# Patient Record
Sex: Male | Born: 1935 | Race: White | Hispanic: No | Marital: Married | Smoking: Former smoker
Health system: Southern US, Community
[De-identification: ages and names within clinical notes are randomized; demographics above are authoritative.]

## PROBLEM LIST (undated history)

## (undated) DIAGNOSIS — F32A Depression, unspecified: Secondary | ICD-10-CM

## (undated) DIAGNOSIS — H9190 Unspecified hearing loss, unspecified ear: Secondary | ICD-10-CM

## (undated) DIAGNOSIS — R42 Dizziness and giddiness: Secondary | ICD-10-CM

## (undated) DIAGNOSIS — I10 Essential (primary) hypertension: Secondary | ICD-10-CM

## (undated) DIAGNOSIS — F329 Major depressive disorder, single episode, unspecified: Secondary | ICD-10-CM

## (undated) DIAGNOSIS — N400 Enlarged prostate without lower urinary tract symptoms: Secondary | ICD-10-CM

## (undated) DIAGNOSIS — I4891 Unspecified atrial fibrillation: Secondary | ICD-10-CM

## (undated) DIAGNOSIS — G4730 Sleep apnea, unspecified: Secondary | ICD-10-CM

## (undated) DIAGNOSIS — R011 Cardiac murmur, unspecified: Secondary | ICD-10-CM

## (undated) DIAGNOSIS — E7800 Pure hypercholesterolemia, unspecified: Secondary | ICD-10-CM

## (undated) DIAGNOSIS — M1990 Unspecified osteoarthritis, unspecified site: Secondary | ICD-10-CM

## (undated) DIAGNOSIS — J189 Pneumonia, unspecified organism: Secondary | ICD-10-CM

## (undated) HISTORY — DX: Depression, unspecified: F32.A

## (undated) HISTORY — DX: Major depressive disorder, single episode, unspecified: F32.9

## (undated) HISTORY — DX: Unspecified atrial fibrillation: I48.91

## (undated) HISTORY — PX: COLONOSCOPY: SHX174

## (undated) HISTORY — PX: JOINT REPLACEMENT: SHX530

## (undated) HISTORY — DX: Benign prostatic hyperplasia without lower urinary tract symptoms: N40.0

## (undated) HISTORY — DX: Essential (primary) hypertension: I10

## (undated) HISTORY — DX: Pure hypercholesterolemia, unspecified: E78.00

---

## 2007-08-28 ENCOUNTER — Ambulatory Visit: Payer: Self-pay | Admitting: Internal Medicine

## 2007-11-25 ENCOUNTER — Ambulatory Visit: Payer: Self-pay | Admitting: Cardiovascular Disease

## 2008-01-21 ENCOUNTER — Emergency Department: Payer: Self-pay | Admitting: Emergency Medicine

## 2008-02-04 ENCOUNTER — Ambulatory Visit: Payer: Self-pay | Admitting: Urology

## 2008-08-10 ENCOUNTER — Ambulatory Visit: Payer: Self-pay | Admitting: Urology

## 2008-08-28 ENCOUNTER — Ambulatory Visit: Payer: Self-pay | Admitting: Urology

## 2008-09-01 ENCOUNTER — Ambulatory Visit: Payer: Self-pay | Admitting: Urology

## 2008-12-23 ENCOUNTER — Ambulatory Visit: Payer: Self-pay | Admitting: Urology

## 2010-03-31 ENCOUNTER — Ambulatory Visit: Payer: Self-pay | Admitting: Otolaryngology

## 2010-04-04 LAB — PATHOLOGY REPORT

## 2011-08-24 ENCOUNTER — Ambulatory Visit: Payer: Self-pay | Admitting: Unknown Physician Specialty

## 2011-10-05 ENCOUNTER — Ambulatory Visit: Payer: Self-pay | Admitting: Unknown Physician Specialty

## 2011-10-05 LAB — URINALYSIS, COMPLETE
Glucose,UR: NEGATIVE mg/dL (ref 0–75)
Ketone: NEGATIVE
Nitrite: NEGATIVE
Ph: 6 (ref 4.5–8.0)
Protein: NEGATIVE
RBC,UR: <1 /HPF (ref 0–5)
WBC UR: <1 /HPF (ref 0–5)

## 2011-10-05 LAB — BASIC METABOLIC PANEL
Anion Gap: 10 (ref 7–16)
BUN: 20 mg/dL — ABNORMAL HIGH (ref 7–18)
Chloride: 105 mmol/L (ref 98–107)
Co2: 26 mmol/L (ref 21–32)
Creatinine: 1.41 mg/dL — ABNORMAL HIGH (ref 0.60–1.30)
EGFR (Non-African Amer.): 52 — ABNORMAL LOW
Glucose: 90 mg/dL (ref 65–99)
Potassium: 4.4 mmol/L (ref 3.5–5.1)
Sodium: 141 mmol/L (ref 136–145)

## 2011-10-05 LAB — CBC
HCT: 42.2 % (ref 40.0–52.0)
MCHC: 33.8 g/dL (ref 32.0–36.0)
MCV: 92 fL (ref 80–100)
Platelet: 171 10*3/uL (ref 150–440)
RDW: 13.9 % (ref 11.5–14.5)
WBC: 5.8 10*3/uL (ref 3.8–10.6)

## 2011-10-12 ENCOUNTER — Ambulatory Visit: Payer: Self-pay | Admitting: Unknown Physician Specialty

## 2011-10-12 ENCOUNTER — Emergency Department: Payer: Self-pay | Admitting: Emergency Medicine

## 2011-10-12 LAB — URINALYSIS, COMPLETE
Bilirubin,UR: NEGATIVE
Glucose,UR: NEGATIVE mg/dL (ref 0–75)
Ketone: NEGATIVE
Leukocyte Esterase: NEGATIVE
Ph: 6 (ref 4.5–8.0)
Protein: NEGATIVE
RBC,UR: 22 /HPF (ref 0–5)

## 2012-01-04 ENCOUNTER — Ambulatory Visit: Payer: Self-pay | Admitting: Urology

## 2012-01-04 LAB — BASIC METABOLIC PANEL
BUN: 18 mg/dL (ref 7–18)
Calcium, Total: 9.1 mg/dL (ref 8.5–10.1)
Chloride: 106 mmol/L (ref 98–107)
Co2: 27 mmol/L (ref 21–32)
Creatinine: 1.28 mg/dL (ref 0.60–1.30)
EGFR (Non-African Amer.): 55 — ABNORMAL LOW
Glucose: 127 mg/dL — ABNORMAL HIGH (ref 65–99)
Potassium: 4.5 mmol/L (ref 3.5–5.1)

## 2012-01-16 ENCOUNTER — Ambulatory Visit: Payer: Self-pay | Admitting: Urology

## 2012-05-10 DIAGNOSIS — R5383 Other fatigue: Secondary | ICD-10-CM | POA: Insufficient documentation

## 2012-05-10 DIAGNOSIS — IMO0002 Reserved for concepts with insufficient information to code with codable children: Secondary | ICD-10-CM | POA: Insufficient documentation

## 2012-05-10 DIAGNOSIS — N401 Enlarged prostate with lower urinary tract symptoms: Secondary | ICD-10-CM | POA: Insufficient documentation

## 2012-05-10 DIAGNOSIS — N200 Calculus of kidney: Secondary | ICD-10-CM | POA: Insufficient documentation

## 2012-05-10 DIAGNOSIS — N390 Urinary tract infection, site not specified: Secondary | ICD-10-CM | POA: Insufficient documentation

## 2012-05-10 DIAGNOSIS — N529 Male erectile dysfunction, unspecified: Secondary | ICD-10-CM | POA: Insufficient documentation

## 2012-05-10 DIAGNOSIS — R5381 Other malaise: Secondary | ICD-10-CM | POA: Insufficient documentation

## 2012-05-10 DIAGNOSIS — R339 Retention of urine, unspecified: Secondary | ICD-10-CM | POA: Insufficient documentation

## 2012-05-10 DIAGNOSIS — E291 Testicular hypofunction: Secondary | ICD-10-CM | POA: Insufficient documentation

## 2012-08-20 DIAGNOSIS — R300 Dysuria: Secondary | ICD-10-CM | POA: Insufficient documentation

## 2012-09-12 ENCOUNTER — Ambulatory Visit: Payer: Self-pay | Admitting: Neurology

## 2013-06-17 ENCOUNTER — Ambulatory Visit: Payer: Self-pay | Admitting: Orthopedic Surgery

## 2014-08-28 DIAGNOSIS — M1711 Unilateral primary osteoarthritis, right knee: Secondary | ICD-10-CM | POA: Diagnosis not present

## 2014-08-28 DIAGNOSIS — M21061 Valgus deformity, not elsewhere classified, right knee: Secondary | ICD-10-CM | POA: Diagnosis not present

## 2014-09-10 DIAGNOSIS — I6990 Unspecified sequelae of unspecified cerebrovascular disease: Secondary | ICD-10-CM | POA: Diagnosis not present

## 2014-09-10 DIAGNOSIS — I059 Rheumatic mitral valve disease, unspecified: Secondary | ICD-10-CM | POA: Diagnosis not present

## 2014-09-10 DIAGNOSIS — M75100 Unspecified rotator cuff tear or rupture of unspecified shoulder, not specified as traumatic: Secondary | ICD-10-CM | POA: Diagnosis not present

## 2014-09-10 DIAGNOSIS — Z7901 Long term (current) use of anticoagulants: Secondary | ICD-10-CM | POA: Diagnosis not present

## 2014-09-10 DIAGNOSIS — I208 Other forms of angina pectoris: Secondary | ICD-10-CM | POA: Diagnosis not present

## 2014-09-24 DIAGNOSIS — G4733 Obstructive sleep apnea (adult) (pediatric): Secondary | ICD-10-CM | POA: Diagnosis not present

## 2014-10-09 DIAGNOSIS — I059 Rheumatic mitral valve disease, unspecified: Secondary | ICD-10-CM | POA: Diagnosis not present

## 2014-10-09 DIAGNOSIS — I208 Other forms of angina pectoris: Secondary | ICD-10-CM | POA: Diagnosis not present

## 2014-10-09 DIAGNOSIS — J310 Chronic rhinitis: Secondary | ICD-10-CM | POA: Diagnosis not present

## 2014-10-23 DIAGNOSIS — G4733 Obstructive sleep apnea (adult) (pediatric): Secondary | ICD-10-CM | POA: Diagnosis not present

## 2014-10-26 DIAGNOSIS — G4733 Obstructive sleep apnea (adult) (pediatric): Secondary | ICD-10-CM | POA: Diagnosis not present

## 2014-10-27 DIAGNOSIS — H2513 Age-related nuclear cataract, bilateral: Secondary | ICD-10-CM | POA: Diagnosis not present

## 2014-11-06 DIAGNOSIS — Z96619 Presence of unspecified artificial shoulder joint: Secondary | ICD-10-CM | POA: Diagnosis not present

## 2014-11-06 DIAGNOSIS — I059 Rheumatic mitral valve disease, unspecified: Secondary | ICD-10-CM | POA: Diagnosis not present

## 2014-11-06 DIAGNOSIS — I6990 Unspecified sequelae of unspecified cerebrovascular disease: Secondary | ICD-10-CM | POA: Diagnosis not present

## 2014-11-23 DIAGNOSIS — G4733 Obstructive sleep apnea (adult) (pediatric): Secondary | ICD-10-CM | POA: Diagnosis not present

## 2014-11-26 DIAGNOSIS — G4733 Obstructive sleep apnea (adult) (pediatric): Secondary | ICD-10-CM | POA: Diagnosis not present

## 2014-12-04 DIAGNOSIS — I6990 Unspecified sequelae of unspecified cerebrovascular disease: Secondary | ICD-10-CM | POA: Diagnosis not present

## 2014-12-04 DIAGNOSIS — I10 Essential (primary) hypertension: Secondary | ICD-10-CM | POA: Diagnosis not present

## 2014-12-04 DIAGNOSIS — Z96619 Presence of unspecified artificial shoulder joint: Secondary | ICD-10-CM | POA: Diagnosis not present

## 2014-12-04 DIAGNOSIS — I059 Rheumatic mitral valve disease, unspecified: Secondary | ICD-10-CM | POA: Diagnosis not present

## 2014-12-07 DIAGNOSIS — M25512 Pain in left shoulder: Secondary | ICD-10-CM | POA: Diagnosis not present

## 2014-12-08 DIAGNOSIS — H2513 Age-related nuclear cataract, bilateral: Secondary | ICD-10-CM | POA: Diagnosis not present

## 2014-12-13 NOTE — Op Note (Signed)
PATIENT NAME:  Kyle Santos, BONTEMPO MR#:  [id number] DATE OF BIRTH:  [birth year]  DATE OF PROCEDURE:  10/12/2011  PREOPERATIVE DIAGNOSIS: Right shoulder pain with rotator cuff tear, AC joint arthritis, and degenerative arthritis.   POSTOPERATIVE DIAGNOSES:  1. Rotator cuff tear, right shoulder.  2. AC joint arthritis, right shoulder.  3. Impingement syndrome, right shoulder.  4. Labral tear, degenerative, right shoulder.  5. Degenerative arthritis, right shoulder.   PROCEDURES:  1. Arthroscopic rotator cuff repair, right shoulder.  2. Arthroscopic subacromial decompression with resection of coracoacromial ligament.  3. Arthroscopic resection of the distal clavicle.  4. Arthroscopic debridement of labral tear of the glenohumeral joint and debridement of glenohumeral joint.   SURGEON: Albesa Seen, M.D.   ASSISTANT: None.   ANESTHESIA: General with scalene block.   ESTIMATED BLOOD LOSS: Negligible.   COMPLICATIONS: None.   FLUID GIVEN:  One liter of crystalloid.  IMPLANTS USED: One ArthroCare 5.5-mm speed screw.   BRIEF CLINICAL NOTE AND PATHOLOGY: The patient had persistent pain unresponsive to conservative treatment. Had documented degenerative arthritis, rotator cuff at least partial tear with impingement. Options, risks, and benefits were discussed. The patient did not want to proceed with any type of arthroplasty type procedure. The rotator cuff had a small tear in the area of maximal impingement. There was marked impingement from subacromial spurs. Also very significant degenerative arthritis with no evidence of instability.   DESCRIPTION OF PROCEDURE: Preop antibiotics, adequate general anesthesia after scalene block, beach chair position, all prominences well padded. Routine prepping and draping. Appropriate time-out was called.   The arthroscope was introduced through a routine posterior portal. Anterior portal was made with the guidance of a spinal needle. The glenohumeral joint  was inspected and the labral tear was debrided. The undersurface of the rotator cuff tear was inspected and a small tear was noted. The scope was then passed into the subacromial space. A lateral portal was created and the subacromial space was debrided. Subacromial decompression was performed with a motorized bur. This was done in the en bloc technique.   The edges of the rotator cuff tear were then debrided. One suture was placed. This was done arthroscopically. The footprint was debrided and the anchor was placed arthroscopically in routine fashion. The cuff was advanced nicely to the anchor.   The distal clavicle was then resected, 1 cm was resected. This was done from all three portals from inferior to superior. The dorsal ligaments were left intact.   The shoulder was thoroughly irrigated. The portals were closed with nylon. The area was injected with Marcaine with epinephrine.  Prior to wound closure the shoulder was taken through a range of motion. No further impingement was noted. Soft sterile dressing was applied followed by called a cold pack.  The patient was awakened and taken to the postanesthesia care unit having tolerated the procedure well.   ____________________________ Alysia Penna. Mauri Pole, MD jcc:bjt D: 10/12/2011 13:00:00 ET T: 10/12/2011 13:11:35 ET JOB#: [id number]  cc: Alysia Penna. Mauri Pole, MD, <Dictator> Alysia Penna Ahsan Esterline MD ELECTRONICALLY SIGNED 10/16/2011 13:33

## 2014-12-13 NOTE — Op Note (Signed)
PATIENT NAME:  Kyle Santos, Kyle Santos MR#:  [id number] DATE OF BIRTH:  May 23, 1936  DATE OF PROCEDURE:  01/16/2012  PREOPERATIVE DIAGNOSIS: Urethral stricture.   POSTOPERATIVE DIAGNOSIS: Urethral stricture.   PROCEDURE: Visual internal urethrotomy with steroid injection.   SURGEON: Denice Bors. Jacqlyn Larsen, MD   ANESTHESIA: Laryngeal mask airway anesthesia.   INDICATIONS: The patient is a 79 year old gentleman who recently developed urinary retention after shoulder surgery. He was treated with a Foley catheter. He developed subsequent urethral stricture throughout much of his distal urethra. He presents for visual internal urethrotomy.   PROCEDURE: After informed consent was obtained, the patient was taken to the operating room and placed in the dorsal lithotomy position under laryngeal mask airway anesthesia. The patient was then prepped and draped in the usual standard fashion. The 62 French rigid cystoscope was introduced into the urethra. Just inside the meatus stricture was encountered. The obturator for the cystoscope was utilized to dilate the distal urethral meatus. This had been previously dilated in the office and was subsequently dilated with minimal difficulty. This was advanced to approximately 1 cm into the urethra. The scope was then able to be advanced into the urethra. A gradual narrowing of the urethra was encountered. When approximate 8 to 10 French stricture, this was opened utilizing the holmium laser fiber at the 6 o'clock position. A guidewire was placed for security. Once the stricture was bypassed, multiple additional strictures were encountered occupying approximately one-half of the entire penile urethra. The stricture was incised at the 6 o'clock position throughout the entire length. As the bulbar urethra was encountered, the urethra was noted to open to a more normal size. Two large bore strictures were encountered within the bulbar urethra. The scope was then advanced into the prostatic  fossa with moderate bilobar hypertrophy noted with partial visual obstruction. Upon entering the bladder, the mucosa was inspected in its entirety with no gross mucosal lesions noted. Bilateral ureteral orifices were well visualized with no lesions noted. A moderate median lobe component was present. The scope was withdrawn back into the urethra. A needle was advanced through the scope. Multiple injections were made of Kenalog reconstituted in 5 mL of sterile saline. The total mg was 80 mg injected throughout the stricture site. Once the injection was completed, a 29 [place]ill catheter was then placed over the guidewire into the urinary bladder and was placed to gravity drainage. The patient was then returned to the supine position. He was awakened from laryngeal mask airway anesthesia. There were no problems or complications. The patient tolerated the procedure well.   ____________________________ Denice Bors. Jacqlyn Larsen, MD bsc:drc D: 01/16/2012 22:18:54 ET T: 01/17/2012 10:57:41 ET JOB#: [id number]  cc: Denice Bors. Jacqlyn Larsen, MD, <Dictator> Denice Bors Fritzie Prioleau MD ELECTRONICALLY SIGNED 01/22/2012 7:56

## 2014-12-13 NOTE — Op Note (Signed)
PATIENT NAME:  Kyle Santos, Kyle Santos MR#:  [id number] DATE OF BIRTH:  [birth year]  DATE OF PROCEDURE:  10/12/2011  PROCEDURE: Right interscalene nerve block.   INDICATION FOR THE BLOCK: To help this patient with postoperative pain about to have a right shoulder arthroscopy, subacromial decompression, and resection of distal clavicle by Dr. Albesa Seen.   ATTENDING: Marshelle Bilger P. Phineas Douglas, MD   DESCRIPTION OF PROCEDURE: The risks and benefits of the procedure and general anesthesia were discussed with the patient preoperatively in Same Day Surgery. The patient was brought to the PAC-U preoperatively. The usual monitors were applied and he was placed on nasal cannula oxygen. He was lightly sedated with a total of 3 mg of Versed intravenously. He was still awake, talkative, and in good verbal communication with [place] but much more comfortable. The usual landmarks were observed. He had a Betadine prep of his right lateral and anterior neck x3. A sterile technique was used. A Stimuplex monitor with 0.7 mA of output was used as was a 22-gauge 2-inch Stimuplex needle. The needle was passed through the right interscalene groove just under the external jugular vein. There was good right arm movement on the second pass, the first one just touching superficial bone. There was no heme, CSF, or paresthesias. He had a total of 30 mL of 0.25% bupivacaine with 1:400,000 of epinephrine injected incrementally. He tolerated the block without problem or complication. His right arm was becoming numb even prior to him going back to surgery. I am hopeful that it will help him with postoperative pain for the duration of the block.    ____________________________ Werner Lean. Phineas Douglas, MD spp:drc D: 10/12/2011 09:37:57 ET T: 10/12/2011 09:52:35 ET JOB#: [id number]  cc: Nicki Reaper P. Phineas Douglas, MD, <Dictator> Lucilla Edin MD ELECTRONICALLY SIGNED 10/12/2011 11:38

## 2014-12-17 DIAGNOSIS — M75112 Incomplete rotator cuff tear or rupture of left shoulder, not specified as traumatic: Secondary | ICD-10-CM | POA: Diagnosis not present

## 2014-12-17 DIAGNOSIS — S46812A Strain of other muscles, fascia and tendons at shoulder and upper arm level, left arm, initial encounter: Secondary | ICD-10-CM | POA: Diagnosis not present

## 2014-12-17 DIAGNOSIS — M25512 Pain in left shoulder: Secondary | ICD-10-CM | POA: Diagnosis not present

## 2014-12-17 DIAGNOSIS — M19012 Primary osteoarthritis, left shoulder: Secondary | ICD-10-CM | POA: Diagnosis not present

## 2014-12-18 DIAGNOSIS — E663 Overweight: Secondary | ICD-10-CM | POA: Diagnosis not present

## 2014-12-18 DIAGNOSIS — G2581 Restless legs syndrome: Secondary | ICD-10-CM | POA: Diagnosis not present

## 2014-12-18 DIAGNOSIS — G4733 Obstructive sleep apnea (adult) (pediatric): Secondary | ICD-10-CM | POA: Diagnosis not present

## 2015-01-05 DIAGNOSIS — G2581 Restless legs syndrome: Secondary | ICD-10-CM | POA: Diagnosis not present

## 2015-01-05 DIAGNOSIS — I059 Rheumatic mitral valve disease, unspecified: Secondary | ICD-10-CM | POA: Diagnosis not present

## 2015-01-06 DIAGNOSIS — M25512 Pain in left shoulder: Secondary | ICD-10-CM | POA: Diagnosis not present

## 2015-02-01 DIAGNOSIS — G4733 Obstructive sleep apnea (adult) (pediatric): Secondary | ICD-10-CM | POA: Diagnosis not present

## 2015-02-04 DIAGNOSIS — I059 Rheumatic mitral valve disease, unspecified: Secondary | ICD-10-CM | POA: Diagnosis not present

## 2015-02-04 DIAGNOSIS — I10 Essential (primary) hypertension: Secondary | ICD-10-CM | POA: Diagnosis not present

## 2015-02-04 DIAGNOSIS — G459 Transient cerebral ischemic attack, unspecified: Secondary | ICD-10-CM | POA: Diagnosis not present

## 2015-02-04 DIAGNOSIS — I6990 Unspecified sequelae of unspecified cerebrovascular disease: Secondary | ICD-10-CM | POA: Diagnosis not present

## 2015-03-05 DIAGNOSIS — G2581 Restless legs syndrome: Secondary | ICD-10-CM | POA: Diagnosis not present

## 2015-03-05 DIAGNOSIS — I059 Rheumatic mitral valve disease, unspecified: Secondary | ICD-10-CM | POA: Diagnosis not present

## 2015-03-05 DIAGNOSIS — G4731 Primary central sleep apnea: Secondary | ICD-10-CM | POA: Diagnosis not present

## 2015-03-05 DIAGNOSIS — E663 Overweight: Secondary | ICD-10-CM | POA: Diagnosis not present

## 2015-03-05 DIAGNOSIS — G4733 Obstructive sleep apnea (adult) (pediatric): Secondary | ICD-10-CM | POA: Diagnosis not present

## 2015-03-05 DIAGNOSIS — E784 Other hyperlipidemia: Secondary | ICD-10-CM | POA: Diagnosis not present

## 2015-03-05 DIAGNOSIS — I10 Essential (primary) hypertension: Secondary | ICD-10-CM | POA: Diagnosis not present

## 2015-03-05 DIAGNOSIS — G459 Transient cerebral ischemic attack, unspecified: Secondary | ICD-10-CM | POA: Diagnosis not present

## 2015-03-05 DIAGNOSIS — I6990 Unspecified sequelae of unspecified cerebrovascular disease: Secondary | ICD-10-CM | POA: Diagnosis not present

## 2015-03-05 DIAGNOSIS — Z125 Encounter for screening for malignant neoplasm of prostate: Secondary | ICD-10-CM | POA: Diagnosis not present

## 2015-03-05 DIAGNOSIS — R5381 Other malaise: Secondary | ICD-10-CM | POA: Diagnosis not present

## 2015-03-23 ENCOUNTER — Ambulatory Visit (INDEPENDENT_AMBULATORY_CARE_PROVIDER_SITE_OTHER): Payer: Medicare Other | Admitting: Neurology

## 2015-03-23 ENCOUNTER — Encounter: Payer: Self-pay | Admitting: Neurology

## 2015-03-23 VITALS — BP 118/78 | HR 76 | Resp 20 | Ht 67.0 in | Wt 191.0 lb

## 2015-03-23 DIAGNOSIS — G2581 Restless legs syndrome: Secondary | ICD-10-CM | POA: Diagnosis not present

## 2015-03-23 DIAGNOSIS — G250 Essential tremor: Secondary | ICD-10-CM

## 2015-03-23 MED ORDER — PROPRANOLOL HCL ER 60 MG PO CP24: 60.0000 mg | ORAL_CAPSULE | Freq: Every day | ORAL | Status: DC

## 2015-03-23 NOTE — Progress Notes (Signed)
Provider:  Larey Seat, M D  Referring Provider: Chucky May, MD Primary Care Physician:  Cletis Athens, MD  Chief Complaint  Patient presents with  . Tremors    new patient, r arm tremors, seen here 7 years ago, rm 11, with wife    HPI:  Kyle Santos is a 79 y.o. male , seen here as a referral  from Dr. Toy Care for another look at the patient's right arm and hand tremor but also evaluation of a 2 day dizziness spells of time allows. The patient had seen me and 2009 spring at the time was evaluated for unsteady gait loss of balance. His MRI which was ordered by his primary care physician then showed basically a middle cerebral artery stenosis. At the time he had fallen and that this equilibrium and ataxia had not resolved completely within 3 days he had no associated diplopia dysarthria or aphasia and he was concerned that he may have had a TIA or even stroke. He had seen Dr. Allean Found for transcranial Doppler study and a bubble study. He did have a lot of small right-to-left shunt which was considered not strong enough or fight enough to pose a higher risk of stroke. There was no need for the per patent forearm and to be closed in an interventional study.  Mrs. Gassett reports that her husband at home has occasionally to hold on to a wall or other furniture to steady his gait. He is expected to need a knee replacement in the future. He has instability with external rotation of the right leg at the knee. He has no evidence of masked face or dysphonia, no dysphagia. RLS is present. The patient also endorsed that he is a snorer but he is on a CPAP machine which has alleviated that symptom. He states that he wakes up but often feels like he could use another hour of sleep not completely restored or refreshed but much better than without CPAP. He followed in [facility] neurology and  Dr. Melrose Nakayama had placed him on topiramate and on gabapentin,  2014 , both to treat Tremor.  Pulse medications failed as they had to many cognitive side effects the patient felt drowsy and not aware or coordinated. The patient also described an interesting and affect old he remembers that his shoulder one morning fell asleep of felt as if asleep and and had no tremor. His name for a couple of hours later the tremor returned in the normal sensation of the upper extremity as well.  Followed by Dr. Garth Schlatter for depression and is using Wellbutrin, trazodone, Ativan, Prozac. Her notes also state that the patient's insurance will not pay for Lunesta which she would have preferred. Epworth sleepiness score is evident points and his fatigue severity score is 23 points,  His past medical history is positive for depression, chronic anxiety depression,  hypercholesterolemia osteoarthritis of the knee  "essential tremor" of the right arm and atrial fibrillation. He had undergone sinus surgery right shoulder replacement in 2013 and removal of a kidney stone in 2015.  PS The patient and his wife both reminded me that he had a quite significant dizzy spell about 2-3 weeks ago that changed his gait pattern for couple of days. This has now resolved. He reports not being dehydrated at the time. No slurred speech or vision changes.  It had no vertiginous character. He had equilibrium problems, held on to support. He had no pain, no fall and  no nausea , vomiting or headaches. I have no explanation for this 2 day spell in mid July.   Review of Systems: Out of a complete 14 system review, the patient complains of only the following symptoms, and all other reviewed systems are negative. RLS, knee instability, tremor, right sided.    History   Social History  . Marital Status: Married    Spouse Name: N/A  . Number of Children: N/A  . Years of Education: N/A   Occupational History  . Not on file.   Social History Main Topics  . Smoking status: Former Smoker    Quit date: 08/22/1943  . Smokeless  tobacco: Not on file  . Alcohol Use: 0.0 oz/week    0 Standard drinks or equivalent per week     Comment: occasional  . Drug Use: No  . Sexual Activity: Not on file   Other Topics Concern  . Not on file   Social History Narrative   Drinks about 2 cups of caffeine.    Family History  Problem Relation Age of Onset  . Heart disease Father     Past Medical History  Diagnosis Date  . Hypertension   . BPH (benign prostatic hyperplasia)   . Hypercholesteremia   . A-fib   . Depression     No past surgical history on file.  Current Outpatient Prescriptions  Medication Sig Dispense Refill  . buPROPion (WELLBUTRIN SR) 200 MG 12 hr tablet Take 200 mg by mouth 2 (two) times daily.    Rolena Infante [place] 450 MG CAPS Take 450 mg by mouth 2 (two) times daily.    . enalapril (VASOTEC) 5 MG tablet Take by mouth.    Marland Kitchen FLUoxetine (PROZAC) 10 MG tablet Take 10 mg by mouth daily.    Marland Kitchen LORazepam (ATIVAN) 0.5 MG tablet Take 0.5 mg by mouth every 8 (eight) hours as needed for anxiety.    . Melatonin 5 MG TABS Take by mouth.    . olmesartan (BENICAR) 20 MG tablet Take 20 mg by mouth daily.    . simvastatin (ZOCOR) 40 MG tablet 1 tablet by mouth at bedtime    . tamsulosin (FLOMAX) 0.4 MG CAPS capsule Take 0.4 mg by mouth.    . traZODone (DESYREL) 50 MG tablet Take 50 mg by mouth at bedtime as needed for sleep. Take 1/2 tablet at bedtime.    Marland Kitchen warfarin (COUMADIN) 7.5 MG tablet Take 1 tablet by mouth as directed  [Monday though Saturday]     No current facility-administered medications for this visit.    Allergies as of 03/23/2015  . (Not on File)    Vitals: BP 118/78 mmHg  Pulse 76  Resp 20  Ht 5\' 7"  (1.702 m)  Wt 191 lb (86.637 kg)  BMI 29.91 kg/m2 Last Weight:  Wt Readings from Last 1 Encounters:  03/23/15 191 lb (86.637 kg)   Last Height:   Ht Readings from Last 1 Encounters:  03/23/15 5\' 7"  (1.702 m)    Physical exam:  General: The patient is awake, alert and appears not in  acute distress. The patient is well groomed. Head: Normocephalic, atraumatic. Neck is supple. Mallampati 3 , neck circumference: 17.5 inches.  Cardiovascular:  Regular rate and rhythm  without  murmurs or carotid bruit, and without distended neck veins. Respiratory: Lungs are clear to auscultation. Skin:  Without evidence of edema, or rash Trunk: BMI is elevated and patient  has normal posture.  Neurologic exam : The patient is  awake and alert, oriented to place and time.  Memory subjective   described as intact. There is a normal attention span & concentration ability. Speech is fluent without   dysarthria, dysphonia or aphasia. Mood and affect are appropriate.  Cranial nerves: Pupils are equal and briskly reactive to light. Funduscopic exam without  evidence of pallor or edema.  Extraocular movements  in vertical and horizontal planes intact and with coarse saccadic eye movements. No  nystagmus.  Visual fields by finger perimetry are intact. Hearing to finger rub intact.  Facial sensation intact to fine touch.  Facial motor strength is symmetric and tongue and uvula move midline.  There is a jaw tremor , very mild.  No reported claudication at the jaw.  No masked facial expression.  Tongue protrusion into either cheek is normal. Shoulder shrug is normal.  There is no crepitus over either shoulder and there is no excessive tenderness over the nuchal musculature, the range of motion for the neck is intact.  Motor exam: Normal tone, muscle bulk and symmetric  strength in all extremities. No cog wheeling.   Sensory:  Fine touch, pinprick and vibration were tested in all extremities.  Fine filament touch and vibration are decreased in the bottom of either foot.   Proprioception was normal.  Coordination: Rapid alternating movements in the fingers/hands were normal.  Finger-to-nose maneuver normal without evidence of ataxia, dysmetria , but there is a right sided tremor.  Gait and station:  Patient walks without assistive device and is able unassisted to climb up to the exam table.  Strength within normal limits. Stance is stable and normal.  Tandem gait is unfragmented. Romberg testing deferred.   Deep tendon reflexes: in the  upper and lower extremities are symmetric and intact. Babinski maneuver response is  Downgoing.    Assessment:  After physical and neurologic examination, review of laboratory studies, imaging, neurophysiology testing and pre-existing records, assessment is that of :  The patient has an essential tremor in spite of the resting nature and asymmetry of the tremor. He does have no associated parkinsonian signs and given that he has suffered from this tremor for over 7 years or even longer I would expect either cogwheeling a masked face dysphonia or dysphagia to be present. I'm not sure how much of his gait is related to the bony degeneration. I also don't know if Wellbutrin especially contributes to some degree to his tremor. There is a mild tremor over the jaw and is a little bit of tremor in the left hand.  Patient reminded me that the tremor was present prior to the initiation of Wellbutrin.  Plan:  Treatment plan and additional workup :  More than 50% of today's visit time of 45 minutes was dedicated to the discussion of the symptoms and the differential diagnoses. Also to the treatment options. The patient was already diagnosed with an essential tremor, [place] his tremor is a typical due to its asymmetry. We are discussing the medication regimen that may help to control the tremor and that also medication meant to treat Parkinson's disease can be useful in the treatment of essential tremors.  The patient reports that he is on medication, enalapril, Benicar, times or oozing, warfarin, simvastatin, lorazepam, trazodone, fluoxetine, melatonin, vitamin D and B12.  Propranolol XR at night initiated.   MRI of the brain.  Iron studies to evaluate for RLS,  ferritin, TIBC and iron. CMET and CBC were obtained with his PCP, Dr. Lavera Guise.  Will ask for  release of this result.          Asencion Partridge Sharae Zappulla MD 03/23/2015

## 2015-03-23 NOTE — Patient Instructions (Addendum)
Compensation Strategies for Tremors  When eating, try the following  Eat out of bowls, divided plates, or use a plate guard (available at a medical supply store) and eat with a spoon so that you have an edge to scoop up food.  Try raising your plate so that there is less distance between the plate and mouth.Try stabilizing elbows on the tables or against your body.  Use utensil with built-up/larger grips as they are easier to hold.  When writing, try the following:  Stabilize forearm on the table.  Take your time as rushing/being stressed can increase tremors.  Try a felt-tipped pen, it does not glide as much.  Avoid gel pens ( they move to much ).  Consider using pre-printed labels with your name and address (carry them with you when you go out) or you can get stamps with your address or signature on it.  Use a small tape recorder to record messages/reminders for yourself.  Use pens with bigger grips.  When brushing your teeth, putting on make-up, or styling hair, try the following:  Use an electric toothbrush.  Use items with built-up grips.  Stabilize your elbows against your body or on the counter.  Use long-handled brushes/combs.  Use a hair dryer with a stand.  In general:  Avoid stress, fatigue or rushing as this can increase tremors.  Sit down for activities that require more control/coordination.  Perform "flicks". Restless Legs Syndrome Restless legs syndrome is a movement disorder. It may also be called a sensorimotor disorder.  CAUSES  No one knows what specifically causes restless legs syndrome, but it tends to run in families. It is also more common in people with low iron, in pregnancy, in people who need dialysis, and those with nerve damage (neuropathy).Some medications may make restless legs syndrome worse.Those medications include drugs to treat high blood pressure, some heart conditions, nausea, colds, allergies, and depression. SYMPTOMS Symptoms  include uncomfortable sensations in the legs. These leg sensations are worse during periods of inactivity or rest. They are also worse while sitting or lying down. Individuals that have the disorder describe sensations in the legs that feel like: Pulling. Drawing. Crawling. Worming. Boring. Tingling. Pins and needles. Prickling. Pain. The sensations are usually accompanied by an overwhelming urge to move the legs. Sudden muscle jerks may also occur. Movement provides temporary relief from the discomfort. In rare cases, the arms may also be affected. Symptoms may interfere with going to sleep (sleep onset insomnia). Restless legs syndrome may also be related to periodic limb movement disorder (PLMD). PLMD is another more common motor disorder. It also causes interrupted sleep. The symptoms from PLMD usually occur most often when you are awake. TREATMENT  Treatment for restless legs syndrome is symptomatic. This means that the symptoms are treated.  Massage and cold compresses may provide temporary relief. Walk, stretch, or take a cold or hot bath. Get regular exercise and a good night's sleep. Avoid caffeine, alcohol, nicotine, and medications that can make it worse. Do activities that provide mental stimulation like discussions, needlework, and video games. These may be helpful if you are not able to walk or stretch. Some medications are effective in relieving the symptoms. However, many of these medications have side effects. Ask your caregiver about medications that may help your symptoms. Correcting iron deficiency may improve symptoms for some patients. Document Released: 07/28/2002 Document Revised: 12/22/2013 Document Reviewed: 11/03/2010 [facility] Patient Information 2015 [place], [place]. This information is not intended to replace advice given to  to you by your health care provider. Make sure you discuss any questions you have with your health care provider.  

## 2015-03-29 ENCOUNTER — Ambulatory Visit
Admission: RE | Admit: 2015-03-29 | Discharge: 2015-03-29 | Disposition: A | Discharge status: Home / Self Care | Payer: Medicare Other | Source: Ambulatory Visit | Attending: Neurology | Admitting: Neurology

## 2015-03-29 ENCOUNTER — Ambulatory Visit: Payer: Medicare Other

## 2015-03-29 DIAGNOSIS — G250 Essential tremor: Secondary | ICD-10-CM

## 2015-03-29 DIAGNOSIS — G2581 Restless legs syndrome: Secondary | ICD-10-CM

## 2015-03-29 DIAGNOSIS — R269 Unspecified abnormalities of gait and mobility: Secondary | ICD-10-CM | POA: Diagnosis not present

## 2015-04-01 ENCOUNTER — Telehealth: Payer: Self-pay

## 2015-04-01 NOTE — Telephone Encounter (Signed)
-----   Message from Larey Seat, MD sent at 03/31/2015  7:02 PM EDT ----- Normal brain MRI - please call patient. No stroke, no significant  white matter disease . No reason for his tremor seen on the MRI. CD  His PCP< Dr Lavera Guise is not part of our data base , please send him a paper copy or fax the report. CD

## 2015-04-01 NOTE — Telephone Encounter (Signed)
Called to advise pt of MRI results. Pt's wife answered, pt is not up yet, but she will ask the pt to give me a call when he gets up.

## 2015-04-01 NOTE — Telephone Encounter (Signed)
Spoke to pt. Informed him that his MRI brain appears normal, and we don't see a reason for his tremor on the MRI. Pt verbalized understanding. He is seeing Dr. Lavera Guise tomorrow. Faxed a copy of MRI to Dr. Lavera Guise, [phone]

## 2015-04-02 DIAGNOSIS — I6990 Unspecified sequelae of unspecified cerebrovascular disease: Secondary | ICD-10-CM | POA: Diagnosis not present

## 2015-04-02 DIAGNOSIS — Z96619 Presence of unspecified artificial shoulder joint: Secondary | ICD-10-CM | POA: Diagnosis not present

## 2015-04-02 DIAGNOSIS — I059 Rheumatic mitral valve disease, unspecified: Secondary | ICD-10-CM | POA: Diagnosis not present

## 2015-04-02 DIAGNOSIS — M75100 Unspecified rotator cuff tear or rupture of unspecified shoulder, not specified as traumatic: Secondary | ICD-10-CM | POA: Diagnosis not present

## 2015-04-30 DIAGNOSIS — M1711 Unilateral primary osteoarthritis, right knee: Secondary | ICD-10-CM | POA: Diagnosis not present

## 2015-05-07 DIAGNOSIS — G459 Transient cerebral ischemic attack, unspecified: Secondary | ICD-10-CM | POA: Diagnosis not present

## 2015-05-07 DIAGNOSIS — I059 Rheumatic mitral valve disease, unspecified: Secondary | ICD-10-CM | POA: Diagnosis not present

## 2015-05-07 DIAGNOSIS — G4731 Primary central sleep apnea: Secondary | ICD-10-CM | POA: Diagnosis not present

## 2015-05-07 DIAGNOSIS — E784 Other hyperlipidemia: Secondary | ICD-10-CM | POA: Diagnosis not present

## 2015-05-07 DIAGNOSIS — I6990 Unspecified sequelae of unspecified cerebrovascular disease: Secondary | ICD-10-CM | POA: Diagnosis not present

## 2015-05-20 DIAGNOSIS — M1711 Unilateral primary osteoarthritis, right knee: Secondary | ICD-10-CM | POA: Diagnosis not present

## 2015-05-25 DIAGNOSIS — M1711 Unilateral primary osteoarthritis, right knee: Secondary | ICD-10-CM | POA: Diagnosis not present

## 2015-05-25 DIAGNOSIS — Z01818 Encounter for other preprocedural examination: Secondary | ICD-10-CM | POA: Diagnosis not present

## 2015-06-04 DIAGNOSIS — G459 Transient cerebral ischemic attack, unspecified: Secondary | ICD-10-CM | POA: Diagnosis not present

## 2015-06-04 DIAGNOSIS — Z23 Encounter for immunization: Secondary | ICD-10-CM | POA: Diagnosis not present

## 2015-06-04 DIAGNOSIS — I10 Essential (primary) hypertension: Secondary | ICD-10-CM | POA: Diagnosis not present

## 2015-06-04 DIAGNOSIS — R413 Other amnesia: Secondary | ICD-10-CM | POA: Diagnosis not present

## 2015-06-04 DIAGNOSIS — I059 Rheumatic mitral valve disease, unspecified: Secondary | ICD-10-CM | POA: Diagnosis not present

## 2015-06-09 DIAGNOSIS — G4733 Obstructive sleep apnea (adult) (pediatric): Secondary | ICD-10-CM | POA: Diagnosis not present

## 2015-06-15 DIAGNOSIS — Z87891 Personal history of nicotine dependence: Secondary | ICD-10-CM | POA: Diagnosis not present

## 2015-06-15 DIAGNOSIS — F3289 Other specified depressive episodes: Secondary | ICD-10-CM | POA: Diagnosis not present

## 2015-06-15 DIAGNOSIS — E785 Hyperlipidemia, unspecified: Secondary | ICD-10-CM | POA: Diagnosis not present

## 2015-06-15 DIAGNOSIS — Z7901 Long term (current) use of anticoagulants: Secondary | ICD-10-CM | POA: Diagnosis not present

## 2015-06-15 DIAGNOSIS — F32A Depression, unspecified: Secondary | ICD-10-CM | POA: Insufficient documentation

## 2015-06-15 DIAGNOSIS — N183 Chronic kidney disease, stage 3 (moderate): Secondary | ICD-10-CM | POA: Diagnosis not present

## 2015-06-15 DIAGNOSIS — M1711 Unilateral primary osteoarthritis, right knee: Secondary | ICD-10-CM | POA: Diagnosis not present

## 2015-06-15 DIAGNOSIS — G4733 Obstructive sleep apnea (adult) (pediatric): Secondary | ICD-10-CM | POA: Insufficient documentation

## 2015-06-15 DIAGNOSIS — F329 Major depressive disorder, single episode, unspecified: Secondary | ICD-10-CM | POA: Insufficient documentation

## 2015-06-15 DIAGNOSIS — Z471 Aftercare following joint replacement surgery: Secondary | ICD-10-CM | POA: Diagnosis not present

## 2015-06-15 DIAGNOSIS — Z8249 Family history of ischemic heart disease and other diseases of the circulatory system: Secondary | ICD-10-CM | POA: Diagnosis not present

## 2015-06-15 DIAGNOSIS — I10 Essential (primary) hypertension: Secondary | ICD-10-CM | POA: Diagnosis not present

## 2015-06-15 DIAGNOSIS — N401 Enlarged prostate with lower urinary tract symptoms: Secondary | ICD-10-CM | POA: Diagnosis not present

## 2015-06-15 DIAGNOSIS — F418 Other specified anxiety disorders: Secondary | ICD-10-CM | POA: Diagnosis not present

## 2015-06-15 DIAGNOSIS — I480 Paroxysmal atrial fibrillation: Secondary | ICD-10-CM | POA: Diagnosis not present

## 2015-06-15 DIAGNOSIS — Z96651 Presence of right artificial knee joint: Secondary | ICD-10-CM | POA: Insufficient documentation

## 2015-06-15 DIAGNOSIS — I129 Hypertensive chronic kidney disease with stage 1 through stage 4 chronic kidney disease, or unspecified chronic kidney disease: Secondary | ICD-10-CM | POA: Diagnosis not present

## 2015-06-15 DIAGNOSIS — N400 Enlarged prostate without lower urinary tract symptoms: Secondary | ICD-10-CM | POA: Diagnosis not present

## 2015-06-15 DIAGNOSIS — G4730 Sleep apnea, unspecified: Secondary | ICD-10-CM | POA: Diagnosis not present

## 2015-06-23 ENCOUNTER — Ambulatory Visit: Payer: Medicare Other | Admitting: Neurology

## 2015-06-23 DIAGNOSIS — Z471 Aftercare following joint replacement surgery: Secondary | ICD-10-CM | POA: Diagnosis not present

## 2015-06-23 DIAGNOSIS — M1711 Unilateral primary osteoarthritis, right knee: Secondary | ICD-10-CM | POA: Diagnosis not present

## 2015-06-23 DIAGNOSIS — Z96651 Presence of right artificial knee joint: Secondary | ICD-10-CM | POA: Diagnosis not present

## 2015-06-24 DIAGNOSIS — M1711 Unilateral primary osteoarthritis, right knee: Secondary | ICD-10-CM | POA: Diagnosis not present

## 2015-06-24 DIAGNOSIS — Z96651 Presence of right artificial knee joint: Secondary | ICD-10-CM | POA: Diagnosis not present

## 2015-06-24 DIAGNOSIS — Z471 Aftercare following joint replacement surgery: Secondary | ICD-10-CM | POA: Diagnosis not present

## 2015-06-25 DIAGNOSIS — M1711 Unilateral primary osteoarthritis, right knee: Secondary | ICD-10-CM | POA: Diagnosis not present

## 2015-06-25 DIAGNOSIS — Z471 Aftercare following joint replacement surgery: Secondary | ICD-10-CM | POA: Diagnosis not present

## 2015-06-25 DIAGNOSIS — Z96651 Presence of right artificial knee joint: Secondary | ICD-10-CM | POA: Diagnosis not present

## 2015-06-28 DIAGNOSIS — Z471 Aftercare following joint replacement surgery: Secondary | ICD-10-CM | POA: Diagnosis not present

## 2015-06-28 DIAGNOSIS — Z96651 Presence of right artificial knee joint: Secondary | ICD-10-CM | POA: Diagnosis not present

## 2015-06-28 DIAGNOSIS — M1711 Unilateral primary osteoarthritis, right knee: Secondary | ICD-10-CM | POA: Diagnosis not present

## 2015-06-30 DIAGNOSIS — Z471 Aftercare following joint replacement surgery: Secondary | ICD-10-CM | POA: Diagnosis not present

## 2015-06-30 DIAGNOSIS — M1711 Unilateral primary osteoarthritis, right knee: Secondary | ICD-10-CM | POA: Diagnosis not present

## 2015-06-30 DIAGNOSIS — Z96651 Presence of right artificial knee joint: Secondary | ICD-10-CM | POA: Diagnosis not present

## 2015-07-02 DIAGNOSIS — M1711 Unilateral primary osteoarthritis, right knee: Secondary | ICD-10-CM | POA: Diagnosis not present

## 2015-07-02 DIAGNOSIS — Z96651 Presence of right artificial knee joint: Secondary | ICD-10-CM | POA: Diagnosis not present

## 2015-07-02 DIAGNOSIS — Z0000 Encounter for general adult medical examination without abnormal findings: Secondary | ICD-10-CM | POA: Diagnosis not present

## 2015-07-02 DIAGNOSIS — E784 Other hyperlipidemia: Secondary | ICD-10-CM | POA: Diagnosis not present

## 2015-07-02 DIAGNOSIS — Z471 Aftercare following joint replacement surgery: Secondary | ICD-10-CM | POA: Diagnosis not present

## 2015-07-02 DIAGNOSIS — G459 Transient cerebral ischemic attack, unspecified: Secondary | ICD-10-CM | POA: Diagnosis not present

## 2015-07-05 DIAGNOSIS — Z96651 Presence of right artificial knee joint: Secondary | ICD-10-CM | POA: Diagnosis not present

## 2015-07-07 DIAGNOSIS — Z96651 Presence of right artificial knee joint: Secondary | ICD-10-CM | POA: Diagnosis not present

## 2015-07-10 DIAGNOSIS — G4733 Obstructive sleep apnea (adult) (pediatric): Secondary | ICD-10-CM | POA: Diagnosis not present

## 2015-07-19 DIAGNOSIS — Z96651 Presence of right artificial knee joint: Secondary | ICD-10-CM | POA: Diagnosis not present

## 2015-07-22 DIAGNOSIS — Z96651 Presence of right artificial knee joint: Secondary | ICD-10-CM | POA: Diagnosis not present

## 2015-07-27 DIAGNOSIS — Z96651 Presence of right artificial knee joint: Secondary | ICD-10-CM | POA: Diagnosis not present

## 2015-07-29 DIAGNOSIS — Z96651 Presence of right artificial knee joint: Secondary | ICD-10-CM | POA: Diagnosis not present

## 2015-07-30 DIAGNOSIS — Z96651 Presence of right artificial knee joint: Secondary | ICD-10-CM | POA: Diagnosis not present

## 2015-08-03 DIAGNOSIS — G4733 Obstructive sleep apnea (adult) (pediatric): Secondary | ICD-10-CM | POA: Diagnosis not present

## 2015-08-03 DIAGNOSIS — Z96651 Presence of right artificial knee joint: Secondary | ICD-10-CM | POA: Diagnosis not present

## 2015-08-05 DIAGNOSIS — Z96651 Presence of right artificial knee joint: Secondary | ICD-10-CM | POA: Diagnosis not present

## 2015-08-06 DIAGNOSIS — I059 Rheumatic mitral valve disease, unspecified: Secondary | ICD-10-CM | POA: Diagnosis not present

## 2015-08-06 DIAGNOSIS — G459 Transient cerebral ischemic attack, unspecified: Secondary | ICD-10-CM | POA: Diagnosis not present

## 2015-08-06 DIAGNOSIS — G4731 Primary central sleep apnea: Secondary | ICD-10-CM | POA: Diagnosis not present

## 2015-08-06 DIAGNOSIS — R634 Abnormal weight loss: Secondary | ICD-10-CM | POA: Diagnosis not present

## 2015-08-09 DIAGNOSIS — G4733 Obstructive sleep apnea (adult) (pediatric): Secondary | ICD-10-CM | POA: Diagnosis not present

## 2015-08-10 DIAGNOSIS — Z96651 Presence of right artificial knee joint: Secondary | ICD-10-CM | POA: Diagnosis not present

## 2015-08-12 DIAGNOSIS — Z96651 Presence of right artificial knee joint: Secondary | ICD-10-CM | POA: Diagnosis not present

## 2015-09-06 DIAGNOSIS — R413 Other amnesia: Secondary | ICD-10-CM | POA: Diagnosis not present

## 2015-09-06 DIAGNOSIS — I059 Rheumatic mitral valve disease, unspecified: Secondary | ICD-10-CM | POA: Diagnosis not present

## 2015-09-06 DIAGNOSIS — I6990 Unspecified sequelae of unspecified cerebrovascular disease: Secondary | ICD-10-CM | POA: Diagnosis not present

## 2015-09-06 DIAGNOSIS — G459 Transient cerebral ischemic attack, unspecified: Secondary | ICD-10-CM | POA: Diagnosis not present

## 2015-09-09 DIAGNOSIS — G4733 Obstructive sleep apnea (adult) (pediatric): Secondary | ICD-10-CM | POA: Diagnosis not present

## 2015-10-08 DIAGNOSIS — I9589 Other hypotension: Secondary | ICD-10-CM | POA: Diagnosis not present

## 2015-10-08 DIAGNOSIS — I059 Rheumatic mitral valve disease, unspecified: Secondary | ICD-10-CM | POA: Diagnosis not present

## 2015-10-08 DIAGNOSIS — J00 Acute nasopharyngitis [common cold]: Secondary | ICD-10-CM | POA: Diagnosis not present

## 2015-10-08 DIAGNOSIS — R413 Other amnesia: Secondary | ICD-10-CM | POA: Diagnosis not present

## 2015-10-10 DIAGNOSIS — G4733 Obstructive sleep apnea (adult) (pediatric): Secondary | ICD-10-CM | POA: Diagnosis not present

## 2015-10-29 DIAGNOSIS — G4731 Primary central sleep apnea: Secondary | ICD-10-CM | POA: Diagnosis not present

## 2015-10-29 DIAGNOSIS — I059 Rheumatic mitral valve disease, unspecified: Secondary | ICD-10-CM | POA: Diagnosis not present

## 2015-10-29 DIAGNOSIS — I6990 Unspecified sequelae of unspecified cerebrovascular disease: Secondary | ICD-10-CM | POA: Diagnosis not present

## 2015-10-29 DIAGNOSIS — R413 Other amnesia: Secondary | ICD-10-CM | POA: Diagnosis not present

## 2015-11-07 DIAGNOSIS — G4733 Obstructive sleep apnea (adult) (pediatric): Secondary | ICD-10-CM | POA: Diagnosis not present

## 2015-11-29 DIAGNOSIS — I6990 Unspecified sequelae of unspecified cerebrovascular disease: Secondary | ICD-10-CM | POA: Diagnosis not present

## 2015-11-29 DIAGNOSIS — G459 Transient cerebral ischemic attack, unspecified: Secondary | ICD-10-CM | POA: Diagnosis not present

## 2015-11-29 DIAGNOSIS — I059 Rheumatic mitral valve disease, unspecified: Secondary | ICD-10-CM | POA: Diagnosis not present

## 2015-11-29 DIAGNOSIS — G4731 Primary central sleep apnea: Secondary | ICD-10-CM | POA: Diagnosis not present

## 2015-12-08 DIAGNOSIS — G4733 Obstructive sleep apnea (adult) (pediatric): Secondary | ICD-10-CM | POA: Diagnosis not present

## 2015-12-30 DIAGNOSIS — R5381 Other malaise: Secondary | ICD-10-CM | POA: Diagnosis not present

## 2015-12-30 DIAGNOSIS — G459 Transient cerebral ischemic attack, unspecified: Secondary | ICD-10-CM | POA: Diagnosis not present

## 2015-12-30 DIAGNOSIS — I059 Rheumatic mitral valve disease, unspecified: Secondary | ICD-10-CM | POA: Diagnosis not present

## 2016-01-10 DIAGNOSIS — I348 Other nonrheumatic mitral valve disorders: Secondary | ICD-10-CM | POA: Diagnosis not present

## 2016-01-10 DIAGNOSIS — N319 Neuromuscular dysfunction of bladder, unspecified: Secondary | ICD-10-CM | POA: Diagnosis not present

## 2016-01-10 DIAGNOSIS — E785 Hyperlipidemia, unspecified: Secondary | ICD-10-CM | POA: Diagnosis not present

## 2016-01-10 DIAGNOSIS — I480 Paroxysmal atrial fibrillation: Secondary | ICD-10-CM | POA: Diagnosis not present

## 2016-01-10 DIAGNOSIS — I10 Essential (primary) hypertension: Secondary | ICD-10-CM | POA: Diagnosis not present

## 2016-01-10 DIAGNOSIS — Z1389 Encounter for screening for other disorder: Secondary | ICD-10-CM | POA: Diagnosis not present

## 2016-01-31 DIAGNOSIS — I6990 Unspecified sequelae of unspecified cerebrovascular disease: Secondary | ICD-10-CM | POA: Diagnosis not present

## 2016-01-31 DIAGNOSIS — I9589 Other hypotension: Secondary | ICD-10-CM | POA: Diagnosis not present

## 2016-01-31 DIAGNOSIS — I059 Rheumatic mitral valve disease, unspecified: Secondary | ICD-10-CM | POA: Diagnosis not present

## 2016-01-31 DIAGNOSIS — I10 Essential (primary) hypertension: Secondary | ICD-10-CM | POA: Diagnosis not present

## 2016-02-07 DIAGNOSIS — E784 Other hyperlipidemia: Secondary | ICD-10-CM | POA: Diagnosis not present

## 2016-02-07 DIAGNOSIS — I059 Rheumatic mitral valve disease, unspecified: Secondary | ICD-10-CM | POA: Diagnosis not present

## 2016-02-07 DIAGNOSIS — I6990 Unspecified sequelae of unspecified cerebrovascular disease: Secondary | ICD-10-CM | POA: Diagnosis not present

## 2016-02-07 DIAGNOSIS — I9589 Other hypotension: Secondary | ICD-10-CM | POA: Diagnosis not present

## 2016-03-03 DIAGNOSIS — G4731 Primary central sleep apnea: Secondary | ICD-10-CM | POA: Diagnosis not present

## 2016-03-03 DIAGNOSIS — I059 Rheumatic mitral valve disease, unspecified: Secondary | ICD-10-CM | POA: Diagnosis not present

## 2016-03-03 DIAGNOSIS — R5381 Other malaise: Secondary | ICD-10-CM | POA: Diagnosis not present

## 2016-03-03 DIAGNOSIS — I6990 Unspecified sequelae of unspecified cerebrovascular disease: Secondary | ICD-10-CM | POA: Diagnosis not present

## 2016-04-07 ENCOUNTER — Other Ambulatory Visit: Payer: Self-pay | Admitting: Cardiology

## 2016-04-07 ENCOUNTER — Ambulatory Visit
Admission: RE | Admit: 2016-04-07 | Discharge: 2016-04-07 | Disposition: A | Discharge status: Home / Self Care | Payer: Medicare Other | Source: Ambulatory Visit | Attending: Cardiology | Admitting: Cardiology

## 2016-04-07 ENCOUNTER — Ambulatory Visit
Admission: RE | Admit: 2016-04-07 | Discharge: 2016-04-07 | Disposition: A | Discharge status: Home / Self Care | Payer: Medicare Other | Source: Ambulatory Visit | Attending: Internal Medicine | Admitting: Internal Medicine

## 2016-04-07 DIAGNOSIS — W19XXXA Unspecified fall, initial encounter: Secondary | ICD-10-CM

## 2016-04-07 DIAGNOSIS — I4892 Unspecified atrial flutter: Secondary | ICD-10-CM | POA: Diagnosis not present

## 2016-04-07 DIAGNOSIS — I739 Peripheral vascular disease, unspecified: Secondary | ICD-10-CM | POA: Diagnosis not present

## 2016-04-07 DIAGNOSIS — G4731 Primary central sleep apnea: Secondary | ICD-10-CM | POA: Diagnosis not present

## 2016-04-07 DIAGNOSIS — I059 Rheumatic mitral valve disease, unspecified: Secondary | ICD-10-CM | POA: Diagnosis not present

## 2016-04-07 DIAGNOSIS — Z96619 Presence of unspecified artificial shoulder joint: Secondary | ICD-10-CM | POA: Diagnosis not present

## 2016-04-07 DIAGNOSIS — S79911A Unspecified injury of right hip, initial encounter: Secondary | ICD-10-CM | POA: Diagnosis not present

## 2016-04-07 DIAGNOSIS — M25551 Pain in right hip: Secondary | ICD-10-CM | POA: Insufficient documentation

## 2016-04-07 DIAGNOSIS — I10 Essential (primary) hypertension: Secondary | ICD-10-CM | POA: Diagnosis not present

## 2016-04-07 DIAGNOSIS — I4891 Unspecified atrial fibrillation: Secondary | ICD-10-CM | POA: Diagnosis not present

## 2016-04-14 DIAGNOSIS — R42 Dizziness and giddiness: Secondary | ICD-10-CM | POA: Diagnosis not present

## 2016-04-14 DIAGNOSIS — I059 Rheumatic mitral valve disease, unspecified: Secondary | ICD-10-CM | POA: Diagnosis not present

## 2016-04-14 DIAGNOSIS — R55 Syncope and collapse: Secondary | ICD-10-CM | POA: Diagnosis not present

## 2016-04-17 ENCOUNTER — Other Ambulatory Visit: Payer: Self-pay | Admitting: Internal Medicine

## 2016-04-17 DIAGNOSIS — R55 Syncope and collapse: Secondary | ICD-10-CM

## 2016-04-18 DIAGNOSIS — R0602 Shortness of breath: Secondary | ICD-10-CM | POA: Diagnosis not present

## 2016-04-18 DIAGNOSIS — I6990 Unspecified sequelae of unspecified cerebrovascular disease: Secondary | ICD-10-CM | POA: Diagnosis not present

## 2016-04-18 DIAGNOSIS — G4731 Primary central sleep apnea: Secondary | ICD-10-CM | POA: Diagnosis not present

## 2016-04-18 DIAGNOSIS — I059 Rheumatic mitral valve disease, unspecified: Secondary | ICD-10-CM | POA: Diagnosis not present

## 2016-04-20 DIAGNOSIS — R0602 Shortness of breath: Secondary | ICD-10-CM | POA: Diagnosis not present

## 2016-04-20 DIAGNOSIS — I059 Rheumatic mitral valve disease, unspecified: Secondary | ICD-10-CM | POA: Diagnosis not present

## 2016-04-20 DIAGNOSIS — R42 Dizziness and giddiness: Secondary | ICD-10-CM | POA: Diagnosis not present

## 2016-04-20 DIAGNOSIS — I6990 Unspecified sequelae of unspecified cerebrovascular disease: Secondary | ICD-10-CM | POA: Diagnosis not present

## 2016-04-26 ENCOUNTER — Ambulatory Visit
Admission: RE | Admit: 2016-04-26 | Discharge: 2016-04-26 | Disposition: A | Discharge status: Home / Self Care | Payer: Medicare Other | Source: Ambulatory Visit | Attending: Internal Medicine | Admitting: Internal Medicine

## 2016-04-26 DIAGNOSIS — R55 Syncope and collapse: Secondary | ICD-10-CM | POA: Insufficient documentation

## 2016-04-26 DIAGNOSIS — S0990XA Unspecified injury of head, initial encounter: Secondary | ICD-10-CM | POA: Diagnosis not present

## 2016-05-19 DIAGNOSIS — G4731 Primary central sleep apnea: Secondary | ICD-10-CM | POA: Diagnosis not present

## 2016-05-19 DIAGNOSIS — I6990 Unspecified sequelae of unspecified cerebrovascular disease: Secondary | ICD-10-CM | POA: Diagnosis not present

## 2016-05-19 DIAGNOSIS — Z23 Encounter for immunization: Secondary | ICD-10-CM | POA: Diagnosis not present

## 2016-05-19 DIAGNOSIS — I059 Rheumatic mitral valve disease, unspecified: Secondary | ICD-10-CM | POA: Diagnosis not present

## 2016-06-09 DIAGNOSIS — I4892 Unspecified atrial flutter: Secondary | ICD-10-CM | POA: Diagnosis not present

## 2016-06-09 DIAGNOSIS — G4711 Idiopathic hypersomnia with long sleep time: Secondary | ICD-10-CM | POA: Diagnosis not present

## 2016-06-09 DIAGNOSIS — I4891 Unspecified atrial fibrillation: Secondary | ICD-10-CM | POA: Diagnosis not present

## 2016-06-09 DIAGNOSIS — I208 Other forms of angina pectoris: Secondary | ICD-10-CM | POA: Diagnosis not present

## 2016-07-07 DIAGNOSIS — I498 Other specified cardiac arrhythmias: Secondary | ICD-10-CM | POA: Diagnosis not present

## 2016-07-07 DIAGNOSIS — H6090 Unspecified otitis externa, unspecified ear: Secondary | ICD-10-CM | POA: Diagnosis not present

## 2016-07-07 DIAGNOSIS — M75100 Unspecified rotator cuff tear or rupture of unspecified shoulder, not specified as traumatic: Secondary | ICD-10-CM | POA: Diagnosis not present

## 2016-08-04 DIAGNOSIS — J00 Acute nasopharyngitis [common cold]: Secondary | ICD-10-CM | POA: Diagnosis not present

## 2016-08-04 DIAGNOSIS — G4731 Primary central sleep apnea: Secondary | ICD-10-CM | POA: Diagnosis not present

## 2016-08-04 DIAGNOSIS — R000 Tachycardia, unspecified: Secondary | ICD-10-CM | POA: Diagnosis not present

## 2016-08-04 DIAGNOSIS — G459 Transient cerebral ischemic attack, unspecified: Secondary | ICD-10-CM | POA: Diagnosis not present

## 2016-08-04 DIAGNOSIS — I498 Other specified cardiac arrhythmias: Secondary | ICD-10-CM | POA: Diagnosis not present

## 2016-09-11 DIAGNOSIS — I6990 Unspecified sequelae of unspecified cerebrovascular disease: Secondary | ICD-10-CM | POA: Diagnosis not present

## 2016-09-11 DIAGNOSIS — R413 Other amnesia: Secondary | ICD-10-CM | POA: Diagnosis not present

## 2016-09-11 DIAGNOSIS — I059 Rheumatic mitral valve disease, unspecified: Secondary | ICD-10-CM | POA: Diagnosis not present

## 2016-09-11 DIAGNOSIS — G4711 Idiopathic hypersomnia with long sleep time: Secondary | ICD-10-CM | POA: Diagnosis not present

## 2016-10-06 DIAGNOSIS — I10 Essential (primary) hypertension: Secondary | ICD-10-CM | POA: Diagnosis not present

## 2016-10-06 DIAGNOSIS — I9589 Other hypotension: Secondary | ICD-10-CM | POA: Diagnosis not present

## 2016-10-06 DIAGNOSIS — R000 Tachycardia, unspecified: Secondary | ICD-10-CM | POA: Diagnosis not present

## 2016-10-06 DIAGNOSIS — I059 Rheumatic mitral valve disease, unspecified: Secondary | ICD-10-CM | POA: Diagnosis not present

## 2016-10-20 DIAGNOSIS — I10 Essential (primary) hypertension: Secondary | ICD-10-CM | POA: Diagnosis not present

## 2016-10-20 DIAGNOSIS — R079 Chest pain, unspecified: Secondary | ICD-10-CM | POA: Diagnosis not present

## 2016-10-20 DIAGNOSIS — G4730 Sleep apnea, unspecified: Secondary | ICD-10-CM | POA: Diagnosis not present

## 2016-10-20 DIAGNOSIS — R55 Syncope and collapse: Secondary | ICD-10-CM | POA: Diagnosis not present

## 2016-10-24 DIAGNOSIS — R079 Chest pain, unspecified: Secondary | ICD-10-CM | POA: Diagnosis not present

## 2016-10-24 DIAGNOSIS — R55 Syncope and collapse: Secondary | ICD-10-CM | POA: Diagnosis not present

## 2016-10-26 DIAGNOSIS — R55 Syncope and collapse: Secondary | ICD-10-CM | POA: Diagnosis not present

## 2016-10-26 DIAGNOSIS — R0602 Shortness of breath: Secondary | ICD-10-CM | POA: Diagnosis not present

## 2016-10-26 DIAGNOSIS — I10 Essential (primary) hypertension: Secondary | ICD-10-CM | POA: Diagnosis not present

## 2016-10-26 DIAGNOSIS — E782 Mixed hyperlipidemia: Secondary | ICD-10-CM | POA: Diagnosis not present

## 2016-11-01 DIAGNOSIS — R9430 Abnormal result of cardiovascular function study, unspecified: Secondary | ICD-10-CM | POA: Diagnosis not present

## 2016-11-01 DIAGNOSIS — H2513 Age-related nuclear cataract, bilateral: Secondary | ICD-10-CM | POA: Diagnosis not present

## 2016-11-01 DIAGNOSIS — R55 Syncope and collapse: Secondary | ICD-10-CM | POA: Diagnosis not present

## 2016-11-02 DIAGNOSIS — R5381 Other malaise: Secondary | ICD-10-CM | POA: Diagnosis not present

## 2016-11-02 DIAGNOSIS — I10 Essential (primary) hypertension: Secondary | ICD-10-CM | POA: Diagnosis not present

## 2016-11-02 DIAGNOSIS — Z125 Encounter for screening for malignant neoplasm of prostate: Secondary | ICD-10-CM | POA: Diagnosis not present

## 2016-11-02 DIAGNOSIS — E784 Other hyperlipidemia: Secondary | ICD-10-CM | POA: Diagnosis not present

## 2016-11-03 DIAGNOSIS — I059 Rheumatic mitral valve disease, unspecified: Secondary | ICD-10-CM | POA: Diagnosis not present

## 2016-11-03 DIAGNOSIS — R55 Syncope and collapse: Secondary | ICD-10-CM | POA: Diagnosis not present

## 2016-11-03 DIAGNOSIS — Z0000 Encounter for general adult medical examination without abnormal findings: Secondary | ICD-10-CM | POA: Diagnosis not present

## 2016-11-09 DIAGNOSIS — E782 Mixed hyperlipidemia: Secondary | ICD-10-CM | POA: Diagnosis not present

## 2016-11-09 DIAGNOSIS — I10 Essential (primary) hypertension: Secondary | ICD-10-CM | POA: Diagnosis not present

## 2016-11-09 DIAGNOSIS — R0602 Shortness of breath: Secondary | ICD-10-CM | POA: Diagnosis not present

## 2016-11-09 DIAGNOSIS — R55 Syncope and collapse: Secondary | ICD-10-CM | POA: Diagnosis not present

## 2016-12-01 DIAGNOSIS — I059 Rheumatic mitral valve disease, unspecified: Secondary | ICD-10-CM | POA: Diagnosis not present

## 2016-12-01 DIAGNOSIS — G4731 Primary central sleep apnea: Secondary | ICD-10-CM | POA: Diagnosis not present

## 2016-12-01 DIAGNOSIS — R413 Other amnesia: Secondary | ICD-10-CM | POA: Diagnosis not present

## 2016-12-01 DIAGNOSIS — G459 Transient cerebral ischemic attack, unspecified: Secondary | ICD-10-CM | POA: Diagnosis not present

## 2016-12-27 DIAGNOSIS — H2511 Age-related nuclear cataract, right eye: Secondary | ICD-10-CM | POA: Diagnosis not present

## 2016-12-29 DIAGNOSIS — R5381 Other malaise: Secondary | ICD-10-CM | POA: Diagnosis not present

## 2016-12-29 DIAGNOSIS — R000 Tachycardia, unspecified: Secondary | ICD-10-CM | POA: Diagnosis not present

## 2016-12-29 DIAGNOSIS — I059 Rheumatic mitral valve disease, unspecified: Secondary | ICD-10-CM | POA: Diagnosis not present

## 2016-12-29 DIAGNOSIS — M75100 Unspecified rotator cuff tear or rupture of unspecified shoulder, not specified as traumatic: Secondary | ICD-10-CM | POA: Diagnosis not present

## 2017-01-09 ENCOUNTER — Ambulatory Visit
Admission: RE | Admit: 2017-01-09 | Discharge: 2017-01-09 | Disposition: A | Discharge status: Home / Self Care | Payer: Medicare Other | Source: Ambulatory Visit | Attending: Ophthalmology | Admitting: Ophthalmology

## 2017-01-09 ENCOUNTER — Ambulatory Visit: Payer: Medicare Other | Admitting: Anesthesiology

## 2017-01-09 ENCOUNTER — Encounter
Admission: RE | Disposition: A | Discharge status: Home / Self Care | Payer: Self-pay | Source: Ambulatory Visit | Attending: Ophthalmology

## 2017-01-09 ENCOUNTER — Encounter: Payer: Self-pay | Admitting: *Deleted

## 2017-01-09 DIAGNOSIS — G4730 Sleep apnea, unspecified: Secondary | ICD-10-CM | POA: Diagnosis not present

## 2017-01-09 DIAGNOSIS — N400 Enlarged prostate without lower urinary tract symptoms: Secondary | ICD-10-CM | POA: Insufficient documentation

## 2017-01-09 DIAGNOSIS — H2511 Age-related nuclear cataract, right eye: Secondary | ICD-10-CM | POA: Diagnosis not present

## 2017-01-09 DIAGNOSIS — Z96619 Presence of unspecified artificial shoulder joint: Secondary | ICD-10-CM | POA: Diagnosis not present

## 2017-01-09 DIAGNOSIS — F329 Major depressive disorder, single episode, unspecified: Secondary | ICD-10-CM | POA: Insufficient documentation

## 2017-01-09 DIAGNOSIS — M1990 Unspecified osteoarthritis, unspecified site: Secondary | ICD-10-CM | POA: Insufficient documentation

## 2017-01-09 DIAGNOSIS — R2689 Other abnormalities of gait and mobility: Secondary | ICD-10-CM | POA: Diagnosis not present

## 2017-01-09 DIAGNOSIS — Z8701 Personal history of pneumonia (recurrent): Secondary | ICD-10-CM | POA: Diagnosis not present

## 2017-01-09 DIAGNOSIS — Z7901 Long term (current) use of anticoagulants: Secondary | ICD-10-CM | POA: Diagnosis not present

## 2017-01-09 DIAGNOSIS — Z96659 Presence of unspecified artificial knee joint: Secondary | ICD-10-CM | POA: Diagnosis not present

## 2017-01-09 DIAGNOSIS — E7800 Pure hypercholesterolemia, unspecified: Secondary | ICD-10-CM | POA: Diagnosis not present

## 2017-01-09 DIAGNOSIS — I10 Essential (primary) hypertension: Secondary | ICD-10-CM | POA: Insufficient documentation

## 2017-01-09 DIAGNOSIS — Z87891 Personal history of nicotine dependence: Secondary | ICD-10-CM | POA: Insufficient documentation

## 2017-01-09 DIAGNOSIS — H9190 Unspecified hearing loss, unspecified ear: Secondary | ICD-10-CM | POA: Diagnosis not present

## 2017-01-09 DIAGNOSIS — Z9889 Other specified postprocedural states: Secondary | ICD-10-CM | POA: Insufficient documentation

## 2017-01-09 DIAGNOSIS — Z79899 Other long term (current) drug therapy: Secondary | ICD-10-CM | POA: Insufficient documentation

## 2017-01-09 HISTORY — DX: Pneumonia, unspecified organism: J18.9

## 2017-01-09 HISTORY — DX: Cardiac murmur, unspecified: R01.1

## 2017-01-09 HISTORY — DX: Dizziness and giddiness: R42

## 2017-01-09 HISTORY — PX: CATARACT EXTRACTION W/PHACO: SHX586

## 2017-01-09 HISTORY — DX: Unspecified osteoarthritis, unspecified site: M19.90

## 2017-01-09 HISTORY — DX: Sleep apnea, unspecified: G47.30

## 2017-01-09 HISTORY — DX: Unspecified hearing loss, unspecified ear: H91.90

## 2017-01-09 SURGERY — PHACOEMULSIFICATION, CATARACT, WITH IOL INSERTION
Anesthesia: Monitor Anesthesia Care | Site: Eye | Laterality: Right | Wound class: Clean

## 2017-01-09 MED ORDER — BSS IO SOLN
INTRAOCULAR | Status: DC | PRN
  Administered 2017-01-09: 1 mL via OPHTHALMIC

## 2017-01-09 MED ORDER — LIDOCAINE HCL (PF) 4 % IJ SOLN
INTRAMUSCULAR | Status: DC | PRN
  Administered 2017-01-09: 2 mL via OPHTHALMIC

## 2017-01-09 MED ORDER — NA CHONDROIT SULF-NA HYALURON 40-17 MG/ML IO SOLN
INTRAOCULAR | Status: AC
  Filled 2017-01-09: qty 1

## 2017-01-09 MED ORDER — MOXIFLOXACIN HCL 0.5 % OP SOLN: 1.0000 [drp] | Freq: Once | OPHTHALMIC | Status: DC

## 2017-01-09 MED ORDER — MIDAZOLAM HCL 2 MG/2ML IJ SOLN
INTRAMUSCULAR | Status: AC
  Filled 2017-01-09: qty 2

## 2017-01-09 MED ORDER — EPINEPHRINE PF 1 MG/ML IJ SOLN
INTRAMUSCULAR | Status: AC
  Filled 2017-01-09: qty 2

## 2017-01-09 MED ORDER — MOXIFLOXACIN HCL 0.5 % OP SOLN
OPHTHALMIC | Status: AC
  Filled 2017-01-09: qty 3

## 2017-01-09 MED ORDER — MIDAZOLAM HCL 2 MG/2ML IJ SOLN
INTRAMUSCULAR | Status: DC | PRN
  Administered 2017-01-09 (×2): 1 mg via INTRAVENOUS

## 2017-01-09 MED ORDER — ARMC OPHTHALMIC DILATING DROPS
OPHTHALMIC | Status: AC
  Administered 2017-01-09: 1 via OPHTHALMIC
  Filled 2017-01-09: qty 0.4

## 2017-01-09 MED ORDER — SODIUM CHLORIDE 0.9 % IV SOLN
INTRAVENOUS | Status: DC
  Administered 2017-01-09 (×2): via INTRAVENOUS

## 2017-01-09 MED ORDER — MOXIFLOXACIN HCL 0.5 % OP SOLN
OPHTHALMIC | Status: DC | PRN
  Administered 2017-01-09: .2 mL via OPHTHALMIC

## 2017-01-09 MED ORDER — POVIDONE-IODINE 5 % OP SOLN
OPHTHALMIC | Status: DC | PRN
  Administered 2017-01-09: 1 via OPHTHALMIC

## 2017-01-09 MED ORDER — NA CHONDROIT SULF-NA HYALURON 40-17 MG/ML IO SOLN
INTRAOCULAR | Status: DC | PRN
  Administered 2017-01-09: 1 mL via INTRAOCULAR

## 2017-01-09 MED ORDER — POVIDONE-IODINE 5 % OP SOLN
OPHTHALMIC | Status: AC
  Filled 2017-01-09: qty 30

## 2017-01-09 MED ORDER — ARMC OPHTHALMIC DILATING DROPS
1.0000 "application " | OPHTHALMIC | Status: DC | PRN
  Administered 2017-01-09 (×3): 1 via OPHTHALMIC

## 2017-01-09 MED ORDER — CARBACHOL 0.01 % IO SOLN
INTRAOCULAR | Status: DC | PRN
  Administered 2017-01-09: .5 mL via INTRAOCULAR

## 2017-01-09 MED ORDER — LIDOCAINE HCL (PF) 2 % IJ SOLN
INTRAMUSCULAR | Status: AC
  Filled 2017-01-09: qty 2

## 2017-01-09 SURGICAL SUPPLY — 14 items
GLOVE BIO SURGEON STRL SZ8 (GLOVE) ×2 IMPLANT
GLOVE BIOGEL M 6.5 STRL (GLOVE) ×2 IMPLANT
GLOVE SURG LX 8.0 MICRO (GLOVE) ×1
GLOVE SURG LX STRL 8.0 MICRO (GLOVE) ×1 IMPLANT
GOWN STRL REUS W/ TWL LRG LVL3 (GOWN DISPOSABLE) ×2 IMPLANT
GOWN STRL REUS W/TWL LRG LVL3 (GOWN DISPOSABLE) ×2
LENS IOL TECNIS ITEC 11.5 (Intraocular Lens) ×2 IMPLANT
PACK CATARACT (MISCELLANEOUS) ×2 IMPLANT
PACK CATARACT BRASINGTON LX (MISCELLANEOUS) ×2 IMPLANT
SOL BSS BAG (MISCELLANEOUS) ×2
SOLUTION BSS BAG (MISCELLANEOUS) ×1 IMPLANT
SYR 5ML LL (SYRINGE) ×2 IMPLANT
WATER STERILE IRR 250ML POUR (IV SOLUTION) ×2 IMPLANT
WIPE NON LINTING 3.25X3.25 (MISCELLANEOUS) ×2 IMPLANT

## 2017-01-09 NOTE — Transfer of Care (Signed)
Immediate Anesthesia Transfer of Care Note  Patient: Kyle Santos  Procedure(s) Performed: Procedure(s) with comments: CATARACT EXTRACTION PHACO AND INTRAOCULAR LENS PLACEMENT (IOC) (Right) - [place] 01:06 AP% 19.7 CDE 13.18 Fluid pack lot # [id number]  Patient Location: PACU  Anesthesia Type:MAC  Level of Consciousness: awake and sedated  Airway & Oxygen Therapy: Patient Spontanous Breathing  Post-op Assessment: Report given to RN and Post -op Vital signs reviewed and stable  Post vital signs: Reviewed and stable  Last Vitals:  Vitals:   01/09/17 0810  BP: 124/79  Pulse: 64  Resp: 16  Temp: 36.8 C    Last Pain:  Vitals:   01/09/17 0810  TempSrc: Oral         Complications: No apparent anesthesia complications

## 2017-01-09 NOTE — Anesthesia Postprocedure Evaluation (Signed)
Anesthesia Post Note  Patient: Rhyder A Heber  Procedure(s) Performed: Procedure(s) (LRB): CATARACT EXTRACTION PHACO AND INTRAOCULAR LENS PLACEMENT (IOC) (Right)  Patient location during evaluation: PACU Anesthesia Type: MAC Level of consciousness: awake, awake and alert and oriented Pain management: pain level controlled Vital Signs Assessment: post-procedure vital signs reviewed and stable Respiratory status: spontaneous breathing, nonlabored ventilation and respiratory function stable Cardiovascular status: stable Anesthetic complications: no     Last Vitals:  Vitals:   01/09/17 0810  BP: 124/79  Pulse: 64  Resp: 16  Temp: 36.8 C    Last Pain:  Vitals:   01/09/17 0810  TempSrc: Oral                 Dominigue Gellner,  [facility]

## 2017-01-09 NOTE — Op Note (Signed)
PREOPERATIVE DIAGNOSIS:  Nuclear sclerotic cataract of the right eye.   POSTOPERATIVE DIAGNOSIS:  nuclear sclerotic cataract right eye   OPERATIVE PROCEDURE: Procedure(s): CATARACT EXTRACTION PHACO AND INTRAOCULAR LENS PLACEMENT (IOC)   SURGEON:  Birder Robson, MD.   ANESTHESIA:  Anesthesiologist: Emmie Niemann, MD CRNA: Nelda Marseille, CRNA  1.      Managed anesthesia care. 2.      0.74ml of Shugarcaine was instilled in the eye following the paracentesis.   COMPLICATIONS:  None.   TECHNIQUE:   Stop and chop   DESCRIPTION OF PROCEDURE:  The patient was examined and consented in the preoperative holding area where the aforementioned topical anesthesia was applied to the right eye and then brought back to the Operating Room where the right eye was prepped and draped in the usual sterile ophthalmic fashion and a lid speculum was placed. A paracentesis was created with the side port blade and the anterior chamber was filled with viscoelastic. A near clear corneal incision was performed with the steel keratome. A continuous curvilinear capsulorrhexis was performed with a cystotome followed by the capsulorrhexis forceps. Hydrodissection and hydrodelineation were carried out with BSS on a blunt cannula. The lens was removed in a stop and chop  technique and the remaining cortical material was removed with the irrigation-aspiration handpiece. The capsular bag was inflated with viscoelastic and the Technis ZCB00  lens was placed in the capsular bag without complication. The remaining viscoelastic was removed from the eye with the irrigation-aspiration handpiece. The wounds were hydrated. The anterior chamber was flushed with Miostat and the eye was inflated to physiologic pressure. 0.37ml of Vigamox was placed in the anterior chamber. The wounds were found to be water tight. The eye was dressed with Vigamox. The patient was given protective glasses to wear throughout the day and a shield with which to  sleep tonight. The patient was also given drops with which to begin a drop regimen today and will follow-up with me in one day.  Implant Name Type Inv. Item Serial No. Manufacturer Lot No. LRB No. Used  LENS IOL TECNIS 11.5 - [id number] Intraocular Lens LENS IOL TECNIS 11.5 [id number]   Right 1   Procedure(s) with comments: CATARACT EXTRACTION PHACO AND INTRAOCULAR LENS PLACEMENT (IOC) (Right) - [place] 01:06 AP% 19.7 CDE 13.18 Fluid pack lot # [id number]  Electronically signed: [place] 01/09/2017 9:50 AM

## 2017-01-09 NOTE — Anesthesia Preprocedure Evaluation (Addendum)
Anesthesia Evaluation  Patient identified by MRN, date of birth, ID band Patient awake    Reviewed: Allergy & Precautions, NPO status , Patient's Chart, lab work & pertinent test results  History of Anesthesia Complications Negative for: history of anesthetic complications  Airway Mallampati: I  TM Distance: >3 FB Neck ROM: Full    Dental  (+) Missing   Pulmonary sleep apnea , neg COPD, former smoker,    breath sounds clear to auscultation- rhonchi (-) wheezing      Cardiovascular hypertension, Pt. on medications (-) CAD, (-) Past MI and (-) Cardiac Stents + dysrhythmias Atrial Fibrillation  Rhythm:Regular Rate:Normal - Systolic murmurs and - Diastolic murmurs    Neuro/Psych PSYCHIATRIC DISORDERS Depression negative neurological ROS     GI/Hepatic negative GI ROS, Neg liver ROS,   Endo/Other  negative endocrine ROSneg diabetes  Renal/GU negative Renal ROS     Musculoskeletal  (+) Arthritis ,   Abdominal (+) - obese,   Peds  Hematology negative hematology ROS (+)   Anesthesia Other Findings Past Medical History: No date: A-fib (HCC) No date: Arthritis No date: BPH (benign prostatic hyperplasia) No date: Depression No date: Heart murmur No date: HOH (hard of hearing) No date: Hypercholesteremia No date: Hypertension No date: Loss of equilibrium     Comment: being evaluated at this time. No date: Pneumonia     Comment: history of No date: Sleep apnea   Reproductive/Obstetrics                             Anesthesia Physical Anesthesia Plan  ASA: III  Anesthesia Plan: MAC   Post-op Pain Management:    Induction: Intravenous  Airway Management Planned: Natural Airway  Additional Equipment:   Intra-op Plan:   Post-operative Plan:   Informed Consent: I have reviewed the patients History and Physical, chart, labs and discussed the procedure including the risks, benefits and  alternatives for the proposed anesthesia with the patient or authorized representative who has indicated his/her understanding and acceptance.     Plan Discussed with: CRNA and Anesthesiologist  Anesthesia Plan Comments:        Anesthesia Quick Evaluation

## 2017-01-09 NOTE — Anesthesia Procedure Notes (Signed)
Date/Time: 01/09/2017 9:35 AM Performed by: Nelda Marseille Pre-anesthesia Checklist: Emergency Drugs available, Patient identified, Suction available, Patient being monitored and Timeout performed Oxygen Delivery Method: Nasal cannula

## 2017-01-09 NOTE — Discharge Instructions (Signed)
Eye Surgery Discharge Instructions  Expect mild scratchy sensation or mild soreness. DO NOT RUB YOUR EYE!  The day of surgery:  Minimal physical activity, but bed rest is not required  No reading, computer work, or close hand work  No bending, lifting, or straining.  May watch TV  For 24 hours:  No driving, legal decisions, or alcoholic beverages  Safety precautions  Eat anything you prefer: It is better to start with liquids, then soup then solid foods.  _____ Eye patch should be worn until postoperative exam tomorrow.  ____ Solar shield eyeglasses should be worn for comfort in the sunlight/patch while sleeping  Resume all regular medications including aspirin or Coumadin if these were discontinued prior to surgery. You may shower, bathe, shave, or wash your hair. Tylenol may be taken for mild discomfort.  Call your doctor if you experience significant pain, nausea, or vomiting, fever > 101 or other signs of infection. [phone] or [phone] Specific instructions:  Follow-up Information    Birder Robson, MD Follow up on 01/10/2017.   Specialty:  Ophthalmology Why:  9:45 Contact information: [address] [place] [address] [phone]

## 2017-01-09 NOTE — H&P (Signed)
All labs reviewed. Abnormal studies sent to patients PCP when indicated.  Previous H&P reviewed, patient examined, there are NO CHANGES.  Kyle Constantine LOUIS5/22/20189:21 AM

## 2017-01-09 NOTE — Anesthesia Post-op Follow-up Note (Cosign Needed)
Anesthesia QCDR form completed.        

## 2017-01-16 DIAGNOSIS — E782 Mixed hyperlipidemia: Secondary | ICD-10-CM | POA: Diagnosis not present

## 2017-01-16 DIAGNOSIS — I10 Essential (primary) hypertension: Secondary | ICD-10-CM | POA: Diagnosis not present

## 2017-01-16 DIAGNOSIS — I2510 Atherosclerotic heart disease of native coronary artery without angina pectoris: Secondary | ICD-10-CM | POA: Diagnosis not present

## 2017-01-31 ENCOUNTER — Other Ambulatory Visit: Payer: Self-pay | Admitting: Unknown Physician Specialty

## 2017-01-31 DIAGNOSIS — H903 Sensorineural hearing loss, bilateral: Secondary | ICD-10-CM | POA: Diagnosis not present

## 2017-01-31 DIAGNOSIS — H9191 Unspecified hearing loss, right ear: Secondary | ICD-10-CM

## 2017-01-31 DIAGNOSIS — R42 Dizziness and giddiness: Secondary | ICD-10-CM | POA: Diagnosis not present

## 2017-02-05 DIAGNOSIS — R413 Other amnesia: Secondary | ICD-10-CM | POA: Diagnosis not present

## 2017-02-05 DIAGNOSIS — G459 Transient cerebral ischemic attack, unspecified: Secondary | ICD-10-CM | POA: Diagnosis not present

## 2017-02-05 DIAGNOSIS — I059 Rheumatic mitral valve disease, unspecified: Secondary | ICD-10-CM | POA: Diagnosis not present

## 2017-02-05 DIAGNOSIS — R0602 Shortness of breath: Secondary | ICD-10-CM | POA: Diagnosis not present

## 2017-02-12 ENCOUNTER — Ambulatory Visit
Admission: RE | Admit: 2017-02-12 | Discharge: 2017-02-12 | Disposition: A | Discharge status: Home / Self Care | Payer: Medicare Other | Source: Ambulatory Visit | Attending: Unknown Physician Specialty | Admitting: Unknown Physician Specialty

## 2017-02-12 DIAGNOSIS — H9191 Unspecified hearing loss, right ear: Secondary | ICD-10-CM | POA: Diagnosis present

## 2017-02-12 DIAGNOSIS — H61891 Other specified disorders of right external ear: Secondary | ICD-10-CM | POA: Diagnosis not present

## 2017-02-12 DIAGNOSIS — R410 Disorientation, unspecified: Secondary | ICD-10-CM | POA: Diagnosis not present

## 2017-02-12 LAB — POCT I-STAT CREATININE: Creatinine, Ser: 1.3 mg/dL — ABNORMAL HIGH (ref 0.61–1.24)

## 2017-02-12 MED ORDER — GADOBENATE DIMEGLUMINE 529 MG/ML IV SOLN
15.0000 mL | Freq: Once | INTRAVENOUS | Status: AC | PRN
  Administered 2017-02-12: 15 mL via INTRAVENOUS

## 2017-02-26 ENCOUNTER — Other Ambulatory Visit: Payer: Self-pay | Admitting: Unknown Physician Specialty

## 2017-02-26 DIAGNOSIS — IMO0001 Reserved for inherently not codable concepts without codable children: Secondary | ICD-10-CM

## 2017-02-26 DIAGNOSIS — D333 Benign neoplasm of cranial nerves: Secondary | ICD-10-CM | POA: Diagnosis not present

## 2017-02-26 DIAGNOSIS — H918X1 Other specified hearing loss, right ear: Secondary | ICD-10-CM

## 2017-02-26 DIAGNOSIS — R42 Dizziness and giddiness: Secondary | ICD-10-CM | POA: Diagnosis not present

## 2017-02-26 DIAGNOSIS — H903 Sensorineural hearing loss, bilateral: Secondary | ICD-10-CM | POA: Diagnosis not present

## 2017-03-06 DIAGNOSIS — G4711 Idiopathic hypersomnia with long sleep time: Secondary | ICD-10-CM | POA: Diagnosis not present

## 2017-03-06 DIAGNOSIS — R000 Tachycardia, unspecified: Secondary | ICD-10-CM | POA: Diagnosis not present

## 2017-03-06 DIAGNOSIS — I059 Rheumatic mitral valve disease, unspecified: Secondary | ICD-10-CM | POA: Diagnosis not present

## 2017-03-06 DIAGNOSIS — R5381 Other malaise: Secondary | ICD-10-CM | POA: Diagnosis not present

## 2017-03-13 DIAGNOSIS — R2681 Unsteadiness on feet: Secondary | ICD-10-CM | POA: Diagnosis not present

## 2017-03-13 DIAGNOSIS — M25551 Pain in right hip: Secondary | ICD-10-CM | POA: Diagnosis not present

## 2017-03-13 DIAGNOSIS — R262 Difficulty in walking, not elsewhere classified: Secondary | ICD-10-CM | POA: Diagnosis not present

## 2017-03-13 DIAGNOSIS — R293 Abnormal posture: Secondary | ICD-10-CM | POA: Diagnosis not present

## 2017-03-13 DIAGNOSIS — M6281 Muscle weakness (generalized): Secondary | ICD-10-CM | POA: Diagnosis not present

## 2017-04-13 DIAGNOSIS — R000 Tachycardia, unspecified: Secondary | ICD-10-CM | POA: Diagnosis not present

## 2017-04-13 DIAGNOSIS — R5381 Other malaise: Secondary | ICD-10-CM | POA: Diagnosis not present

## 2017-04-13 DIAGNOSIS — G4711 Idiopathic hypersomnia with long sleep time: Secondary | ICD-10-CM | POA: Diagnosis not present

## 2017-04-13 DIAGNOSIS — I059 Rheumatic mitral valve disease, unspecified: Secondary | ICD-10-CM | POA: Diagnosis not present

## 2017-05-11 DIAGNOSIS — E784 Other hyperlipidemia: Secondary | ICD-10-CM | POA: Diagnosis not present

## 2017-05-11 DIAGNOSIS — R5381 Other malaise: Secondary | ICD-10-CM | POA: Diagnosis not present

## 2017-05-11 DIAGNOSIS — Z125 Encounter for screening for malignant neoplasm of prostate: Secondary | ICD-10-CM | POA: Diagnosis not present

## 2017-05-11 DIAGNOSIS — I10 Essential (primary) hypertension: Secondary | ICD-10-CM | POA: Diagnosis not present

## 2017-05-21 DIAGNOSIS — I2510 Atherosclerotic heart disease of native coronary artery without angina pectoris: Secondary | ICD-10-CM | POA: Diagnosis not present

## 2017-05-21 DIAGNOSIS — R42 Dizziness and giddiness: Secondary | ICD-10-CM | POA: Diagnosis not present

## 2017-05-21 DIAGNOSIS — I10 Essential (primary) hypertension: Secondary | ICD-10-CM | POA: Diagnosis not present

## 2017-05-21 DIAGNOSIS — E782 Mixed hyperlipidemia: Secondary | ICD-10-CM | POA: Diagnosis not present

## 2017-05-22 DIAGNOSIS — M75100 Unspecified rotator cuff tear or rupture of unspecified shoulder, not specified as traumatic: Secondary | ICD-10-CM | POA: Diagnosis not present

## 2017-05-22 DIAGNOSIS — G319 Degenerative disease of nervous system, unspecified: Secondary | ICD-10-CM | POA: Diagnosis not present

## 2017-05-22 DIAGNOSIS — I10 Essential (primary) hypertension: Secondary | ICD-10-CM | POA: Diagnosis not present

## 2017-05-22 DIAGNOSIS — G4711 Idiopathic hypersomnia with long sleep time: Secondary | ICD-10-CM | POA: Diagnosis not present

## 2017-06-04 DIAGNOSIS — H2512 Age-related nuclear cataract, left eye: Secondary | ICD-10-CM | POA: Diagnosis not present

## 2017-06-18 ENCOUNTER — Emergency Department
Admission: EM | Admit: 2017-06-18 | Discharge: 2017-06-18 | Disposition: A | Discharge status: Home / Self Care | Payer: Medicare Other | Attending: Emergency Medicine | Admitting: Emergency Medicine

## 2017-06-18 ENCOUNTER — Encounter: Payer: Self-pay | Admitting: Emergency Medicine

## 2017-06-18 DIAGNOSIS — N390 Urinary tract infection, site not specified: Secondary | ICD-10-CM | POA: Insufficient documentation

## 2017-06-18 DIAGNOSIS — I4891 Unspecified atrial fibrillation: Secondary | ICD-10-CM | POA: Diagnosis not present

## 2017-06-18 DIAGNOSIS — Z87891 Personal history of nicotine dependence: Secondary | ICD-10-CM | POA: Insufficient documentation

## 2017-06-18 DIAGNOSIS — R319 Hematuria, unspecified: Secondary | ICD-10-CM | POA: Diagnosis not present

## 2017-06-18 DIAGNOSIS — N179 Acute kidney failure, unspecified: Secondary | ICD-10-CM | POA: Diagnosis not present

## 2017-06-18 DIAGNOSIS — R55 Syncope and collapse: Secondary | ICD-10-CM

## 2017-06-18 DIAGNOSIS — Z7901 Long term (current) use of anticoagulants: Secondary | ICD-10-CM | POA: Insufficient documentation

## 2017-06-18 DIAGNOSIS — I10 Essential (primary) hypertension: Secondary | ICD-10-CM | POA: Insufficient documentation

## 2017-06-18 DIAGNOSIS — Z79899 Other long term (current) drug therapy: Secondary | ICD-10-CM | POA: Insufficient documentation

## 2017-06-18 LAB — URINALYSIS, COMPLETE (UACMP) WITH MICROSCOPIC
Bilirubin Urine: NEGATIVE
GLUCOSE, UA: NEGATIVE mg/dL
Hgb urine dipstick: NEGATIVE
Ketones, ur: NEGATIVE mg/dL
NITRITE: POSITIVE — AB
PH: 6 (ref 5.0–8.0)
Protein, ur: 30 mg/dL — AB
SPECIFIC GRAVITY, URINE: 1.018 (ref 1.005–1.030)
Squamous Epithelial / LPF: NONE SEEN

## 2017-06-18 LAB — CBC
HCT: 39.9 % — ABNORMAL LOW (ref 40.0–52.0)
Hemoglobin: 13.6 g/dL (ref 13.0–18.0)
MCH: 31.2 pg (ref 26.0–34.0)
MCHC: 34.1 g/dL (ref 32.0–36.0)
MCV: 91.6 fL (ref 80.0–100.0)
PLATELETS: 156 10*3/uL (ref 150–440)
RBC: 4.35 MIL/uL — AB (ref 4.40–5.90)
RDW: 14.1 % (ref 11.5–14.5)
WBC: 7.1 10*3/uL (ref 3.8–10.6)

## 2017-06-18 LAB — BASIC METABOLIC PANEL
ANION GAP: 8 (ref 5–15)
BUN: 27 mg/dL — ABNORMAL HIGH (ref 6–20)
CALCIUM: 9.1 mg/dL (ref 8.9–10.3)
CO2: 24 mmol/L (ref 22–32)
CREATININE: 1.61 mg/dL — AB (ref 0.61–1.24)
Chloride: 106 mmol/L (ref 101–111)
GFR calc non Af Amer: 39 mL/min — ABNORMAL LOW (ref >60)
GFR, EST AFRICAN AMERICAN: 45 mL/min — AB (ref >60)
GLUCOSE: 140 mg/dL — AB (ref 65–99)
Potassium: 4.1 mmol/L (ref 3.5–5.1)
Sodium: 138 mmol/L (ref 135–145)

## 2017-06-18 LAB — PROTIME-INR
INR: 2.35
Prothrombin Time: 25.5 seconds — ABNORMAL HIGH (ref 11.4–15.2)

## 2017-06-18 LAB — TROPONIN I: Troponin I: <0.03 ng/mL (ref <0.03)

## 2017-06-18 MED ORDER — CEPHALEXIN 500 MG PO CAPS: 500.0000 mg | ORAL_CAPSULE | Freq: Two times a day (BID) | ORAL | 0 refills | Status: DC

## 2017-06-18 MED ORDER — SODIUM CHLORIDE 0.9 % IV BOLUS (SEPSIS)
1000.0000 mL | Freq: Once | INTRAVENOUS | Status: AC
  Administered 2017-06-18: 1000 mL via INTRAVENOUS

## 2017-06-18 MED ORDER — CEFTRIAXONE SODIUM IN DEXTROSE 20 MG/ML IV SOLN
1.0000 g | Freq: Once | INTRAVENOUS | Status: DC
  Filled 2017-06-18: qty 50

## 2017-06-18 MED ORDER — CEFTRIAXONE SODIUM 1 G IJ SOLR
INTRAMUSCULAR | Status: AC
  Filled 2017-06-18: qty 10

## 2017-06-18 NOTE — ED Triage Notes (Signed)
Patient to ED via [facility]. Per EMS, patient was standing up and had a witnessed syncopal episode with positive LOC. Per bystanders, patient dropped to knees, then the floor but did not hit head. Patient reports history of similar episodes but denies definitive diagnosis. Patient denies any pain or injuries at this time. A&O x4.

## 2017-06-18 NOTE — Discharge Instructions (Signed)
You are evaluated for passing out, and as we discussed you are found to have a urinary tract infection, mild decreased kidney filtration called mild acute renal failure which may also be related to mild dehydration.  He was given IV fluids as well as antibiotic coverage for 24 hours in the emergency department today called Rocephin.  Start your antibiotic tomorrow and complete the entire course.  As we discussed, I do not think that your passing out was related to heart condition, but please follow-up with your cardiologist this week.  Return to the emergency department immediately for any new or worsening condition including passing out, confusion, altered mental status, fever, any worsening over the next 24 hours, or any other symptoms concerning to you.

## 2017-06-18 NOTE — ED Notes (Signed)
Pt is resting in bed Family at bedside. Pt is aware we are waiting on Urine.

## 2017-06-18 NOTE — ED Provider Notes (Signed)
[facility] Emergency Department Provider Note ____________________________________________   I have reviewed the triage vital signs and the triage nursing note.  HISTORY  Chief Complaint Loss of Consciousness   Historian Patient and wife and family members  HPI Kyle Santos is a 81 y.o. male with a history of A. fib presents from running errands today and was at the car shop and started feeling lightheaded and then he grabbed onto the counter and then was lowered onto his knees.  Apparently he did lose consciousness.  There was apparently no head trauma.  No headache.  No focal weakness or numbness.  No seizure activity.  No fevers.  No abdominal pain.  No nausea or vomiting.   Past Medical History:  Diagnosis Date  . A-fib ([place])   . Arthritis   . BPH (benign prostatic hyperplasia)   . Depression   . Heart murmur   . HOH (hard of hearing)   . Hypercholesteremia   . Hypertension   . Loss of equilibrium    being evaluated at this time.  . Pneumonia    history of  . Sleep apnea     Patient Active Problem List   Diagnosis Date Noted  . Tremor, essential 03/23/2015  . Restless legs syndrome (RLS) 03/23/2015    Past Surgical History:  Procedure Laterality Date  . CATARACT EXTRACTION W/PHACO Right 01/09/2017   Procedure: CATARACT EXTRACTION PHACO AND INTRAOCULAR LENS PLACEMENT (IOC);  Surgeon: Birder Robson, MD;  Location: [facility] ORS;  Service: Ophthalmology;  Laterality: Right;  [place] 01:06 AP% 19.7 CDE 13.18 Fluid pack lot # [id number]  . COLONOSCOPY    . JOINT REPLACEMENT     knee  . JOINT REPLACEMENT     shoulder    Prior to Admission medications   Medication Sig Start Date End Date Taking? Authorizing Provider  atorvastatin (LIPITOR) 80 MG tablet Take 40 mg by mouth daily.   Yes [provider]  B Complex Vitamins (B COMPLEX PO) Take 1 tablet by mouth daily at 3 pm.   Yes [provider]  buPROPion (WELLBUTRIN SR) 200  MG 12 hr tablet Take 200 mg by mouth 2 (two) times daily.   Yes [provider]  [place] 450 MG CAPS Take 450 mg by mouth 2 (two) times daily.   Yes [provider]  Cholecalciferol (VITAMIN D3) 1000 units CAPS Take 1,000 Units by mouth daily at 3 pm.   Yes [provider]  enalapril (VASOTEC) 5 MG tablet Take 5 mg by mouth every evening.    Yes [provider]  Eszopiclone (ESZOPICLONE) 3 MG TABS Take 1 tablet by mouth at bedtime.   Yes [provider]  ferrous gluconate (IRON 27) 240 (27 FE) MG tablet Take 240 mg by mouth daily at 3 pm.   Yes [provider]  FLUoxetine (PROZAC) 10 MG tablet Take 10 mg by mouth daily.   Yes [provider]  LORazepam (ATIVAN) 0.5 MG tablet Take 0.5 mg by mouth 3 (three) times daily.    Yes [provider]  Melatonin 5 MG TABS Take 5 mg by mouth at bedtime.    Yes [provider]  simvastatin (ZOCOR) 40 MG tablet Take 40 mg by mouth daily.   Yes [provider]  tamsulosin (FLOMAX) 0.4 MG CAPS capsule Take 0.4 mg by mouth daily.    Yes [provider]  traZODone (DESYREL) 50 MG tablet Take 50 mg by mouth at bedtime.  Yes [provider]  warfarin (COUMADIN) 10 MG tablet Take 10 mg by mouth 3 (three) times a week. Tuesday,thursday,saturday   Yes [provider]  warfarin (COUMADIN) 7.5 MG tablet Take 7.5 mgs daily in the morning on monday,wednesday,friday,sunday   Yes [provider]  cephALEXin (KEFLEX) 500 MG capsule Take 1 capsule (500 mg total) by mouth 2 (two) times daily. 06/18/17   Lisa Roca, MD  naproxen sodium (ANAPROX) 220 MG tablet Take 440 mg by mouth daily as needed (pain).    [provider]  phenylephrine (NEO-SYNEPHRINE) 1 % nasal spray Place 1 drop into both nostrils daily as needed for congestion.    [provider]    No Known Allergies  Family History  Problem Relation Age of Onset  .  Heart disease Father     Social History Social History  Substance Use Topics  . Smoking status: Former Smoker    Quit date: 08/22/1943  . Smokeless tobacco: Former Systems developer  . Alcohol use 0.0 oz/week     Comment: occasional    Review of Systems  Constitutional: Negative for fever. Eyes: Negative for visual changes. ENT: Negative for sore throat. Cardiovascular: Negative for chest pain. Respiratory: Negative for shortness of breath. Gastrointestinal: Negative for abdominal pain, vomiting and diarrhea. Genitourinary: Negative for dysuria. Musculoskeletal: Negative for back pain. Skin: Negative for rash. Neurological: Negative for headache.  ____________________________________________   PHYSICAL EXAM:  VITAL SIGNS: ED Triage Vitals  Enc Vitals Group     BP 06/18/17 1019 125/80     Pulse Rate 06/18/17 1019 75     Resp 06/18/17 1019 18     Temp 06/18/17 1019 98 F (36.7 C)     Temp Source 06/18/17 1019 Oral     SpO2 06/18/17 1019 96 %     Weight 06/18/17 1020 180 lb (81.6 kg)     Height 06/18/17 1020 5\' 7"  (1.702 m)     Head Circumference --      Peak Flow --      Pain Score --      Pain Loc --      Pain Edu? --      Excl. in [place]? --      Constitutional: Alert and oriented. Well appearing and in no distress. HEENT   Head: Normocephalic and atraumatic.      Eyes: Conjunctivae are normal. Pupils equal and round.       Ears:         Nose: No congestion/rhinnorhea.   Mouth/Throat: Mucous membranes are moist.   Neck: No stridor. Cardiovascular/Chest: Normal rate, regular rhythm.  No murmurs, rubs, or gallops. Respiratory: Normal respiratory effort without tachypnea nor retractions. Breath sounds are clear and equal bilaterally. No wheezes/rales/rhonchi. Gastrointestinal: Soft. No distention, no guarding, no rebound. Nontender.    Genitourinary/rectal:Deferred Musculoskeletal: Nontender with normal range of motion in all extremities. No joint effusions.  No  lower extremity tenderness.  No edema. Neurologic:  Normal speech and language. No gross or focal neurologic deficits are appreciated. Skin:  Skin is warm, dry and intact. No rash noted. Psychiatric: Mood and affect are normal. Speech and behavior are normal. Patient exhibits appropriate insight and judgment.   ____________________________________________  LABS (pertinent positives/negatives) I, Lisa Roca, MD the attending physician have reviewed the labs noted below.  Labs Reviewed  BASIC METABOLIC PANEL - Abnormal; Notable for the following:       Result Value   Glucose, Bld 140 (*)    BUN 27 (*)  Creatinine, Ser 1.61 (*)    GFR calc non Af Amer 39 (*)    GFR calc Af Amer 45 (*)    All other components within normal limits  CBC - Abnormal; Notable for the following:    RBC 4.35 (*)    HCT 39.9 (*)    All other components within normal limits  URINALYSIS, COMPLETE (UACMP) WITH MICROSCOPIC - Abnormal; Notable for the following:    Color, Urine YELLOW (*)    APPearance CLOUDY (*)    Protein, ur 30 (*)    Nitrite POSITIVE (*)    Leukocytes, UA LARGE (*)    Bacteria, UA FEW (*)    All other components within normal limits  PROTIME-INR - Abnormal; Notable for the following:    Prothrombin Time 25.5 (*)    All other components within normal limits  URINE CULTURE  TROPONIN I    ____________________________________________    EKG I, Lisa Roca, MD, the attending physician have personally viewed and interpreted all ECGs.  73 bpm.  normal sinus rhythm.  Narrow QS with normal axis.  Nonspecific ST and T wave ____________________________________________  RADIOLOGY All Xrays were viewed by me.  Imaging interpreted by Radiologist, and I, Lisa Roca, MD the attending physician have reviewed the radiologist interpretation noted below.  None __________________________________________  PROCEDURES  Procedure(s) performed: None  Critical Care performed:  None  ____________________________________________  No current facility-administered medications on file prior to encounter.    Current Outpatient Prescriptions on File Prior to Encounter  Medication Sig Dispense Refill  . atorvastatin (LIPITOR) 80 MG tablet Take 40 mg by mouth daily.    . B Complex Vitamins (B COMPLEX PO) Take 1 tablet by mouth daily at 3 pm.    . buPROPion (WELLBUTRIN SR) 200 MG 12 hr tablet Take 200 mg by mouth 2 (two) times daily.    Rolena Infante Sagrada 450 MG CAPS Take 450 mg by mouth 2 (two) times daily.    . Cholecalciferol (VITAMIN D3) 1000 units CAPS Take 1,000 Units by mouth daily at 3 pm.    . enalapril (VASOTEC) 5 MG tablet Take 5 mg by mouth every evening.     . ferrous gluconate (IRON 27) 240 (27 FE) MG tablet Take 240 mg by mouth daily at 3 pm.    . FLUoxetine (PROZAC) 10 MG tablet Take 10 mg by mouth daily.    Marland Kitchen LORazepam (ATIVAN) 0.5 MG tablet Take 0.5 mg by mouth 3 (three) times daily.     . Melatonin 5 MG TABS Take 5 mg by mouth at bedtime.     . tamsulosin (FLOMAX) 0.4 MG CAPS capsule Take 0.4 mg by mouth daily.     . traZODone (DESYREL) 50 MG tablet Take 50 mg by mouth at bedtime.     Marland Kitchen warfarin (COUMADIN) 7.5 MG tablet Take 7.5 mgs daily in the morning on monday,wednesday,friday,sunday    . naproxen sodium (ANAPROX) 220 MG tablet Take 440 mg by mouth daily as needed (pain).    . phenylephrine (NEO-SYNEPHRINE) 1 % nasal spray Place 1 drop into both nostrils daily as needed for congestion.      ____________________________________________  ED COURSE / ASSESSMENT AND PLAN  Pertinent labs & imaging results that were available during my care of the patient were reviewed by me and considered in my medical decision making (see chart for details).    Patient did have true syncope, sounds like he had a prodrome right beforehand and his blood pressure was  100/60 when EMS got there.  Blood pressures here for a been about 150.  Patient states he feels  well now.  Patient laboratory studies show a mild decreased renal function, consider dehydration.  Family states that he does not drink fluids that well.  Urinalysis came back nitrite positive urinary tract infection.  I discussed this with the patient and the family and patient was given a dose of IV Rocephin here.  I am not suspicious of sepsis at this point time.  He has had no fever, no tachycardia, no elevated white blood cell count, is not having any symptoms.  Patient be discharged with Keflex to start tomorrow.  Urine culture was sent.  I do not think the syncope was likely cardiac in etiology.  Patient does have a cardiologist, I have asked him to go ahead and follow closely there as well.   CONSULTATIONS:   None  Patient / Family / Caregiver informed of clinical course, medical decision-making process, and agree with plan.   I discussed return precautions, follow-up instructions, and discharge instructions with patient and/or family.  Discharge Instructions : You are evaluated for passing out, and as we discussed you are found to have a urinary tract infection, mild decreased kidney filtration called mild acute renal failure which may also be related to mild dehydration.  He was given IV fluids as well as antibiotic coverage for 24 hours in the emergency department today called Rocephin.  Start your antibiotic tomorrow and complete the entire course.  As we discussed, I do not think that your passing out was related to heart condition, but please follow-up with your cardiologist this week.  Return to the emergency department immediately for any new or worsening condition including passing out, confusion, altered mental status, fever, any worsening over the next 24 hours, or any other symptoms concerning to you.  ___________________________________________   FINAL CLINICAL IMPRESSION(S) / ED DIAGNOSES   Final diagnoses:  Acute renal failure, unspecified acute renal failure  type ([place])  Urinary tract infection with hematuria, site unspecified  Syncope, unspecified syncope type              Note: This dictation was prepared with Dragon dictation. Any transcriptional errors that result from this process are unintentional    Lisa Roca, MD 06/18/17 1441

## 2017-06-20 LAB — URINE CULTURE

## 2017-06-29 DIAGNOSIS — M75100 Unspecified rotator cuff tear or rupture of unspecified shoulder, not specified as traumatic: Secondary | ICD-10-CM | POA: Diagnosis not present

## 2017-06-29 DIAGNOSIS — I059 Rheumatic mitral valve disease, unspecified: Secondary | ICD-10-CM | POA: Diagnosis not present

## 2017-06-29 DIAGNOSIS — I9589 Other hypotension: Secondary | ICD-10-CM | POA: Diagnosis not present

## 2017-06-29 DIAGNOSIS — H832X9 Labyrinthine dysfunction, unspecified ear: Secondary | ICD-10-CM | POA: Diagnosis not present

## 2017-07-18 DIAGNOSIS — H2512 Age-related nuclear cataract, left eye: Secondary | ICD-10-CM | POA: Diagnosis not present

## 2017-08-02 DIAGNOSIS — G319 Degenerative disease of nervous system, unspecified: Secondary | ICD-10-CM | POA: Diagnosis not present

## 2017-08-02 DIAGNOSIS — M75100 Unspecified rotator cuff tear or rupture of unspecified shoulder, not specified as traumatic: Secondary | ICD-10-CM | POA: Diagnosis not present

## 2017-08-02 DIAGNOSIS — I6990 Unspecified sequelae of unspecified cerebrovascular disease: Secondary | ICD-10-CM | POA: Diagnosis not present

## 2017-08-02 DIAGNOSIS — I059 Rheumatic mitral valve disease, unspecified: Secondary | ICD-10-CM | POA: Diagnosis not present

## 2017-08-07 ENCOUNTER — Ambulatory Visit: Payer: Medicare Other | Admitting: Certified Registered Nurse Anesthetist

## 2017-08-07 ENCOUNTER — Encounter
Admission: RE | Disposition: A | Discharge status: Home / Self Care | Payer: Self-pay | Source: Ambulatory Visit | Attending: Ophthalmology

## 2017-08-07 ENCOUNTER — Ambulatory Visit
Admission: RE | Admit: 2017-08-07 | Discharge: 2017-08-07 | Disposition: A | Discharge status: Home / Self Care | Payer: Medicare Other | Source: Ambulatory Visit | Attending: Ophthalmology | Admitting: Ophthalmology

## 2017-08-07 DIAGNOSIS — E7800 Pure hypercholesterolemia, unspecified: Secondary | ICD-10-CM | POA: Insufficient documentation

## 2017-08-07 DIAGNOSIS — M1990 Unspecified osteoarthritis, unspecified site: Secondary | ICD-10-CM | POA: Insufficient documentation

## 2017-08-07 DIAGNOSIS — G4730 Sleep apnea, unspecified: Secondary | ICD-10-CM | POA: Insufficient documentation

## 2017-08-07 DIAGNOSIS — Z7901 Long term (current) use of anticoagulants: Secondary | ICD-10-CM | POA: Insufficient documentation

## 2017-08-07 DIAGNOSIS — Z87891 Personal history of nicotine dependence: Secondary | ICD-10-CM | POA: Insufficient documentation

## 2017-08-07 DIAGNOSIS — I480 Paroxysmal atrial fibrillation: Secondary | ICD-10-CM | POA: Insufficient documentation

## 2017-08-07 DIAGNOSIS — N400 Enlarged prostate without lower urinary tract symptoms: Secondary | ICD-10-CM | POA: Insufficient documentation

## 2017-08-07 DIAGNOSIS — Z79899 Other long term (current) drug therapy: Secondary | ICD-10-CM | POA: Diagnosis not present

## 2017-08-07 DIAGNOSIS — Z9849 Cataract extraction status, unspecified eye: Secondary | ICD-10-CM | POA: Insufficient documentation

## 2017-08-07 DIAGNOSIS — H2512 Age-related nuclear cataract, left eye: Secondary | ICD-10-CM | POA: Insufficient documentation

## 2017-08-07 DIAGNOSIS — Z9889 Other specified postprocedural states: Secondary | ICD-10-CM | POA: Diagnosis not present

## 2017-08-07 DIAGNOSIS — F329 Major depressive disorder, single episode, unspecified: Secondary | ICD-10-CM | POA: Insufficient documentation

## 2017-08-07 DIAGNOSIS — G2581 Restless legs syndrome: Secondary | ICD-10-CM | POA: Diagnosis not present

## 2017-08-07 DIAGNOSIS — I10 Essential (primary) hypertension: Secondary | ICD-10-CM | POA: Diagnosis not present

## 2017-08-07 HISTORY — PX: CATARACT EXTRACTION W/PHACO: SHX586

## 2017-08-07 SURGERY — PHACOEMULSIFICATION, CATARACT, WITH IOL INSERTION
Anesthesia: Monitor Anesthesia Care | Site: Eye | Laterality: Left | Wound class: Clean

## 2017-08-07 MED ORDER — NA CHONDROIT SULF-NA HYALURON 40-17 MG/ML IO SOLN
INTRAOCULAR | Status: DC | PRN
  Administered 2017-08-07: 1 mL via INTRAOCULAR

## 2017-08-07 MED ORDER — LIDOCAINE HCL (PF) 4 % IJ SOLN
INTRAOCULAR | Status: DC | PRN
  Administered 2017-08-07: 2 mL via OPHTHALMIC

## 2017-08-07 MED ORDER — MOXIFLOXACIN HCL 0.5 % OP SOLN
OPHTHALMIC | Status: AC
  Filled 2017-08-07: qty 3

## 2017-08-07 MED ORDER — CARBACHOL 0.01 % IO SOLN
INTRAOCULAR | Status: DC | PRN
  Administered 2017-08-07: .5 mL via INTRAOCULAR

## 2017-08-07 MED ORDER — FENTANYL CITRATE (PF) 100 MCG/2ML IJ SOLN
INTRAMUSCULAR | Status: AC
  Filled 2017-08-07: qty 2

## 2017-08-07 MED ORDER — MOXIFLOXACIN HCL 0.5 % OP SOLN: 1.0000 [drp] | OPHTHALMIC | Status: DC | PRN

## 2017-08-07 MED ORDER — MOXIFLOXACIN HCL 0.5 % OP SOLN
OPHTHALMIC | Status: DC | PRN
  Administered 2017-08-07: .2 mL via OPHTHALMIC

## 2017-08-07 MED ORDER — POVIDONE-IODINE 5 % OP SOLN
OPHTHALMIC | Status: DC | PRN
  Administered 2017-08-07: 1 via OPHTHALMIC

## 2017-08-07 MED ORDER — FENTANYL CITRATE (PF) 100 MCG/2ML IJ SOLN
INTRAMUSCULAR | Status: DC | PRN
  Administered 2017-08-07: 50 ug via INTRAVENOUS

## 2017-08-07 MED ORDER — SODIUM CHLORIDE 0.9 % IV SOLN
INTRAVENOUS | Status: DC
  Administered 2017-08-07: 09:00:00 via INTRAVENOUS

## 2017-08-07 MED ORDER — ARMC OPHTHALMIC DILATING DROPS
1.0000 "application " | OPHTHALMIC | Status: AC
  Administered 2017-08-07 (×2): 1 via OPHTHALMIC

## 2017-08-07 MED ORDER — ARMC OPHTHALMIC DILATING DROPS
OPHTHALMIC | Status: AC
  Filled 2017-08-07: qty 0.4

## 2017-08-07 MED ORDER — EPINEPHRINE PF 1 MG/ML IJ SOLN
INTRAOCULAR | Status: DC | PRN
  Administered 2017-08-07: 1 mL via OPHTHALMIC

## 2017-08-07 SURGICAL SUPPLY — 17 items
GLOVE BIO SURGEON STRL SZ8 (GLOVE) ×2 IMPLANT
GLOVE BIOGEL M 6.5 STRL (GLOVE) ×2 IMPLANT
GLOVE SURG LX 8.0 MICRO (GLOVE) ×1
GLOVE SURG LX STRL 8.0 MICRO (GLOVE) ×1 IMPLANT
GOWN STRL REUS W/ TWL LRG LVL3 (GOWN DISPOSABLE) ×2 IMPLANT
GOWN STRL REUS W/TWL LRG LVL3 (GOWN DISPOSABLE) ×2
LABEL CATARACT MEDS ST (LABEL) ×2 IMPLANT
LENS IOL +12 DIOP CNVXPLN 12.5 (Intraocular Lens) ×1 IMPLANT
LENS IOL KELMAN MULTIFLEX 12.0 (Intraocular Lens) ×1 IMPLANT
PACK CATARACT (MISCELLANEOUS) ×2 IMPLANT
PACK CATARACT BRASINGTON LX (MISCELLANEOUS) ×2 IMPLANT
PACK EYE AFTER SURG (MISCELLANEOUS) ×2 IMPLANT
SOL BSS BAG (MISCELLANEOUS) ×2
SOLUTION BSS BAG (MISCELLANEOUS) ×1 IMPLANT
SYR 5ML LL (SYRINGE) ×2 IMPLANT
WATER STERILE IRR 250ML POUR (IV SOLUTION) ×2 IMPLANT
WIPE NON LINTING 3.25X3.25 (MISCELLANEOUS) ×2 IMPLANT

## 2017-08-07 NOTE — Op Note (Signed)
PREOPERATIVE DIAGNOSIS:  Nuclear sclerotic cataract of the left eye.   POSTOPERATIVE DIAGNOSIS:  Nuclear sclerotic cataract of the left eye.   OPERATIVE PROCEDURE: Procedure(s): CATARACT EXTRACTION PHACO AND INTRAOCULAR LENS PLACEMENT (IOC)-LEFT   SURGEON:  Birder Robson, MD.   ANESTHESIA:  No anesthesia staff entered.  1.      Managed anesthesia care. 2.     0.20ml of Shugarcaine was instilled following the paracentesis   COMPLICATIONS:  None.   TECHNIQUE:   Stop and chop   DESCRIPTION OF PROCEDURE:  The patient was examined and consented in the preoperative holding area where the aforementioned topical anesthesia was applied to the left eye and then brought back to the Operating Room where the left eye was prepped and draped in the usual sterile ophthalmic fashion and a lid speculum was placed. A paracentesis was created with the side port blade and the anterior chamber was filled with viscoelastic. A near clear corneal incision was performed with the steel keratome. A continuous curvilinear capsulorrhexis was performed with a cystotome followed by the capsulorrhexis forceps. Hydrodissection and hydrodelineation were carried out with BSS on a blunt cannula. The lens was removed in a stop and chop  technique and the remaining cortical material was removed with the irrigation-aspiration handpiece. The capsular bag was inflated with viscoelastic and the Technis ZCB00 lens was placed in the capsular bag without complication. The remaining viscoelastic was removed from the eye with the irrigation-aspiration handpiece. The wounds were hydrated. The anterior chamber was flushed with Miostat and the eye was inflated to physiologic pressure. 0.62ml Vigamox was placed in the anterior chamber. The wounds were found to be water tight. The eye was dressed with Vigamox. The patient was given protective glasses to wear throughout the day and a shield with which to sleep tonight. The patient was also given  drops with which to begin a drop regimen today and will follow-up with me in one day. * No implants in log *  Procedure(s) with comments: CATARACT EXTRACTION PHACO AND INTRAOCULAR LENS PLACEMENT (IOC)-LEFT (Left) - [place]  00:43 AP% 15.0 CDE 6.50 Fluid pack lot # [id number]  Electronically signed: [place] 08/07/2017 9:44 AM

## 2017-08-07 NOTE — Anesthesia Preprocedure Evaluation (Signed)
Anesthesia Evaluation  Patient identified by MRN, date of birth, ID band Patient awake    Reviewed: Allergy & Precautions, NPO status , Patient's Chart, lab work & pertinent test results  History of Anesthesia Complications Negative for: history of anesthetic complications  Airway Mallampati: II  TM Distance: >3 FB Neck ROM: Full    Dental  (+) Missing, Poor Dentition   Pulmonary sleep apnea , neg COPD, former smoker,    breath sounds clear to auscultation- rhonchi (-) wheezing      Cardiovascular hypertension, Pt. on medications (-) CAD, (-) Past MI and (-) Cardiac Stents + dysrhythmias (paroxysmal afib) Atrial Fibrillation  Rhythm:Regular Rate:Normal - Systolic murmurs and - Diastolic murmurs    Neuro/Psych PSYCHIATRIC DISORDERS Depression negative neurological ROS     GI/Hepatic negative GI ROS, Neg liver ROS,   Endo/Other  negative endocrine ROSneg diabetes  Renal/GU negative Renal ROS     Musculoskeletal  (+) Arthritis ,   Abdominal (+) - obese,   Peds  Hematology negative hematology ROS (+)   Anesthesia Other Findings Past Medical History: No date: A-fib (HCC) No date: Arthritis No date: BPH (benign prostatic hyperplasia) No date: Depression No date: Heart murmur No date: HOH (hard of hearing) No date: Hypercholesteremia No date: Hypertension No date: Loss of equilibrium     Comment:  being evaluated at this time. No date: Pneumonia     Comment:  history of No date: Sleep apnea   Reproductive/Obstetrics                             Anesthesia Physical Anesthesia Plan  ASA: III  Anesthesia Plan: MAC   Post-op Pain Management:    Induction: Intravenous  PONV Risk Score and Plan: 1 and Midazolam  Airway Management Planned: Natural Airway  Additional Equipment:   Intra-op Plan:   Post-operative Plan:   Informed Consent: I have reviewed the patients History and  Physical, chart, labs and discussed the procedure including the risks, benefits and alternatives for the proposed anesthesia with the patient or authorized representative who has indicated his/her understanding and acceptance.     Plan Discussed with: CRNA and Anesthesiologist  Anesthesia Plan Comments:         Anesthesia Quick Evaluation

## 2017-08-07 NOTE — Transfer of Care (Signed)
Immediate Anesthesia Transfer of Care Note  Patient: Kyle Santos  Procedure(s) Performed: CATARACT EXTRACTION PHACO AND INTRAOCULAR LENS PLACEMENT (IOC)-LEFT (Left Eye)  Patient Location: PACU  Anesthesia Type:MAC  Level of Consciousness: awake, alert  and oriented  Airway & Oxygen Therapy: Patient Spontanous Breathing  Post-op Assessment: Report given to RN and Post -op Vital signs reviewed and stable  Post vital signs: Reviewed and stable  Last Vitals:  Vitals:   08/07/17 0819  BP: (!) 138/101  Pulse: 65  Resp: 17  Temp: (!) 36.4 C  SpO2: 98%    Last Pain:  Vitals:   08/07/17 0819  TempSrc: Oral         Complications: No apparent anesthesia complications

## 2017-08-07 NOTE — Discharge Instructions (Signed)
Eye Surgery Discharge Instructions  Expect mild scratchy sensation or mild soreness. DO NOT RUB YOUR EYE!  The day of surgery:  Minimal physical activity, but bed rest is not required  No reading, computer work, or close hand work  No bending, lifting, or straining.  May watch TV  For 24 hours:  No driving, legal decisions, or alcoholic beverages  Safety precautions  Eat anything you prefer: It is better to start with liquids, then soup then solid foods.  _____ Eye patch should be worn until postoperative exam tomorrow.  ____ Solar shield eyeglasses should be worn for comfort in the sunlight/patch while sleeping  Resume all regular medications including aspirin or Coumadin if these were discontinued prior to surgery. You may shower, bathe, shave, or wash your hair. Tylenol may be taken for mild discomfort.  Call your doctor if you experience significant pain, nausea, or vomiting, fever > 101 or other signs of infection. [phone] or [phone] Specific instructions:  Follow-up Information    Birder Robson, MD Follow up.   Specialty:  Ophthalmology Why:  December 19 at 10:05am Contact information: [address] [place] [address] [phone]

## 2017-08-07 NOTE — Anesthesia Procedure Notes (Signed)
Procedure Name: MAC Performed by: Illyria Sobocinski, CRNA Pre-anesthesia Checklist: Patient identified, Emergency Drugs available, Suction available, Timeout performed and Patient being monitored Oxygen Delivery Method: Nasal cannula       

## 2017-08-07 NOTE — Anesthesia Postprocedure Evaluation (Signed)
Anesthesia Post Note  Patient: Kyle Santos  Procedure(s) Performed: CATARACT EXTRACTION PHACO AND INTRAOCULAR LENS PLACEMENT (IOC)-LEFT (Left Eye)  Patient location during evaluation: PACU Anesthesia Type: MAC Level of consciousness: awake and alert and oriented Pain management: pain level controlled Vital Signs Assessment: post-procedure vital signs reviewed and stable Respiratory status: spontaneous breathing, nonlabored ventilation and respiratory function stable Cardiovascular status: blood pressure returned to baseline and stable Postop Assessment: no signs of nausea or vomiting Anesthetic complications: no     Last Vitals:  Vitals:   08/07/17 0943 08/07/17 1002  BP: (!) 142/77 (!) 146/80  Pulse: (!) 59   Resp: 16   Temp: 36.5 C   SpO2: 99%     Last Pain:  Vitals:   08/07/17 0819  TempSrc: Oral                 Ytzel Gubler

## 2017-08-07 NOTE — Anesthesia Post-op Follow-up Note (Signed)
Anesthesia QCDR form completed.        

## 2017-08-07 NOTE — H&P (Signed)
All labs reviewed. Abnormal studies sent to patients PCP when indicated.  Previous H&P reviewed, patient examined, there are NO CHANGES.  Kyle Santos LOUIS12/18/20189:18 AM

## 2017-08-09 ENCOUNTER — Encounter: Payer: Self-pay | Admitting: Ophthalmology

## 2017-08-23 DIAGNOSIS — I2510 Atherosclerotic heart disease of native coronary artery without angina pectoris: Secondary | ICD-10-CM | POA: Diagnosis not present

## 2017-08-23 DIAGNOSIS — I10 Essential (primary) hypertension: Secondary | ICD-10-CM | POA: Diagnosis not present

## 2017-08-23 DIAGNOSIS — R0602 Shortness of breath: Secondary | ICD-10-CM | POA: Diagnosis not present

## 2017-08-23 DIAGNOSIS — E782 Mixed hyperlipidemia: Secondary | ICD-10-CM | POA: Diagnosis not present

## 2017-08-23 DIAGNOSIS — R55 Syncope and collapse: Secondary | ICD-10-CM | POA: Diagnosis not present

## 2017-09-03 DIAGNOSIS — I059 Rheumatic mitral valve disease, unspecified: Secondary | ICD-10-CM | POA: Diagnosis not present

## 2017-09-03 DIAGNOSIS — G319 Degenerative disease of nervous system, unspecified: Secondary | ICD-10-CM | POA: Diagnosis not present

## 2017-09-03 DIAGNOSIS — I6990 Unspecified sequelae of unspecified cerebrovascular disease: Secondary | ICD-10-CM | POA: Diagnosis not present

## 2017-09-03 DIAGNOSIS — I208 Other forms of angina pectoris: Secondary | ICD-10-CM | POA: Diagnosis not present

## 2017-09-25 DIAGNOSIS — R079 Chest pain, unspecified: Secondary | ICD-10-CM | POA: Diagnosis not present

## 2017-09-28 DIAGNOSIS — R55 Syncope and collapse: Secondary | ICD-10-CM | POA: Diagnosis not present

## 2017-09-28 DIAGNOSIS — R42 Dizziness and giddiness: Secondary | ICD-10-CM | POA: Diagnosis not present

## 2017-09-28 DIAGNOSIS — I10 Essential (primary) hypertension: Secondary | ICD-10-CM | POA: Diagnosis not present

## 2017-09-28 DIAGNOSIS — I2510 Atherosclerotic heart disease of native coronary artery without angina pectoris: Secondary | ICD-10-CM | POA: Diagnosis not present

## 2017-09-28 DIAGNOSIS — I340 Nonrheumatic mitral (valve) insufficiency: Secondary | ICD-10-CM | POA: Diagnosis not present

## 2017-09-28 DIAGNOSIS — E782 Mixed hyperlipidemia: Secondary | ICD-10-CM | POA: Diagnosis not present

## 2017-09-28 DIAGNOSIS — G4730 Sleep apnea, unspecified: Secondary | ICD-10-CM | POA: Diagnosis not present

## 2017-10-01 DIAGNOSIS — E782 Mixed hyperlipidemia: Secondary | ICD-10-CM | POA: Diagnosis not present

## 2017-10-01 DIAGNOSIS — I2510 Atherosclerotic heart disease of native coronary artery without angina pectoris: Secondary | ICD-10-CM | POA: Diagnosis not present

## 2017-10-04 DIAGNOSIS — I059 Rheumatic mitral valve disease, unspecified: Secondary | ICD-10-CM | POA: Diagnosis not present

## 2017-10-04 DIAGNOSIS — I6990 Unspecified sequelae of unspecified cerebrovascular disease: Secondary | ICD-10-CM | POA: Diagnosis not present

## 2017-10-04 DIAGNOSIS — G319 Degenerative disease of nervous system, unspecified: Secondary | ICD-10-CM | POA: Diagnosis not present

## 2017-10-04 DIAGNOSIS — I9589 Other hypotension: Secondary | ICD-10-CM | POA: Diagnosis not present

## 2017-11-06 DIAGNOSIS — I059 Rheumatic mitral valve disease, unspecified: Secondary | ICD-10-CM | POA: Diagnosis not present

## 2017-11-06 DIAGNOSIS — I208 Other forms of angina pectoris: Secondary | ICD-10-CM | POA: Diagnosis not present

## 2017-11-06 DIAGNOSIS — G319 Degenerative disease of nervous system, unspecified: Secondary | ICD-10-CM | POA: Diagnosis not present

## 2017-11-06 DIAGNOSIS — I6990 Unspecified sequelae of unspecified cerebrovascular disease: Secondary | ICD-10-CM | POA: Diagnosis not present

## 2017-11-06 DIAGNOSIS — I9589 Other hypotension: Secondary | ICD-10-CM | POA: Diagnosis not present

## 2017-12-11 DIAGNOSIS — J310 Chronic rhinitis: Secondary | ICD-10-CM | POA: Diagnosis not present

## 2017-12-11 DIAGNOSIS — G319 Degenerative disease of nervous system, unspecified: Secondary | ICD-10-CM | POA: Diagnosis not present

## 2017-12-11 DIAGNOSIS — I059 Rheumatic mitral valve disease, unspecified: Secondary | ICD-10-CM | POA: Diagnosis not present

## 2017-12-11 DIAGNOSIS — I208 Other forms of angina pectoris: Secondary | ICD-10-CM | POA: Diagnosis not present

## 2017-12-27 DIAGNOSIS — I2510 Atherosclerotic heart disease of native coronary artery without angina pectoris: Secondary | ICD-10-CM | POA: Diagnosis not present

## 2017-12-27 DIAGNOSIS — R0602 Shortness of breath: Secondary | ICD-10-CM | POA: Diagnosis not present

## 2017-12-27 DIAGNOSIS — I10 Essential (primary) hypertension: Secondary | ICD-10-CM | POA: Diagnosis not present

## 2017-12-27 DIAGNOSIS — R55 Syncope and collapse: Secondary | ICD-10-CM | POA: Diagnosis not present

## 2017-12-27 DIAGNOSIS — E782 Mixed hyperlipidemia: Secondary | ICD-10-CM | POA: Diagnosis not present

## 2018-01-01 DIAGNOSIS — I2510 Atherosclerotic heart disease of native coronary artery without angina pectoris: Secondary | ICD-10-CM | POA: Diagnosis not present

## 2018-01-01 DIAGNOSIS — R42 Dizziness and giddiness: Secondary | ICD-10-CM | POA: Diagnosis not present

## 2018-01-01 DIAGNOSIS — I10 Essential (primary) hypertension: Secondary | ICD-10-CM | POA: Diagnosis not present

## 2018-01-01 DIAGNOSIS — I340 Nonrheumatic mitral (valve) insufficiency: Secondary | ICD-10-CM | POA: Diagnosis not present

## 2018-01-01 DIAGNOSIS — R072 Precordial pain: Secondary | ICD-10-CM | POA: Diagnosis not present

## 2018-01-01 DIAGNOSIS — E782 Mixed hyperlipidemia: Secondary | ICD-10-CM | POA: Diagnosis not present

## 2018-01-01 DIAGNOSIS — R55 Syncope and collapse: Secondary | ICD-10-CM | POA: Diagnosis not present

## 2018-01-02 DIAGNOSIS — R55 Syncope and collapse: Secondary | ICD-10-CM | POA: Diagnosis not present

## 2018-01-02 DIAGNOSIS — R072 Precordial pain: Secondary | ICD-10-CM | POA: Diagnosis not present

## 2018-01-02 DIAGNOSIS — E782 Mixed hyperlipidemia: Secondary | ICD-10-CM | POA: Diagnosis not present

## 2018-01-02 DIAGNOSIS — I2510 Atherosclerotic heart disease of native coronary artery without angina pectoris: Secondary | ICD-10-CM | POA: Diagnosis not present

## 2018-01-02 DIAGNOSIS — I340 Nonrheumatic mitral (valve) insufficiency: Secondary | ICD-10-CM | POA: Diagnosis not present

## 2018-01-02 DIAGNOSIS — I10 Essential (primary) hypertension: Secondary | ICD-10-CM | POA: Diagnosis not present

## 2018-01-02 DIAGNOSIS — R42 Dizziness and giddiness: Secondary | ICD-10-CM | POA: Diagnosis not present

## 2018-01-04 DIAGNOSIS — R072 Precordial pain: Secondary | ICD-10-CM | POA: Diagnosis not present

## 2018-01-04 DIAGNOSIS — I10 Essential (primary) hypertension: Secondary | ICD-10-CM | POA: Diagnosis not present

## 2018-01-04 DIAGNOSIS — R55 Syncope and collapse: Secondary | ICD-10-CM | POA: Diagnosis not present

## 2018-01-04 DIAGNOSIS — R42 Dizziness and giddiness: Secondary | ICD-10-CM | POA: Diagnosis not present

## 2018-01-04 DIAGNOSIS — I340 Nonrheumatic mitral (valve) insufficiency: Secondary | ICD-10-CM | POA: Diagnosis not present

## 2018-01-04 DIAGNOSIS — I2510 Atherosclerotic heart disease of native coronary artery without angina pectoris: Secondary | ICD-10-CM | POA: Diagnosis not present

## 2018-01-04 DIAGNOSIS — E782 Mixed hyperlipidemia: Secondary | ICD-10-CM | POA: Diagnosis not present

## 2018-01-09 DIAGNOSIS — R42 Dizziness and giddiness: Secondary | ICD-10-CM | POA: Diagnosis not present

## 2018-01-10 DIAGNOSIS — M75100 Unspecified rotator cuff tear or rupture of unspecified shoulder, not specified as traumatic: Secondary | ICD-10-CM | POA: Diagnosis not present

## 2018-01-10 DIAGNOSIS — G319 Degenerative disease of nervous system, unspecified: Secondary | ICD-10-CM | POA: Diagnosis not present

## 2018-01-10 DIAGNOSIS — I059 Rheumatic mitral valve disease, unspecified: Secondary | ICD-10-CM | POA: Diagnosis not present

## 2018-01-10 DIAGNOSIS — G4731 Primary central sleep apnea: Secondary | ICD-10-CM | POA: Diagnosis not present

## 2018-01-11 DIAGNOSIS — I10 Essential (primary) hypertension: Secondary | ICD-10-CM | POA: Diagnosis not present

## 2018-01-11 DIAGNOSIS — E782 Mixed hyperlipidemia: Secondary | ICD-10-CM | POA: Diagnosis not present

## 2018-01-11 DIAGNOSIS — I2510 Atherosclerotic heart disease of native coronary artery without angina pectoris: Secondary | ICD-10-CM | POA: Diagnosis not present

## 2018-01-11 DIAGNOSIS — R0602 Shortness of breath: Secondary | ICD-10-CM | POA: Diagnosis not present

## 2018-02-07 DIAGNOSIS — R5381 Other malaise: Secondary | ICD-10-CM | POA: Diagnosis not present

## 2018-02-07 DIAGNOSIS — G4731 Primary central sleep apnea: Secondary | ICD-10-CM | POA: Diagnosis not present

## 2018-02-07 DIAGNOSIS — I059 Rheumatic mitral valve disease, unspecified: Secondary | ICD-10-CM | POA: Diagnosis not present

## 2018-02-07 DIAGNOSIS — G4711 Idiopathic hypersomnia with long sleep time: Secondary | ICD-10-CM | POA: Diagnosis not present

## 2018-02-26 ENCOUNTER — Ambulatory Visit: Payer: Medicare HMO

## 2018-03-08 DIAGNOSIS — I059 Rheumatic mitral valve disease, unspecified: Secondary | ICD-10-CM | POA: Diagnosis not present

## 2018-03-08 DIAGNOSIS — R5381 Other malaise: Secondary | ICD-10-CM | POA: Diagnosis not present

## 2018-03-08 DIAGNOSIS — G4711 Idiopathic hypersomnia with long sleep time: Secondary | ICD-10-CM | POA: Diagnosis not present

## 2018-03-08 DIAGNOSIS — G319 Degenerative disease of nervous system, unspecified: Secondary | ICD-10-CM | POA: Diagnosis not present

## 2018-03-13 DIAGNOSIS — Z961 Presence of intraocular lens: Secondary | ICD-10-CM | POA: Diagnosis not present

## 2018-03-13 DIAGNOSIS — H43813 Vitreous degeneration, bilateral: Secondary | ICD-10-CM | POA: Diagnosis not present

## 2018-03-21 IMAGING — MR MR HEAD WO/W CM
11 of 12 series · 35 of 48 positions shown · IV contrast (15 ML MULTIHANCE)
Comparison: Head CT without contrast 04/26/2016. Brain MRI
03/23/1979 [DATE], and earlier.

CLINICAL DATA: 81-year-old male with right side hearing loss.
Dizziness. Symptoms for 1 year.

EXAM:
MRI HEAD WITHOUT AND WITH CONTRAST
TECHNIQUE: Multiplanar, multiecho pulse sequences of the brain and surrounding
structures were obtained without and with intravenous contrast.
CONTRAST:  15mL MULTIHANCE GADOBENATE DIMEGLUMINE 529 MG/ML IV SOLN

[Series 2: T1 · sagittal · 5.0mm · 0.45mm/px · 2 of 29 slices shown (1 of 3)]
[im 1/29]
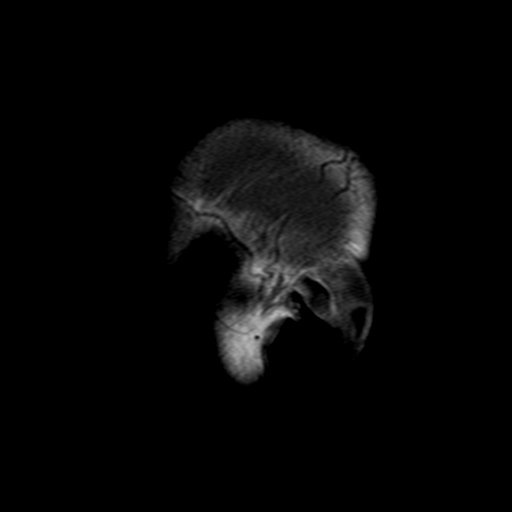
[im 29/29]
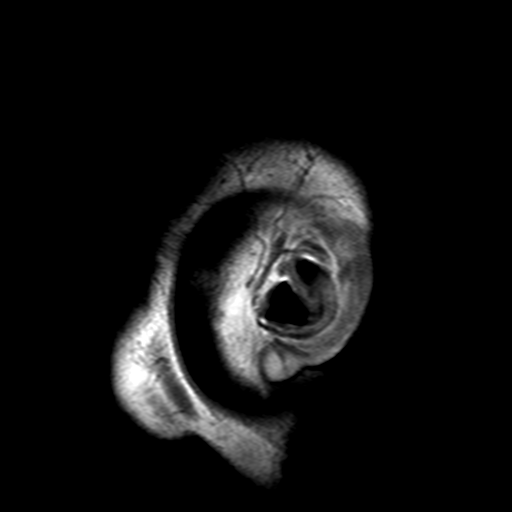

[Series 4: DWI · axial · 4.0mm · 0.94mm/px · z∈[-59,+113]mm · 4 of 45 slices shown (1 of 2)]
[im 1/45]
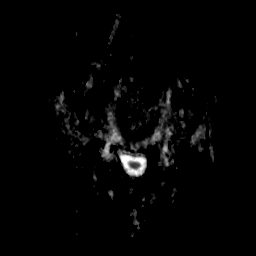
[im 15/45]
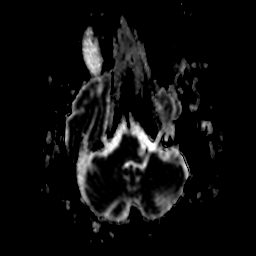
[im 30/45]
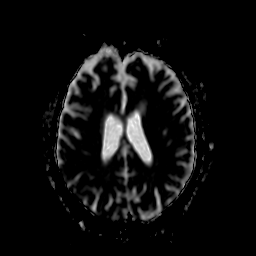
[im 45/45]
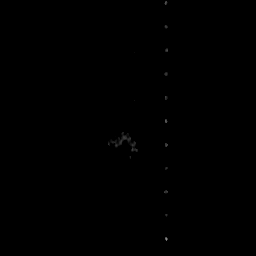

[Series 5: DWI · axial · 4.0mm · 0.94mm/px · z∈[-59,+109]mm · 4 of 44 slices shown (2 of 2)]
[im 1/44]
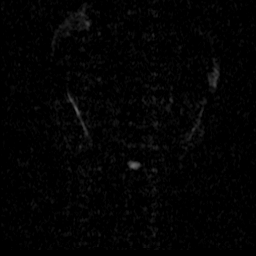
[im 15/44]
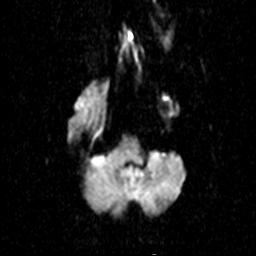
[im 29/44]
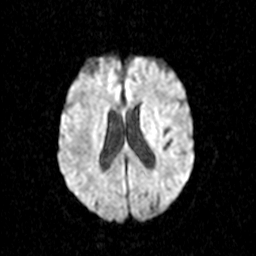
[im 44/44]
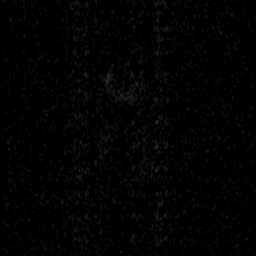

[Series 6: T2 · axial · 5.0mm · 0.45mm/px · z∈[-55,+109]mm · 2 of 25 slices shown (1 of 2)]
[im 1/25]
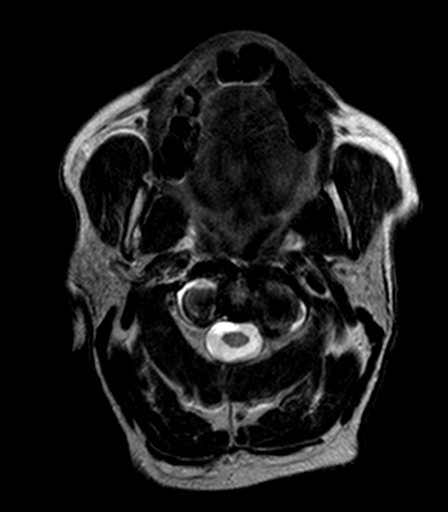
[im 25/25]
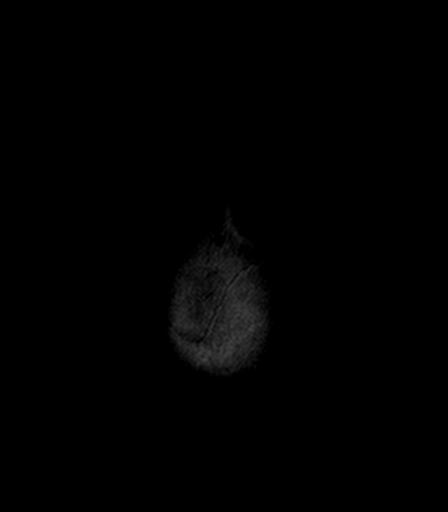

[Series 7: FLAIR · axial · 3.0mm · 0.90mm/px · z∈[-46,+106]mm · 5 of 53 slices shown]
[im 1/53]
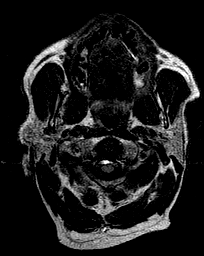
[im 14/53]
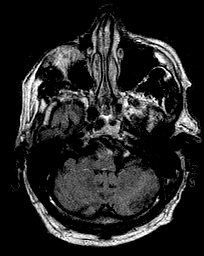
[im 27/53]
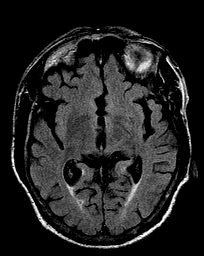
[im 40/53]
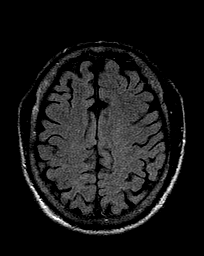
[im 53/53]
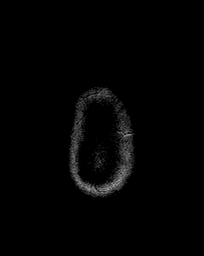

[Series 8: T1 · coronal · 3.0mm · 0.70mm/px · 2 of 19 slices shown (2 of 3)]
[im 1/19]
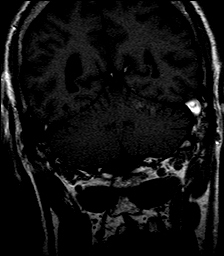
[im 19/19]
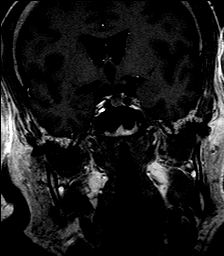

[Series 9: T2 · axial · 5.0mm · 0.45mm/px · z∈[-47,+105]mm · 3 of 27 slices shown (2 of 2)]
[im 1/27]
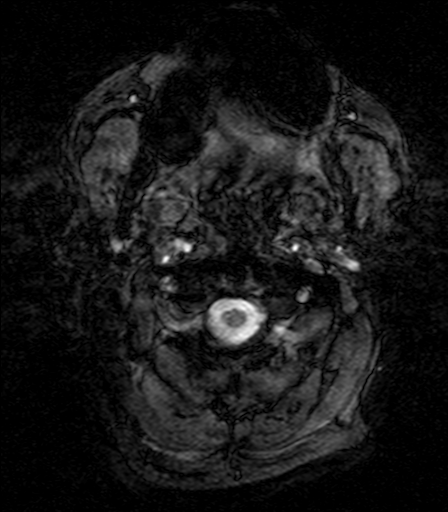
[im 14/27]
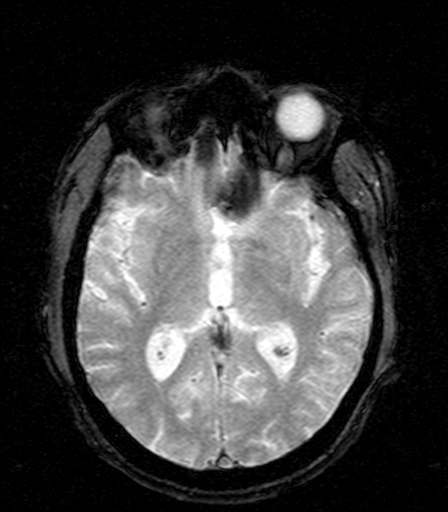
[im 27/27]
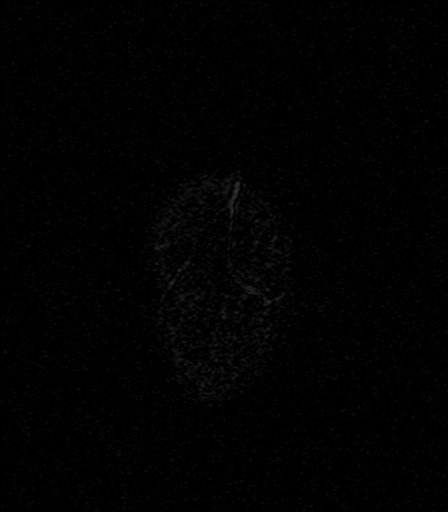

[Series 10: T1 · axial · 3.0mm · 0.70mm/px · 1 of 19 slices shown (3 of 3)]
[im 1/19]
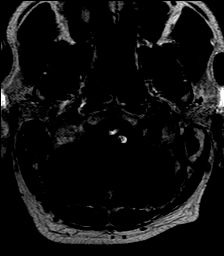

[Series 12: T1 post-contrast · coronal · 3.0mm · 0.70mm/px · 2 of 19 slices shown (1 of 3)]
[im 1/19]
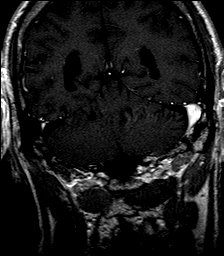
[im 19/19]
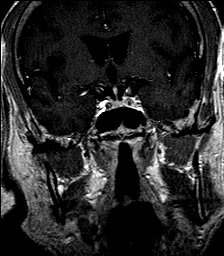

[Series 13: T1 post-contrast · axial · 3.0mm · 0.70mm/px · z∈[-45,+7]mm · 2 of 19 slices shown (2 of 3)]
[im 1/19]
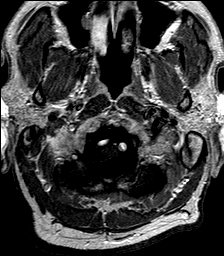
[im 19/19]
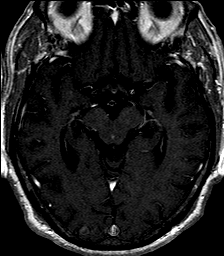

[Series 14: T1 post-contrast · axial · 1.0mm · 0.45mm/px · z∈[-48,+107]mm · 8 of 160 slices shown (3 of 3)]
[im 1/160]
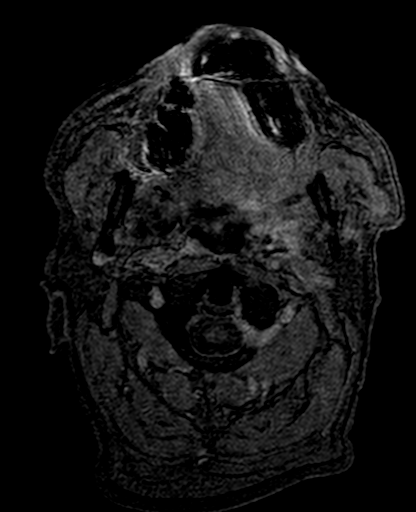
[im 23/160]
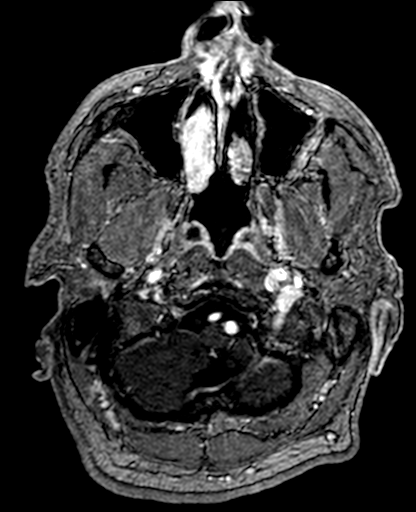
[im 46/160]
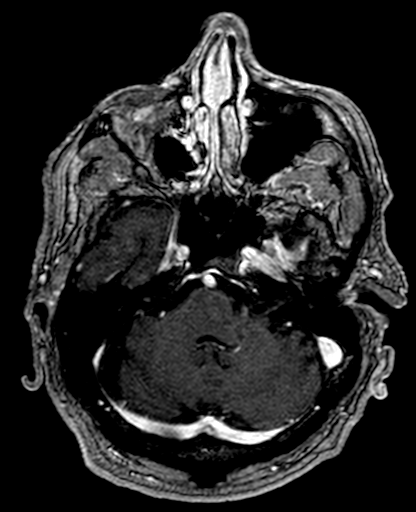
[im 69/160]
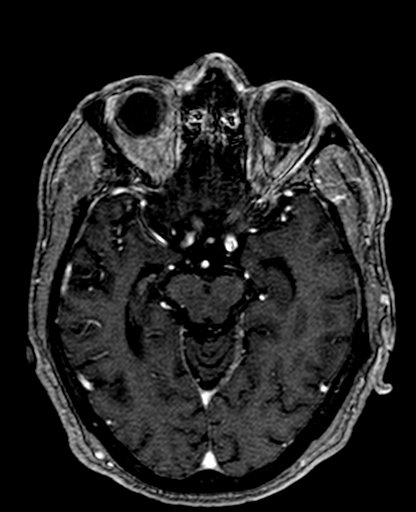
[im 91/160]
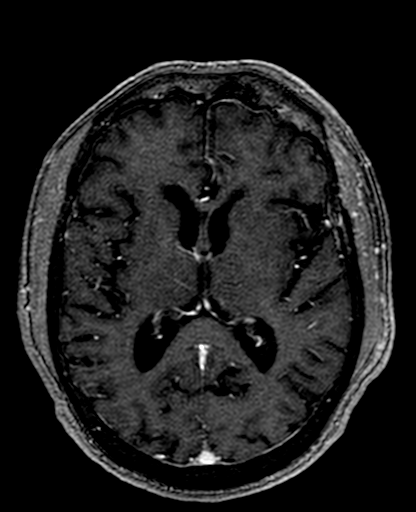
[im 114/160]
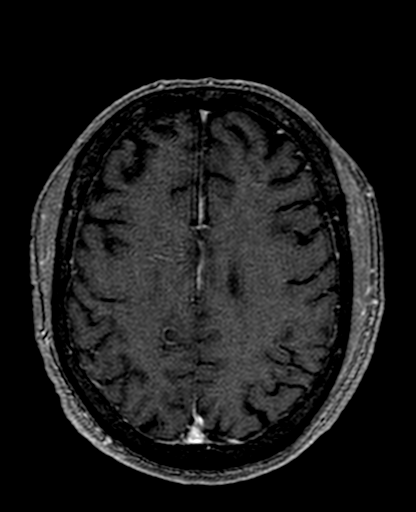
[im 137/160]
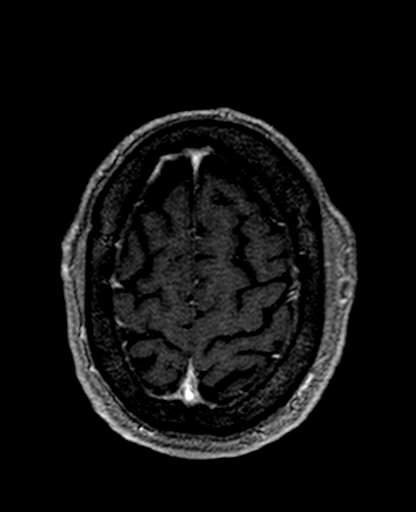
[im 160/160]
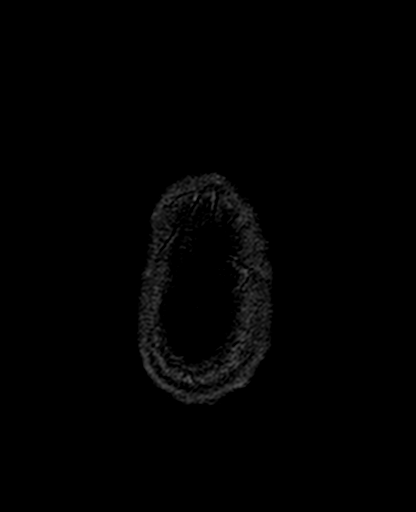

[35 of 48 positions shown; findings below may reference images not displayed]

FINDINGS: Brain: Mild generalized cerebral volume loss since 8684. Overall
volume remains normal for age. No restricted diffusion to suggest
acute infarction. No midline shift, mass effect, ventriculomegaly,
extra-axial collection or acute intracranial hemorrhage.
Cervicomedullary junction and pituitary are within normal limits.

Gray and white matter signal has not significantly changed since
8684 and remains within normal limits for age. No cortical
encephalomalacia or chronic cerebral blood products identified.
Excluding the internal auditory findings, no abnormal intracranial
enhancement

Vascular: Major intracranial vascular flow voids are stable since
8684, including mild dolichoectasia of the dominant distal left
vertebral artery.

Skull and upper cervical spine: Negative. Visualized bone marrow
signal is within normal limits.

Sinuses/Orbits: Interval postoperative changes to the right globe,
otherwise stable and negative. Negative scalp soft tissue findings.

Other: Dedicated internal auditory canal imaging. Homogeneously
enhancing, circumscribed mass within the right internal auditory
canal measures 4 x 9 x 4 mm (AP by transverse by CC). The mass
appears to extend to the right IAC fundus, although there is no
cochlea or vestibule involvement identified. The cisternal right
seventh and eighth cranial nerves segments remain normal along with
the right cerebellopontine angle.

The contralateral left cerebellopontine angle and seventh/eighth
cranial nerves are normal aside from mild mass effect near the left
seventh and eighth cranial nerve root entry zone from the
dolichoectatic distal left vertebral artery. Left cochlea and
vestibular structures appear normal. No abnormal enhancement on the
left side.

Mastoids are clear. Stylomastoid foramina appear normal. Visible
parotid glands appear normal. No other skullbase abnormality.
IMPRESSION: 1. Positive for a 4 x 9 mm enhancing right intracanalicular mass
most compatible with Right Vestibular Schwannoma. No
cochlea/vestibule or right cerebellopontine angle involvement.
2. Negative left IAC imaging except for mild mass effect on the left
CP angle from the dominant, dolichoectatic distal left vertebral
artery.
3. Stable and negative for age brain parenchyma.

## 2018-03-26 DIAGNOSIS — R0602 Shortness of breath: Secondary | ICD-10-CM | POA: Diagnosis not present

## 2018-03-26 DIAGNOSIS — I2510 Atherosclerotic heart disease of native coronary artery without angina pectoris: Secondary | ICD-10-CM | POA: Diagnosis not present

## 2018-03-26 DIAGNOSIS — E782 Mixed hyperlipidemia: Secondary | ICD-10-CM | POA: Diagnosis not present

## 2018-03-26 DIAGNOSIS — R55 Syncope and collapse: Secondary | ICD-10-CM | POA: Diagnosis not present

## 2018-03-26 DIAGNOSIS — I10 Essential (primary) hypertension: Secondary | ICD-10-CM | POA: Diagnosis not present

## 2018-04-08 DIAGNOSIS — I059 Rheumatic mitral valve disease, unspecified: Secondary | ICD-10-CM | POA: Diagnosis not present

## 2018-04-08 DIAGNOSIS — S0101XA Laceration without foreign body of scalp, initial encounter: Secondary | ICD-10-CM | POA: Diagnosis not present

## 2018-04-08 DIAGNOSIS — Z23 Encounter for immunization: Secondary | ICD-10-CM | POA: Diagnosis not present

## 2018-04-08 DIAGNOSIS — I6990 Unspecified sequelae of unspecified cerebrovascular disease: Secondary | ICD-10-CM | POA: Diagnosis not present

## 2018-04-08 DIAGNOSIS — Z96619 Presence of unspecified artificial shoulder joint: Secondary | ICD-10-CM | POA: Diagnosis not present

## 2018-05-09 DIAGNOSIS — I059 Rheumatic mitral valve disease, unspecified: Secondary | ICD-10-CM | POA: Diagnosis not present

## 2018-05-09 DIAGNOSIS — R413 Other amnesia: Secondary | ICD-10-CM | POA: Diagnosis not present

## 2018-05-09 DIAGNOSIS — Z96619 Presence of unspecified artificial shoulder joint: Secondary | ICD-10-CM | POA: Diagnosis not present

## 2018-05-09 DIAGNOSIS — I6990 Unspecified sequelae of unspecified cerebrovascular disease: Secondary | ICD-10-CM | POA: Diagnosis not present

## 2018-06-10 DIAGNOSIS — R413 Other amnesia: Secondary | ICD-10-CM | POA: Diagnosis not present

## 2018-06-10 DIAGNOSIS — M75100 Unspecified rotator cuff tear or rupture of unspecified shoulder, not specified as traumatic: Secondary | ICD-10-CM | POA: Diagnosis not present

## 2018-06-10 DIAGNOSIS — Z96619 Presence of unspecified artificial shoulder joint: Secondary | ICD-10-CM | POA: Diagnosis not present

## 2018-06-10 DIAGNOSIS — I208 Other forms of angina pectoris: Secondary | ICD-10-CM | POA: Diagnosis not present

## 2018-06-10 DIAGNOSIS — I059 Rheumatic mitral valve disease, unspecified: Secondary | ICD-10-CM | POA: Diagnosis not present

## 2018-07-15 DIAGNOSIS — G319 Degenerative disease of nervous system, unspecified: Secondary | ICD-10-CM | POA: Diagnosis not present

## 2018-07-15 DIAGNOSIS — R5381 Other malaise: Secondary | ICD-10-CM | POA: Diagnosis not present

## 2018-07-15 DIAGNOSIS — I059 Rheumatic mitral valve disease, unspecified: Secondary | ICD-10-CM | POA: Diagnosis not present

## 2018-07-15 DIAGNOSIS — R413 Other amnesia: Secondary | ICD-10-CM | POA: Diagnosis not present

## 2018-08-23 DIAGNOSIS — G319 Degenerative disease of nervous system, unspecified: Secondary | ICD-10-CM | POA: Diagnosis not present

## 2018-08-23 DIAGNOSIS — R413 Other amnesia: Secondary | ICD-10-CM | POA: Diagnosis not present

## 2018-08-23 DIAGNOSIS — I4892 Unspecified atrial flutter: Secondary | ICD-10-CM | POA: Diagnosis not present

## 2018-08-23 DIAGNOSIS — I059 Rheumatic mitral valve disease, unspecified: Secondary | ICD-10-CM | POA: Diagnosis not present

## 2018-08-23 DIAGNOSIS — I4891 Unspecified atrial fibrillation: Secondary | ICD-10-CM | POA: Diagnosis not present

## 2018-08-23 DIAGNOSIS — R5381 Other malaise: Secondary | ICD-10-CM | POA: Diagnosis not present

## 2018-10-01 DIAGNOSIS — R5381 Other malaise: Secondary | ICD-10-CM | POA: Diagnosis not present

## 2018-10-01 DIAGNOSIS — I059 Rheumatic mitral valve disease, unspecified: Secondary | ICD-10-CM | POA: Diagnosis not present

## 2018-10-01 DIAGNOSIS — R413 Other amnesia: Secondary | ICD-10-CM | POA: Diagnosis not present

## 2018-10-01 DIAGNOSIS — G319 Degenerative disease of nervous system, unspecified: Secondary | ICD-10-CM | POA: Diagnosis not present

## 2018-10-03 DIAGNOSIS — I340 Nonrheumatic mitral (valve) insufficiency: Secondary | ICD-10-CM | POA: Diagnosis not present

## 2018-10-03 DIAGNOSIS — E785 Hyperlipidemia, unspecified: Secondary | ICD-10-CM | POA: Diagnosis not present

## 2018-10-03 DIAGNOSIS — E782 Mixed hyperlipidemia: Secondary | ICD-10-CM | POA: Diagnosis not present

## 2018-10-03 DIAGNOSIS — I351 Nonrheumatic aortic (valve) insufficiency: Secondary | ICD-10-CM | POA: Diagnosis not present

## 2018-10-03 DIAGNOSIS — I10 Essential (primary) hypertension: Secondary | ICD-10-CM | POA: Diagnosis not present

## 2018-10-03 DIAGNOSIS — I2510 Atherosclerotic heart disease of native coronary artery without angina pectoris: Secondary | ICD-10-CM | POA: Diagnosis not present

## 2018-10-03 DIAGNOSIS — R0602 Shortness of breath: Secondary | ICD-10-CM | POA: Diagnosis not present

## 2018-10-10 DIAGNOSIS — I351 Nonrheumatic aortic (valve) insufficiency: Secondary | ICD-10-CM | POA: Diagnosis not present

## 2018-11-05 DIAGNOSIS — I059 Rheumatic mitral valve disease, unspecified: Secondary | ICD-10-CM | POA: Diagnosis not present

## 2018-11-05 DIAGNOSIS — R5381 Other malaise: Secondary | ICD-10-CM | POA: Diagnosis not present

## 2018-11-05 DIAGNOSIS — G319 Degenerative disease of nervous system, unspecified: Secondary | ICD-10-CM | POA: Diagnosis not present

## 2018-11-05 DIAGNOSIS — R413 Other amnesia: Secondary | ICD-10-CM | POA: Diagnosis not present

## 2018-12-06 DIAGNOSIS — M545 Low back pain: Secondary | ICD-10-CM | POA: Diagnosis not present

## 2018-12-06 DIAGNOSIS — G319 Degenerative disease of nervous system, unspecified: Secondary | ICD-10-CM | POA: Diagnosis not present

## 2018-12-06 DIAGNOSIS — I059 Rheumatic mitral valve disease, unspecified: Secondary | ICD-10-CM | POA: Diagnosis not present

## 2018-12-06 DIAGNOSIS — I10 Essential (primary) hypertension: Secondary | ICD-10-CM | POA: Diagnosis not present

## 2018-12-06 DIAGNOSIS — I6990 Unspecified sequelae of unspecified cerebrovascular disease: Secondary | ICD-10-CM | POA: Diagnosis not present

## 2019-01-31 DIAGNOSIS — E782 Mixed hyperlipidemia: Secondary | ICD-10-CM | POA: Diagnosis not present

## 2019-01-31 DIAGNOSIS — R0602 Shortness of breath: Secondary | ICD-10-CM | POA: Diagnosis not present

## 2019-01-31 DIAGNOSIS — I2510 Atherosclerotic heart disease of native coronary artery without angina pectoris: Secondary | ICD-10-CM | POA: Diagnosis not present

## 2019-01-31 DIAGNOSIS — I10 Essential (primary) hypertension: Secondary | ICD-10-CM | POA: Diagnosis not present

## 2019-01-31 DIAGNOSIS — I824Z3 Acute embolism and thrombosis of unspecified deep veins of distal lower extremity, bilateral: Secondary | ICD-10-CM | POA: Diagnosis not present

## 2019-02-04 DIAGNOSIS — I824Z3 Acute embolism and thrombosis of unspecified deep veins of distal lower extremity, bilateral: Secondary | ICD-10-CM | POA: Diagnosis not present

## 2019-02-04 DIAGNOSIS — R0602 Shortness of breath: Secondary | ICD-10-CM | POA: Diagnosis not present

## 2019-02-07 DIAGNOSIS — I2510 Atherosclerotic heart disease of native coronary artery without angina pectoris: Secondary | ICD-10-CM | POA: Diagnosis not present

## 2019-02-07 DIAGNOSIS — R0602 Shortness of breath: Secondary | ICD-10-CM | POA: Diagnosis not present

## 2019-02-07 DIAGNOSIS — E782 Mixed hyperlipidemia: Secondary | ICD-10-CM | POA: Diagnosis not present

## 2019-02-07 DIAGNOSIS — I10 Essential (primary) hypertension: Secondary | ICD-10-CM | POA: Diagnosis not present

## 2019-02-10 DIAGNOSIS — I6990 Unspecified sequelae of unspecified cerebrovascular disease: Secondary | ICD-10-CM | POA: Diagnosis not present

## 2019-02-10 DIAGNOSIS — M545 Low back pain: Secondary | ICD-10-CM | POA: Diagnosis not present

## 2019-02-10 DIAGNOSIS — I059 Rheumatic mitral valve disease, unspecified: Secondary | ICD-10-CM | POA: Diagnosis not present

## 2019-02-10 DIAGNOSIS — G319 Degenerative disease of nervous system, unspecified: Secondary | ICD-10-CM | POA: Diagnosis not present

## 2019-02-10 DIAGNOSIS — I10 Essential (primary) hypertension: Secondary | ICD-10-CM | POA: Diagnosis not present

## 2019-03-13 DIAGNOSIS — I6990 Unspecified sequelae of unspecified cerebrovascular disease: Secondary | ICD-10-CM | POA: Diagnosis not present

## 2019-03-13 DIAGNOSIS — G319 Degenerative disease of nervous system, unspecified: Secondary | ICD-10-CM | POA: Diagnosis not present

## 2019-03-13 DIAGNOSIS — I059 Rheumatic mitral valve disease, unspecified: Secondary | ICD-10-CM | POA: Diagnosis not present

## 2019-03-13 DIAGNOSIS — I10 Essential (primary) hypertension: Secondary | ICD-10-CM | POA: Diagnosis not present

## 2019-03-13 DIAGNOSIS — M545 Low back pain: Secondary | ICD-10-CM | POA: Diagnosis not present

## 2019-04-22 DIAGNOSIS — G319 Degenerative disease of nervous system, unspecified: Secondary | ICD-10-CM | POA: Diagnosis not present

## 2019-04-22 DIAGNOSIS — M545 Low back pain: Secondary | ICD-10-CM | POA: Diagnosis not present

## 2019-04-22 DIAGNOSIS — I059 Rheumatic mitral valve disease, unspecified: Secondary | ICD-10-CM | POA: Diagnosis not present

## 2019-04-22 DIAGNOSIS — I10 Essential (primary) hypertension: Secondary | ICD-10-CM | POA: Diagnosis not present

## 2019-05-28 ENCOUNTER — Other Ambulatory Visit: Payer: Self-pay

## 2019-05-28 ENCOUNTER — Other Ambulatory Visit: Payer: Self-pay | Admitting: Internal Medicine

## 2019-05-28 ENCOUNTER — Ambulatory Visit
Admission: RE | Admit: 2019-05-28 | Discharge: 2019-05-28 | Disposition: A | Discharge status: Home / Self Care | Payer: Medicare HMO | Source: Ambulatory Visit | Attending: Internal Medicine | Admitting: Internal Medicine

## 2019-05-28 DIAGNOSIS — I059 Rheumatic mitral valve disease, unspecified: Secondary | ICD-10-CM | POA: Diagnosis not present

## 2019-05-28 DIAGNOSIS — R52 Pain, unspecified: Secondary | ICD-10-CM

## 2019-05-28 DIAGNOSIS — G319 Degenerative disease of nervous system, unspecified: Secondary | ICD-10-CM | POA: Diagnosis not present

## 2019-05-28 DIAGNOSIS — M545 Low back pain: Secondary | ICD-10-CM | POA: Diagnosis not present

## 2019-06-03 DIAGNOSIS — Z8673 Personal history of transient ischemic attack (TIA), and cerebral infarction without residual deficits: Secondary | ICD-10-CM | POA: Diagnosis not present

## 2019-06-03 DIAGNOSIS — M545 Low back pain: Secondary | ICD-10-CM | POA: Diagnosis not present

## 2019-06-03 DIAGNOSIS — G319 Degenerative disease of nervous system, unspecified: Secondary | ICD-10-CM | POA: Diagnosis not present

## 2019-06-03 DIAGNOSIS — I059 Rheumatic mitral valve disease, unspecified: Secondary | ICD-10-CM | POA: Diagnosis not present

## 2019-06-27 DIAGNOSIS — E782 Mixed hyperlipidemia: Secondary | ICD-10-CM | POA: Diagnosis not present

## 2019-06-27 DIAGNOSIS — I480 Paroxysmal atrial fibrillation: Secondary | ICD-10-CM | POA: Diagnosis not present

## 2019-06-27 DIAGNOSIS — I2510 Atherosclerotic heart disease of native coronary artery without angina pectoris: Secondary | ICD-10-CM | POA: Diagnosis not present

## 2019-06-27 DIAGNOSIS — I10 Essential (primary) hypertension: Secondary | ICD-10-CM | POA: Diagnosis not present

## 2019-06-27 DIAGNOSIS — R0602 Shortness of breath: Secondary | ICD-10-CM | POA: Diagnosis not present

## 2019-06-30 DIAGNOSIS — M791 Myalgia, unspecified site: Secondary | ICD-10-CM | POA: Diagnosis not present

## 2019-06-30 DIAGNOSIS — M4727 Other spondylosis with radiculopathy, lumbosacral region: Secondary | ICD-10-CM | POA: Diagnosis not present

## 2019-07-02 ENCOUNTER — Other Ambulatory Visit: Payer: Self-pay

## 2019-07-02 ENCOUNTER — Emergency Department: Payer: Medicare HMO

## 2019-07-02 ENCOUNTER — Emergency Department
Admission: EM | Admit: 2019-07-02 | Discharge: 2019-07-02 | Disposition: A | Discharge status: Home / Self Care | Payer: Medicare HMO | Attending: Emergency Medicine | Admitting: Emergency Medicine

## 2019-07-02 ENCOUNTER — Encounter: Payer: Self-pay | Admitting: Emergency Medicine

## 2019-07-02 DIAGNOSIS — I10 Essential (primary) hypertension: Secondary | ICD-10-CM | POA: Diagnosis not present

## 2019-07-02 DIAGNOSIS — Z5321 Procedure and treatment not carried out due to patient leaving prior to being seen by health care provider: Secondary | ICD-10-CM | POA: Diagnosis not present

## 2019-07-02 DIAGNOSIS — R0602 Shortness of breath: Secondary | ICD-10-CM | POA: Diagnosis not present

## 2019-07-02 DIAGNOSIS — R270 Ataxia, unspecified: Secondary | ICD-10-CM | POA: Insufficient documentation

## 2019-07-02 DIAGNOSIS — R457 State of emotional shock and stress, unspecified: Secondary | ICD-10-CM | POA: Diagnosis not present

## 2019-07-02 DIAGNOSIS — R000 Tachycardia, unspecified: Secondary | ICD-10-CM | POA: Diagnosis not present

## 2019-07-02 DIAGNOSIS — S0990XA Unspecified injury of head, initial encounter: Secondary | ICD-10-CM | POA: Diagnosis not present

## 2019-07-02 LAB — BASIC METABOLIC PANEL
Anion gap: 9 (ref 5–15)
BUN: 20 mg/dL (ref 8–23)
CO2: 27 mmol/L (ref 22–32)
Calcium: 9.2 mg/dL (ref 8.9–10.3)
Chloride: 105 mmol/L (ref 98–111)
Creatinine, Ser: 1.25 mg/dL — ABNORMAL HIGH (ref 0.61–1.24)
GFR calc Af Amer: >60 mL/min (ref >60)
GFR calc non Af Amer: 54 mL/min — ABNORMAL LOW (ref >60)
Glucose, Bld: 94 mg/dL (ref 70–99)
Potassium: 3.9 mmol/L (ref 3.5–5.1)
Sodium: 141 mmol/L (ref 135–145)

## 2019-07-02 LAB — CBC
HCT: 42.2 % (ref 39.0–52.0)
Hemoglobin: 14.5 g/dL (ref 13.0–17.0)
MCH: 32 pg (ref 26.0–34.0)
MCHC: 34.4 g/dL (ref 30.0–36.0)
MCV: 93.2 fL (ref 80.0–100.0)
Platelets: 163 10*3/uL (ref 150–400)
RBC: 4.53 MIL/uL (ref 4.22–5.81)
RDW: 13 % (ref 11.5–15.5)
WBC: 8.5 10*3/uL (ref 4.0–10.5)
nRBC: 0 % (ref 0.0–0.2)

## 2019-07-02 LAB — TROPONIN I (HIGH SENSITIVITY): Troponin I (High Sensitivity): 5 ng/L (ref <18)

## 2019-07-02 LAB — PROTIME-INR
INR: 1.9 — ABNORMAL HIGH (ref 0.8–1.2)
Prothrombin Time: 21.1 seconds — ABNORMAL HIGH (ref 11.4–15.2)

## 2019-07-02 NOTE — ED Notes (Signed)
Pt unaware if he had head injury with fall last night, pt on coumidin. This RN spoke with MD Paduchowski , see verbal order for CT  Head

## 2019-07-02 NOTE — ED Triage Notes (Addendum)
Pt from home via EMS with c/o possible SOB. PT state anxiety and felt like his throat was "thickening". Pt denies any pain, states slight SOB at this time. PT in NAD, RR even and unlabored. Speaking in full sentences.   Pt states he feels "fait", pt states he fell in shower last night. Denies LOC or head injury. States he started feeling "faint" prior to fall. Pt takes coumadin

## 2019-07-02 NOTE — ED Notes (Signed)
Family noted bringing pt in clothes; pt ambulatory to [facility] and changed from [place] into own clothing, left ED lobby and got into vehicle leaving

## 2019-07-07 DIAGNOSIS — I059 Rheumatic mitral valve disease, unspecified: Secondary | ICD-10-CM | POA: Diagnosis not present

## 2019-07-07 DIAGNOSIS — Z8673 Personal history of transient ischemic attack (TIA), and cerebral infarction without residual deficits: Secondary | ICD-10-CM | POA: Diagnosis not present

## 2019-07-07 DIAGNOSIS — G319 Degenerative disease of nervous system, unspecified: Secondary | ICD-10-CM | POA: Diagnosis not present

## 2019-07-07 DIAGNOSIS — M545 Low back pain: Secondary | ICD-10-CM | POA: Diagnosis not present

## 2019-08-04 DIAGNOSIS — I059 Rheumatic mitral valve disease, unspecified: Secondary | ICD-10-CM | POA: Diagnosis not present

## 2019-08-04 DIAGNOSIS — Z8673 Personal history of transient ischemic attack (TIA), and cerebral infarction without residual deficits: Secondary | ICD-10-CM | POA: Diagnosis not present

## 2019-08-04 DIAGNOSIS — M545 Low back pain: Secondary | ICD-10-CM | POA: Diagnosis not present

## 2019-08-04 DIAGNOSIS — G319 Degenerative disease of nervous system, unspecified: Secondary | ICD-10-CM | POA: Diagnosis not present

## 2019-09-04 DIAGNOSIS — M545 Low back pain: Secondary | ICD-10-CM | POA: Diagnosis not present

## 2019-09-04 DIAGNOSIS — Z8673 Personal history of transient ischemic attack (TIA), and cerebral infarction without residual deficits: Secondary | ICD-10-CM | POA: Diagnosis not present

## 2019-09-04 DIAGNOSIS — I059 Rheumatic mitral valve disease, unspecified: Secondary | ICD-10-CM | POA: Diagnosis not present

## 2019-09-04 DIAGNOSIS — G319 Degenerative disease of nervous system, unspecified: Secondary | ICD-10-CM | POA: Diagnosis not present

## 2019-09-16 DIAGNOSIS — Z23 Encounter for immunization: Secondary | ICD-10-CM | POA: Diagnosis not present

## 2019-10-06 DIAGNOSIS — Z8673 Personal history of transient ischemic attack (TIA), and cerebral infarction without residual deficits: Secondary | ICD-10-CM | POA: Diagnosis not present

## 2019-10-06 DIAGNOSIS — G319 Degenerative disease of nervous system, unspecified: Secondary | ICD-10-CM | POA: Diagnosis not present

## 2019-10-06 DIAGNOSIS — M545 Low back pain: Secondary | ICD-10-CM | POA: Diagnosis not present

## 2019-10-06 DIAGNOSIS — I059 Rheumatic mitral valve disease, unspecified: Secondary | ICD-10-CM | POA: Diagnosis not present

## 2019-10-14 DIAGNOSIS — Z23 Encounter for immunization: Secondary | ICD-10-CM | POA: Diagnosis not present

## 2019-11-03 DIAGNOSIS — I341 Nonrheumatic mitral (valve) prolapse: Secondary | ICD-10-CM | POA: Diagnosis not present

## 2019-11-03 DIAGNOSIS — I6990 Unspecified sequelae of unspecified cerebrovascular disease: Secondary | ICD-10-CM | POA: Diagnosis not present

## 2019-11-03 DIAGNOSIS — I10 Essential (primary) hypertension: Secondary | ICD-10-CM | POA: Diagnosis not present

## 2019-11-03 DIAGNOSIS — R413 Other amnesia: Secondary | ICD-10-CM | POA: Diagnosis not present

## 2019-11-25 DIAGNOSIS — R079 Chest pain, unspecified: Secondary | ICD-10-CM | POA: Diagnosis not present

## 2019-11-25 DIAGNOSIS — I10 Essential (primary) hypertension: Secondary | ICD-10-CM | POA: Diagnosis not present

## 2019-11-25 DIAGNOSIS — I351 Nonrheumatic aortic (valve) insufficiency: Secondary | ICD-10-CM | POA: Diagnosis not present

## 2019-11-25 DIAGNOSIS — E782 Mixed hyperlipidemia: Secondary | ICD-10-CM | POA: Diagnosis not present

## 2019-11-25 DIAGNOSIS — I2510 Atherosclerotic heart disease of native coronary artery without angina pectoris: Secondary | ICD-10-CM | POA: Diagnosis not present

## 2019-11-25 DIAGNOSIS — R609 Edema, unspecified: Secondary | ICD-10-CM | POA: Diagnosis not present

## 2019-12-01 DIAGNOSIS — R079 Chest pain, unspecified: Secondary | ICD-10-CM | POA: Diagnosis not present

## 2019-12-04 DIAGNOSIS — I341 Nonrheumatic mitral (valve) prolapse: Secondary | ICD-10-CM | POA: Diagnosis not present

## 2019-12-04 DIAGNOSIS — G4711 Idiopathic hypersomnia with long sleep time: Secondary | ICD-10-CM | POA: Diagnosis not present

## 2019-12-04 DIAGNOSIS — R413 Other amnesia: Secondary | ICD-10-CM | POA: Diagnosis not present

## 2019-12-09 DIAGNOSIS — R0602 Shortness of breath: Secondary | ICD-10-CM | POA: Diagnosis not present

## 2019-12-09 DIAGNOSIS — I10 Essential (primary) hypertension: Secondary | ICD-10-CM | POA: Diagnosis not present

## 2019-12-09 DIAGNOSIS — I6389 Other cerebral infarction: Secondary | ICD-10-CM | POA: Diagnosis not present

## 2019-12-09 DIAGNOSIS — I2510 Atherosclerotic heart disease of native coronary artery without angina pectoris: Secondary | ICD-10-CM | POA: Diagnosis not present

## 2019-12-16 DIAGNOSIS — I2510 Atherosclerotic heart disease of native coronary artery without angina pectoris: Secondary | ICD-10-CM | POA: Diagnosis not present

## 2019-12-16 DIAGNOSIS — R609 Edema, unspecified: Secondary | ICD-10-CM | POA: Diagnosis not present

## 2019-12-19 DIAGNOSIS — I2510 Atherosclerotic heart disease of native coronary artery without angina pectoris: Secondary | ICD-10-CM | POA: Diagnosis not present

## 2019-12-19 DIAGNOSIS — R0602 Shortness of breath: Secondary | ICD-10-CM | POA: Diagnosis not present

## 2019-12-19 DIAGNOSIS — E782 Mixed hyperlipidemia: Secondary | ICD-10-CM | POA: Diagnosis not present

## 2019-12-19 DIAGNOSIS — I10 Essential (primary) hypertension: Secondary | ICD-10-CM | POA: Diagnosis not present

## 2019-12-19 DIAGNOSIS — I6389 Other cerebral infarction: Secondary | ICD-10-CM | POA: Diagnosis not present

## 2019-12-22 ENCOUNTER — Encounter: Payer: Self-pay | Admitting: Internal Medicine

## 2019-12-22 ENCOUNTER — Encounter: Payer: Self-pay | Admitting: *Deleted

## 2019-12-22 ENCOUNTER — Other Ambulatory Visit: Payer: Self-pay

## 2019-12-22 ENCOUNTER — Ambulatory Visit (INDEPENDENT_AMBULATORY_CARE_PROVIDER_SITE_OTHER): Payer: Medicare Other | Admitting: Internal Medicine

## 2019-12-22 VITALS — BP 97/64 | HR 77 | Wt 166.0 lb

## 2019-12-22 DIAGNOSIS — M792 Neuralgia and neuritis, unspecified: Secondary | ICD-10-CM | POA: Diagnosis not present

## 2019-12-22 DIAGNOSIS — G20 Parkinson's disease: Secondary | ICD-10-CM | POA: Insufficient documentation

## 2019-12-22 DIAGNOSIS — I952 Hypotension due to drugs: Secondary | ICD-10-CM | POA: Insufficient documentation

## 2019-12-22 NOTE — Assessment & Plan Note (Signed)
Patient  Was advised  To get a carpet in bath room and hand rail in bathroom , use walker to walk

## 2019-12-22 NOTE — Assessment & Plan Note (Signed)
rtn 2 ks  For follow up . His pain is getting better

## 2019-12-22 NOTE — Progress Notes (Signed)
Established Patient Office Visit  Subjective:  Patient ID: Kyle Santos, male    DOB: 04/24/1936  Age: 84 y.o. MRN: [id number]  CC:  Chief Complaint  Patient presents with  . Herpes Zoster    patient complains of rash on left side at waist line x 2 weeks     HPI Kyle Santos presents with rash and pain for 2 weeks  lt back,he is known to have  Atrial fib and parkinsonian  Syndrome , denies fever chills , or sob/ his blood pressure  Is low  . Has ataxia,no h/o fall  Or  Urinary problem ,  Past Medical History:  Diagnosis Date  . A-fib ([place])   . Arthritis   . BPH (benign prostatic hyperplasia)   . Depression   . Heart murmur   . HOH (hard of hearing)   . Hypercholesteremia   . Hypertension   . Loss of equilibrium    being evaluated at this time.  . Pneumonia    history of  . Sleep apnea     Past Surgical History:  Procedure Laterality Date  . CATARACT EXTRACTION W/PHACO Right 01/09/2017   Procedure: CATARACT EXTRACTION PHACO AND INTRAOCULAR LENS PLACEMENT (IOC);  Surgeon: Birder Robson, MD;  Location: [facility] ORS;  Service: Ophthalmology;  Laterality: Right;  [place] 01:06 AP% 19.7 CDE 13.18 Fluid pack lot # [id number]  . CATARACT EXTRACTION W/PHACO Left 08/07/2017   Procedure: CATARACT EXTRACTION PHACO AND INTRAOCULAR LENS PLACEMENT (IOC)-LEFT;  Surgeon: Birder Robson, MD;  Location: [facility] ORS;  Service: Ophthalmology;  Laterality: Left;  [place]  00:43 AP% 15.0 CDE 6.50 Fluid pack lot # [id number]  . COLONOSCOPY    . JOINT REPLACEMENT     knee  . JOINT REPLACEMENT     shoulder    Family History  Problem Relation Age of Onset  . Heart disease Father     Social History   Socioeconomic History  . Marital status: Married    Spouse name: Not on file  . Number of children: Not on file  . Years of education: Not on file  . Highest education level: Not on file  Occupational History  . Not on file  Tobacco Use  . Smoking status: Former Smoker    Quit date:  08/22/1943    Years since quitting: 76.3  . Smokeless tobacco: Former Network engineer and Sexual Activity  . Alcohol use: Yes    Alcohol/week: 0.0 standard drinks    Comment: occasional  . Drug use: No  . Sexual activity: Not on file  Other Topics Concern  . Not on file  Social History Narrative   Drinks about 2 cups of caffeine.   Social Determinants of Health   Financial Resource Strain:   . Difficulty of Paying Living Expenses:   Food Insecurity:   . Worried About Charity fundraiser in the Last Year:   . Arboriculturist in the Last Year:   Transportation Needs:   . Film/video editor (Medical):   Marland Kitchen Lack of Transportation (Non-Medical):   Physical Activity:   . Days of Exercise per Week:   . Minutes of Exercise per Session:   Stress:   . Feeling of Stress :   Social Connections:   . Frequency of Communication with Friends and Family:   . Frequency of Social Gatherings with Friends and Family:   . Attends Religious Services:   . Active Member of Clubs or Organizations:   . Attends  Club or Organization Meetings:   Marland Kitchen Marital Status:   Intimate Partner Violence:   . Fear of Current or Ex-Partner:   . Emotionally Abused:   Marland Kitchen Physically Abused:   . Sexually Abused:     Outpatient Medications Prior to Visit  Medication Sig Dispense Refill  . aspirin EC 81 MG tablet Take 81 mg by mouth daily.    Marland Kitchen atorvastatin (LIPITOR) 80 MG tablet Take 80 mg by mouth daily.     . B Complex Vitamins (B COMPLEX-B12 PO) Take 1 tablet by mouth daily.    Marland Kitchen buPROPion (WELLBUTRIN SR) 200 MG 12 hr tablet Take 200 mg by mouth 2 (two) times daily.    Rolena Infante [place] 450 MG CAPS Take 450 mg by mouth 2 (two) times daily.    . Cholecalciferol (VITAMIN D3) 1000 units CAPS Take 1,000 Units by mouth daily at 3 pm.    . enalapril (VASOTEC) 5 MG tablet Take 5 mg by mouth at bedtime.     Marland Kitchen ibuprofen (ADVIL,MOTRIN) 200 MG tablet Take 400 mg by mouth every 6 (six) hours as needed for headache or  moderate pain.    . Melatonin 5 MG TABS Take 5 mg by mouth at bedtime.     . tamsulosin (FLOMAX) 0.4 MG CAPS capsule Take 0.4 mg by mouth daily.     . traZODone (DESYREL) 50 MG tablet Take 50 mg by mouth at bedtime.     Marland Kitchen warfarin (COUMADIN) 7.5 MG tablet Take 7.5 mg by mouth daily in the morning    . lisinopril-hydrochlorothiazide (ZESTORETIC) 20-12.5 MG tablet Take 1 tablet by mouth daily.     No facility-administered medications prior to visit.    No Known Allergies  ROS Review of Systems  Constitutional: Negative for chills and fever.  HENT: Negative for nosebleeds and sore throat.   Eyes: Negative for pain.  Respiratory: Negative for shortness of breath and wheezing.   Cardiovascular: Negative for chest pain.  Gastrointestinal: Negative for abdominal distention and abdominal pain.  Genitourinary: Negative for dysuria.  Musculoskeletal: Positive for back pain (lt lumber pain).  Skin: Negative for rash.  Neurological: Positive for weakness and light-headedness. Negative for dizziness, syncope and speech difficulty.  Psychiatric/Behavioral:       Not depressed      Objective:    Physical Exam  Constitutional: He is oriented to person, place, and time. He appears well-developed and well-nourished. No distress.  HENT:  Head: Normocephalic and atraumatic.  Neck: No JVD present. No tracheal deviation present. Thyromegaly present.  Cardiovascular: Normal rate.  No murmur heard. Irregular rythm  Pulmonary/Chest: No respiratory distress. He has no wheezes. He has no rales. He exhibits no tenderness.  Abdominal: He exhibits no distension and no mass. There is no abdominal tenderness. There is no rebound and no guarding.  Musculoskeletal:        General: No tenderness or edema.     Cervical back: Neck supple.  Lymphadenopathy:    He has no cervical adenopathy.  Neurological: He is alert and oriented to person, place, and time. Coordination abnormal.  Skin: Skin is warm. No  rash noted. No erythema.  Psychiatric: He has a normal mood and affect. His behavior is normal. Judgment normal.    BP 97/64   Pulse 77   Wt 166 lb (75.3 kg)   BMI 3241.97 kg/m  Wt Readings from Last 3 Encounters:  12/22/19 166 lb (75.3 kg)  11/03/19 171 lb (77.6 kg)  07/02/19 160 lb (  72.6 kg)     Health Maintenance Due  Topic Date Due  . COVID-19 Vaccine (1) Never done  . TETANUS/TDAP  Never done  . PNA vac Low Risk Adult (1 of 2 - PCV13) Never done    There are no preventive care reminders to display for this patient.  No results found for: TSH Lab Results  Component Value Date   WBC 8.5 07/02/2019   HGB 14.5 07/02/2019   HCT 42.2 07/02/2019   MCV 93.2 07/02/2019   PLT 163 07/02/2019   Lab Results  Component Value Date   NA 141 07/02/2019   K 3.9 07/02/2019   CO2 27 07/02/2019   GLUCOSE 94 07/02/2019   BUN 20 07/02/2019   CREATININE 1.25 (H) 07/02/2019   CALCIUM 9.2 07/02/2019   ANIONGAP 9 07/02/2019   No results found for: CHOL No results found for: HDL No results found for: LDLCALC No results found for: TRIG No results found for: CHOLHDL No results found for: HGBA1C    Assessment & Plan:   Problem List Items Addressed This Visit      Cardiovascular and Mediastinum   Hypotension due to drugs    Lisinopril stopped and trazodone reduced  To 25 mg po daily        Nervous and Auditory   Primary Parkinsonism ([place])    Patient  Was advised  To get a carpet in bath room and hand rail in bathroom , use walker to walk        Other   Neuralgia - Primary    rtn 2 ks  For follow up . His pain is getting better         No orders of the defined types were placed in this encounter.   Follow-up: Return in about 2 weeks (around 01/05/2020) for follow  up.    Cletis Athens, MD

## 2019-12-22 NOTE — Assessment & Plan Note (Signed)
Lisinopril stopped and trazodone reduced  To 25 mg po daily

## 2019-12-25 DIAGNOSIS — I2510 Atherosclerotic heart disease of native coronary artery without angina pectoris: Secondary | ICD-10-CM | POA: Diagnosis not present

## 2019-12-25 DIAGNOSIS — E782 Mixed hyperlipidemia: Secondary | ICD-10-CM | POA: Diagnosis not present

## 2019-12-25 DIAGNOSIS — I10 Essential (primary) hypertension: Secondary | ICD-10-CM | POA: Diagnosis not present

## 2019-12-25 DIAGNOSIS — I351 Nonrheumatic aortic (valve) insufficiency: Secondary | ICD-10-CM | POA: Diagnosis not present

## 2019-12-25 DIAGNOSIS — I340 Nonrheumatic mitral (valve) insufficiency: Secondary | ICD-10-CM | POA: Diagnosis not present

## 2019-12-30 DIAGNOSIS — E782 Mixed hyperlipidemia: Secondary | ICD-10-CM | POA: Diagnosis not present

## 2019-12-30 DIAGNOSIS — I10 Essential (primary) hypertension: Secondary | ICD-10-CM | POA: Diagnosis not present

## 2019-12-30 DIAGNOSIS — I340 Nonrheumatic mitral (valve) insufficiency: Secondary | ICD-10-CM | POA: Diagnosis not present

## 2019-12-30 DIAGNOSIS — I351 Nonrheumatic aortic (valve) insufficiency: Secondary | ICD-10-CM | POA: Diagnosis not present

## 2019-12-30 DIAGNOSIS — I2510 Atherosclerotic heart disease of native coronary artery without angina pectoris: Secondary | ICD-10-CM | POA: Diagnosis not present

## 2020-01-05 ENCOUNTER — Ambulatory Visit (INDEPENDENT_AMBULATORY_CARE_PROVIDER_SITE_OTHER): Payer: Medicare Other | Admitting: Internal Medicine

## 2020-01-05 ENCOUNTER — Other Ambulatory Visit: Payer: Self-pay

## 2020-01-05 ENCOUNTER — Encounter: Payer: Self-pay | Admitting: Internal Medicine

## 2020-01-05 VITALS — BP 122/74 | HR 82 | Wt 167.6 lb

## 2020-01-05 DIAGNOSIS — G20 Parkinson's disease: Secondary | ICD-10-CM

## 2020-01-05 DIAGNOSIS — I4891 Unspecified atrial fibrillation: Secondary | ICD-10-CM | POA: Diagnosis not present

## 2020-01-05 DIAGNOSIS — B029 Zoster without complications: Secondary | ICD-10-CM

## 2020-01-05 LAB — POCT INR: INR: 1.6 — AB (ref 2.0–3.0)

## 2020-01-05 NOTE — Assessment & Plan Note (Signed)
stable °

## 2020-01-05 NOTE — Progress Notes (Addendum)
Established Patient Office Visit  Subjective:  Patient ID: Kyle Santos, male    DOB: [birth year]  Age: 84 y.o. MRN: [id number]  CC:  Chief Complaint  Patient presents with  . Atrial Fibrillation    patient here for 1 month PT/INR   . Herpes Zoster    10 day fu     HPI  Kyle Santos for evaluation of his atrial fibrillation as well as herpes zoster 10 days follow-up.  Past Medical History:  Diagnosis Date  . A-fib ([place])   . Arthritis   . BPH (benign prostatic hyperplasia)   . Depression   . Heart murmur   . HOH (hard of hearing)   . Hypercholesteremia   . Hypertension   . Loss of equilibrium    being evaluated at this time.  . Pneumonia    history of  . Sleep apnea     Past Surgical History:  Procedure Laterality Date  . CATARACT EXTRACTION W/PHACO Right 01/09/2017   Procedure: CATARACT EXTRACTION PHACO AND INTRAOCULAR LENS PLACEMENT (IOC);  Surgeon: Birder Robson, MD;  Location: [facility] ORS;  Service: Ophthalmology;  Laterality: Right;  [place] 01:06 AP% 19.7 CDE 13.18 Fluid pack lot # [id number]  . CATARACT EXTRACTION W/PHACO Left 08/07/2017   Procedure: CATARACT EXTRACTION PHACO AND INTRAOCULAR LENS PLACEMENT (IOC)-LEFT;  Surgeon: Birder Robson, MD;  Location: [facility] OR[facility];  Service: Ophthalmology;  Laterality: Left;  [place]  00:43 AP% 15.0 CDE 6.50 Fluid pack lot # [id number]  . COLONOSCOPY    . JOINT REPLACEMENT     knee  . JOINT REPLACEMENT     shoulder    Family History  Problem Relation Age of Onset  . Heart disease Father     Social History   Socioeconomic History  . Marital status: Married    Spouse name: Not on file  . Number of children: Not on file  . Years of education: Not on file  . Highest education level: Not on file  Occupational History  . Not on file  Tobacco Use  . Smoking status: Former Smoker    Quit date: 08/22/1943    Years since quitting: 76.4  . Smokeless tobacco: Former Network engineer and Sexual Activity  . Alcohol use:  Yes    Alcohol/week: 0.0 standard drinks    Comment: occasional  . Drug use: No  . Sexual activity: Not on file  Other Topics Concern  . Not on file  Social History Narrative   Drinks about 2 cups of caffeine.   Social Determinants of Health   Financial Resource Strain:   . Difficulty of Paying Living Expenses:   Food Insecurity:   . Worried About Charity fundraiser in the Last Year:   . Arboriculturist in the Last Year:   Transportation Needs:   . Film/video editor (Medical):   Marland Kitchen Lack of Transportation (Non-Medical):   Physical Activity:   . Days of Exercise per Week:   . Minutes of Exercise per Session:   Stress:   . Feeling of Stress :   Social Connections:   . Frequency of Communication with Friends and Family:   . Frequency of Social Gatherings with Friends and Family:   . Attends Religious Services:   . Active Member of Clubs or Organizations:   . Attends Archivist Meetings:   Marland Kitchen Marital Status:   Intimate Partner Violence:   . Fear of Current or Ex-Partner:   . Emotionally Abused:   .  Physically Abused:   . Sexually Abused:      Current Outpatient Medications:  .  atorvastatin (LIPITOR) 80 MG tablet, Take 80 mg by mouth daily. , Disp: , Rfl:  .  B Complex Vitamins (B COMPLEX-B12 PO), Take 1 tablet by mouth daily., Disp: , Rfl:  .  Cascara [place] 450 MG CAPS, Take 450 mg by mouth 2 (two) times daily., Disp: , Rfl:  .  Cholecalciferol (VITAMIN D3) 1000 units CAPS, Take 1,000 Units by mouth daily at 3 pm., Disp: , Rfl:  .  enalapril (VASOTEC) 5 MG tablet, Take 5 mg by mouth at bedtime. , Disp: , Rfl:  .  ibuprofen (ADVIL,MOTRIN) 200 MG tablet, Take 400 mg by mouth every 6 (six) hours as needed for headache or moderate pain., Disp: , Rfl:  .  Melatonin 5 MG TABS, Take 5 mg by mouth at bedtime. , Disp: , Rfl:  .  tamsulosin (FLOMAX) 0.4 MG CAPS capsule, Take 0.4 mg by mouth daily. , Disp: , Rfl:  .  traZODone (DESYREL) 50 MG tablet, Take 25 mg by  mouth at bedtime., Disp: , Rfl:  .  warfarin (COUMADIN) 7.5 MG tablet, Take 7.5 mg by mouth daily in the morning, Disp: , Rfl:    No Known Allergies  ROS Review of Systems  Constitutional: Negative for chills.  HENT: Negative for ear pain and postnasal drip.   Eyes: Negative for pain.  Respiratory: Negative for chest tightness.   Cardiovascular: Negative for chest pain.  Gastrointestinal: Negative for constipation.  Endocrine: Negative for polydipsia.  Genitourinary: Negative for frequency.  Musculoskeletal: Positive for back pain.      Objective:   HEENT examination is normal neck is supple jugular venous pressure is not elevated chest is clear heart is regular abdomen is soft nontender patient complained of some back pain when getting up there is no pedal edema no calf tenderness he has some parkinsonian tremors      BP 122/74   Pulse 82   Wt 167 lb 9.6 oz (76 kg)   BMI 3273.21 kg/m  Wt Readings from Last 3 Encounters:  01/05/20 167 lb 9.6 oz (76 kg)  12/22/19 166 lb (75.3 kg)  11/03/19 171 lb (77.6 kg)     Health Maintenance Due  Topic Date Due  . TETANUS/TDAP  Never done  . PNA vac Low Risk Adult (1 of 2 - PCV13) Never done    There are no preventive care reminders to display for this patient.  No results found for: TSH Lab Results  Component Value Date   WBC 8.5 07/02/2019   HGB 14.5 07/02/2019   HCT 42.2 07/02/2019   MCV 93.2 07/02/2019   PLT 163 07/02/2019   Lab Results  Component Value Date   NA 141 07/02/2019   K 3.9 07/02/2019   CO2 27 07/02/2019   GLUCOSE 94 07/02/2019   BUN 20 07/02/2019   CREATININE 1.25 (H) 07/02/2019   CALCIUM 9.2 07/02/2019   ANIONGAP 9 07/02/2019   No results found for: CHOL No results found for: HDL No results found for: LDLCALC No results found for: TRIG No results found for: CHOLHDL No results found for: HGBA1C    Assessment & Plan:   Problem List Items Addressed This Visit      Cardiovascular and  Mediastinum   Atrial fibrillation ([place]) - Primary    stable      Relevant Orders   POCT INR (Completed)     Nervous and Auditory  Primary Parkinsonism ([place])    Patient pro time is borderline subtherapeutic because he stopped the Coumadin before he has back shots  he was advised to continue taking the same dose of Coumadin  his back is somewhat better  he will return back in 4 weeks for follow-up Herpes zoster without complication Resolved   No orders of the defined types were placed in this encounter.         Follow-up: No follow-ups on file.    Cletis Athens, MD

## 2020-02-05 ENCOUNTER — Ambulatory Visit: Payer: Medicare Other | Admitting: Internal Medicine

## 2020-02-12 ENCOUNTER — Encounter: Payer: Self-pay | Admitting: Internal Medicine

## 2020-02-12 ENCOUNTER — Ambulatory Visit (INDEPENDENT_AMBULATORY_CARE_PROVIDER_SITE_OTHER): Payer: Medicare Other | Admitting: Internal Medicine

## 2020-02-12 ENCOUNTER — Other Ambulatory Visit: Payer: Self-pay

## 2020-02-12 VITALS — BP 137/84 | HR 65 | Ht 67.0 in | Wt 169.0 lb

## 2020-02-12 DIAGNOSIS — I10 Essential (primary) hypertension: Secondary | ICD-10-CM | POA: Diagnosis not present

## 2020-02-12 DIAGNOSIS — I4891 Unspecified atrial fibrillation: Secondary | ICD-10-CM | POA: Diagnosis not present

## 2020-02-12 DIAGNOSIS — M792 Neuralgia and neuritis, unspecified: Secondary | ICD-10-CM | POA: Diagnosis not present

## 2020-02-12 DIAGNOSIS — K029 Dental caries, unspecified: Secondary | ICD-10-CM | POA: Diagnosis not present

## 2020-02-12 DIAGNOSIS — G2581 Restless legs syndrome: Secondary | ICD-10-CM | POA: Diagnosis not present

## 2020-02-12 LAB — POCT INR: INR: 2.5 (ref 2.0–3.0)

## 2020-02-12 MED ORDER — ENALAPRIL MALEATE 5 MG PO TABS: 5.0000 mg | ORAL_TABLET | Freq: Every day | ORAL | 2 refills | Status: DC

## 2020-02-12 MED ORDER — AMPICILLIN 500 MG PO CAPS: 500.0000 mg | ORAL_CAPSULE | Freq: Four times a day (QID) | ORAL | 0 refills | Status: DC

## 2020-02-12 NOTE — Progress Notes (Signed)
Established Patient Office Visit  SUBJECTIVE:  Subjective  Patient ID: Kyle Santos, male    DOB: 1936-07-09  Age: 84 y.o. MRN: [id number]  CC:  Chief Complaint  Patient presents with  . Coumadin Management    Pt presents today for a 1 month follow up for his INR check    HPI Kyle Santos is a 84 y.o. male presenting today for INR follow-up.  Today he reports feeling well. He reports trouble with sleeping due to arthritis in his feet. He also reports that his BP upon awakening this morning was 188/93, though he has been out of enalapril for over a week.   He is being followed by a neurologist, Dr. Berneta Levins, for his Parkinsonian-like tremor. He still has some residual restlessness in his legs, though it is decreasing. The trazodone and melatonin help him sleep despite the restlessness.  He will have dental extraction bilaterally on 02/24/2020 and is requesting antibiotics because of his prosthetic shoulder; he has taken amoxicillin before and tolerated it well. He is a former smoker, having smoked for about 4 years. He used to experience sleep apnea, but does not use CPAP any more and denies having any sleep apnea.  Past Medical History:  Diagnosis Date  . A-fib ([place])   . Arthritis   . BPH (benign prostatic hyperplasia)   . Depression   . Heart murmur   . HOH (hard of hearing)   . Hypercholesteremia   . Hypertension   . Loss of equilibrium    being evaluated at this time.  . Pneumonia    history of  . Sleep apnea     Past Surgical History:  Procedure Laterality Date  . CATARACT EXTRACTION W/PHACO Right 01/09/2017   Procedure: CATARACT EXTRACTION PHACO AND INTRAOCULAR LENS PLACEMENT (IOC);  Surgeon: Birder Robson, MD;  Location: [facility];  Service: Ophthalmology;  Laterality: Right;  [place] 01:06 AP% 19.7 CDE 13.18 Fluid pack lot # [id number]  . CATARACT EXTRACTION W/PHACO Left 08/07/2017   Procedure: CATARACT EXTRACTION PHACO AND INTRAOCULAR LENS PLACEMENT  (IOC)-LEFT;  Surgeon: Birder Robson, MD;  Location: [facility];  Service: Ophthalmology;  Laterality: Left;  [place]  00:43 AP% 15.0 CDE 6.50 Fluid pack lot # [id number]  . COLONOSCOPY    . JOINT REPLACEMENT     knee  . JOINT REPLACEMENT     shoulder    Family History  Problem Relation Age of Onset  . Heart disease Father     Social History   Socioeconomic History  . Marital status: Married    Spouse name: Not on file  . Number of children: Not on file  . Years of education: Not on file  . Highest education level: Not on file  Occupational History  . Not on file  Tobacco Use  . Smoking status: Former Smoker    Quit date: 08/22/1943    Years since quitting: 76.5  . Smokeless tobacco: Former Network engineer and Sexual Activity  . Alcohol use: Yes    Alcohol/week: 0.0 standard drinks    Comment: occasional  . Drug use: No  . Sexual activity: Not on file  Other Topics Concern  . Not on file  Social History Narrative   Drinks about 2 cups of caffeine.   Social Determinants of Health   Financial Resource Strain:   . Difficulty of Paying Living Expenses:   Food Insecurity:   . Worried About Charity fundraiser in the Last Year:   . Ran  Out of Food in the Last Year:   Transportation Needs:   . Lack of Transportation (Medical):   Marland Kitchen Lack of Transportation (Non-Medical):   Physical Activity:   . Days of Exercise per Week:   . Minutes of Exercise per Session:   Stress:   . Feeling of Stress :   Social Connections:   . Frequency of Communication with Friends and Family:   . Frequency of Social Gatherings with Friends and Family:   . Attends Religious Services:   . Active Member of Clubs or Organizations:   . Attends Archivist Meetings:   Marland Kitchen Marital Status:   Intimate Partner Violence:   . Fear of Current or Ex-Partner:   . Emotionally Abused:   Marland Kitchen Physically Abused:   . Sexually Abused:      Current Outpatient Medications:  .  ampicillin (PRINCIPEN) 500  MG capsule, Take 1 capsule (500 mg total) by mouth 4 (four) times daily., Disp: 8 capsule, Rfl: 0 .  atorvastatin (LIPITOR) 80 MG tablet, Take 80 mg by mouth daily. , Disp: , Rfl:  .  B Complex Vitamins (B COMPLEX-B12 PO), Take 1 tablet by mouth daily., Disp: , Rfl:  .  Cascara [place] 450 MG CAPS, Take 450 mg by mouth 2 (two) times daily., Disp: , Rfl:  .  Cholecalciferol (VITAMIN D3) 1000 units CAPS, Take 1,000 Units by mouth daily at 3 pm., Disp: , Rfl:  .  enalapril (VASOTEC) 5 MG tablet, Take 1 tablet (5 mg total) by mouth at bedtime., Disp: 9030 tablet, Rfl: 2 .  ibuprofen (ADVIL,MOTRIN) 200 MG tablet, Take 400 mg by mouth every 6 (six) hours as needed for headache or moderate pain., Disp: , Rfl:  .  Melatonin 5 MG TABS, Take 5 mg by mouth at bedtime. , Disp: , Rfl:  .  tamsulosin (FLOMAX) 0.4 MG CAPS capsule, Take 0.4 mg by mouth daily. , Disp: , Rfl:  .  traZODone (DESYREL) 50 MG tablet, Take 25 mg by mouth at bedtime., Disp: , Rfl:  .  warfarin (COUMADIN) 7.5 MG tablet, Take 7.5 mg by mouth daily in the morning, Disp: , Rfl:    No Known Allergies  ROS Review of Systems  Constitutional: Negative.   HENT: Negative.   Eyes: Negative.   Respiratory: Negative.  Negative for apnea.   Cardiovascular: Negative.  Negative for leg swelling.  Gastrointestinal: Negative.   Endocrine: Negative.   Genitourinary: Negative.   Musculoskeletal: Negative.   Skin: Negative.   Allergic/Immunologic: Negative.   Neurological: Positive for tremors (Parkinsonian-like tremor).  Hematological: Negative.   Psychiatric/Behavioral: Negative.   All other systems reviewed and are negative.    OBJECTIVE:    Physical Exam Vitals reviewed.  Constitutional:      Appearance: Normal appearance.  Neck:     Vascular: No carotid bruit.  Cardiovascular:     Rate and Rhythm: Normal rate. Rhythm irregular.     Pulses:          Dorsalis pedis pulses are 1+ on the right side.       Posterior tibial pulses  are 1+ on the right side.     Heart sounds: Normal heart sounds.  Pulmonary:     Effort: Pulmonary effort is normal.     Breath sounds: Normal breath sounds.  Abdominal:     General: Bowel sounds are normal.     Palpations: Abdomen is soft. There is no hepatomegaly or splenomegaly.     Tenderness: There is  no abdominal tenderness.     Hernia: No hernia is present.  Musculoskeletal:     Right lower leg: No edema.     Left lower leg: No edema.  Skin:    Findings: No rash.  Neurological:     General: No focal deficit present.     Mental Status: He is alert and oriented to person, place, and time.  Psychiatric:        Mood and Affect: Mood normal.        Behavior: Behavior normal.     BP 137/84   Pulse 65   Ht 5\' 7"  (1.702 m)   Wt 169 lb (76.7 kg)   BMI 26.47 kg/m  Wt Readings from Last 3 Encounters:  02/12/20 169 lb (76.7 kg)  01/05/20 167 lb 9.6 oz (76 kg)  12/22/19 166 lb (75.3 kg)    Health Maintenance Due  Topic Date Due  . TETANUS/TDAP  Never done  . PNA vac Low Risk Adult (1 of 2 - PCV13) Never done    There are no preventive care reminders to display for this patient.  CBC Latest Ref Rng & Units 07/02/2019 06/18/2017 10/05/2011  WBC 4.0 - 10.5 K/uL 8.5 7.1 5.8  Hemoglobin 13.0 - 17.0 g/dL 14.5 13.6 14.3  Hematocrit 39 - 52 % 42.2 39.9(L) 42.2  Platelets 150 - 400 K/uL 163 156 171   CMP Latest Ref Rng & Units 07/02/2019 06/18/2017 02/12/2017  Glucose 70 - 99 mg/dL 94 140(H) -  BUN 8 - 23 mg/dL 20 27(H) -  Creatinine 0.61 - 1.24 mg/dL 1.25(H) 1.61(H) 1.30(H)  Sodium 135 - 145 mmol/L 141 138 -  Potassium 3.5 - 5.1 mmol/L 3.9 4.1 -  Chloride 98 - 111 mmol/L 105 106 -  CO2 22 - 32 mmol/L 27 24 -  Calcium 8.9 - 10.3 mg/dL 9.2 9.1 -    No results found for: TSH Lab Results  Component Value Date   ANIONGAP 9 07/02/2019   Recent Results (from the past 2160 hour(s))  POCT INR     Status: Abnormal   Collection Time: 01/05/20 11:08 AM  Result Value Ref Range    INR 1.6 (A) 2.0 - 3.0    Comment: PT 19.0  POCT INR     Status: None   Collection Time: 02/12/20  2:12 PM  Result Value Ref Range   INR 2.5 2.0 - 3.0    Comment: PT 28.6   No results found for: CHOL, HDL, LDLCALC, CHOLHDL No results found for: TRIG No results found for: HGBA1C    ASSESSMENT & PLAN:   Problem List Items Addressed This Visit      Cardiovascular and Mediastinum   Atrial fibrillation ([place])    At fib is stable  , protime  Is therapeutic      Relevant Medications   enalapril (VASOTEC) 5 MG tablet   Other Relevant Orders   POCT INR (Completed)   Essential hypertension - Primary    Bp  Is stable , vasotec  Renewed  - Today, the patient's blood pressure is well managed on vasotec. - The patient will continue the current treatment regimen.  - I encouraged the patient to eat a low-sodium diet to help control blood pressure. - I encouraged the patient to live an active lifestyle and complete activities that increases heart rate to 85% target heart rate at least 5 times per week for one hour.          Relevant Medications  enalapril (VASOTEC) 5 MG tablet     Digestive   Tooth decay    Ref to dentist      Relevant Medications   ampicillin (PRINCIPEN) 500 MG capsule     Other   Restless legs syndrome (RLS)    Stable  Now , med don't work      Neuralgia    Patient  Hurt mostly  At night         Meds ordered this encounter  Medications  . ampicillin (PRINCIPEN) 500 MG capsule    Sig: Take 1 capsule (500 mg total) by mouth 4 (four) times daily.    Dispense:  8 capsule    Refill:  0  . enalapril (VASOTEC) 5 MG tablet    Sig: Take 1 tablet (5 mg total) by mouth at bedtime.    Dispense:  9030 tablet    Refill:  2   Essential hypertension - Plan: enalapril (VASOTEC) 5 MG tablet  Atrial fibrillation, unspecified type ([place]) - Plan: POCT INR  Tooth decay - Plan: ampicillin (PRINCIPEN) 500 MG capsule  Restless legs syndrome  (RLS)  Neuralgia    Follow-up: Return in about 4 weeks (around 03/11/2020).    Dr. Jane Canary [facility] [address], [place], [place] [address]   By signing my name below, I, Milinda Antis, attest that this documentation has been prepared under the direction and in the presence of Cletis Athens, MD. Electronically Signed: Cletis Athens, MD 02/12/20, 2:49 PM   I personally performed the services described in this documentation, which was SCRIBED in my presence. The recorded information has been reviewed and considered accurate. It has been edited as necessary during review. Cletis Athens, MD

## 2020-02-12 NOTE — Assessment & Plan Note (Signed)
Bp  Is stable , vasotec  Renewed  - Today, the patient's blood pressure is well managed on vasotec. - The patient will continue the current treatment regimen.  - I encouraged the patient to eat a low-sodium diet to help control blood pressure. - I encouraged the patient to live an active lifestyle and complete activities that increases heart rate to 85% target heart rate at least 5 times per week for one hour.

## 2020-02-12 NOTE — Assessment & Plan Note (Signed)
Ref to dentist

## 2020-02-12 NOTE — Assessment & Plan Note (Signed)
Stable  Now , med don't work

## 2020-02-12 NOTE — Assessment & Plan Note (Signed)
Patient  Hurt mostly  At night

## 2020-02-12 NOTE — Assessment & Plan Note (Signed)
At fib is stable  , protime  Is therapeutic

## 2020-03-04 DIAGNOSIS — I4891 Unspecified atrial fibrillation: Secondary | ICD-10-CM | POA: Diagnosis not present

## 2020-03-04 DIAGNOSIS — I10 Essential (primary) hypertension: Secondary | ICD-10-CM | POA: Diagnosis not present

## 2020-03-04 DIAGNOSIS — I2510 Atherosclerotic heart disease of native coronary artery without angina pectoris: Secondary | ICD-10-CM | POA: Diagnosis not present

## 2020-03-04 DIAGNOSIS — E782 Mixed hyperlipidemia: Secondary | ICD-10-CM | POA: Diagnosis not present

## 2020-03-15 ENCOUNTER — Ambulatory Visit: Payer: Medicare Other | Admitting: Internal Medicine

## 2020-03-19 ENCOUNTER — Encounter: Payer: Self-pay | Admitting: Internal Medicine

## 2020-03-19 ENCOUNTER — Other Ambulatory Visit: Payer: Self-pay

## 2020-03-19 ENCOUNTER — Ambulatory Visit (INDEPENDENT_AMBULATORY_CARE_PROVIDER_SITE_OTHER): Payer: Medicare Other | Admitting: Internal Medicine

## 2020-03-19 VITALS — BP 126/78 | HR 68 | Ht 67.0 in | Wt 167.0 lb

## 2020-03-19 DIAGNOSIS — G20 Parkinson's disease: Secondary | ICD-10-CM | POA: Diagnosis not present

## 2020-03-19 DIAGNOSIS — I4891 Unspecified atrial fibrillation: Secondary | ICD-10-CM

## 2020-03-19 DIAGNOSIS — G2581 Restless legs syndrome: Secondary | ICD-10-CM | POA: Diagnosis not present

## 2020-03-19 DIAGNOSIS — R252 Cramp and spasm: Secondary | ICD-10-CM | POA: Diagnosis not present

## 2020-03-19 LAB — POCT INR: INR: 2.4 (ref 2.0–3.0)

## 2020-03-19 NOTE — Assessment & Plan Note (Signed)
Pt complainied of pain in the right leg. He was found to be tender in the hamstring medial head. No evidence of DVT. I told him to reduce lipitor to 40mg  daily, use massage to right lower thigh and use mustard as needed or vinegar with water to get rid of cramps. Return as needed

## 2020-03-19 NOTE — Progress Notes (Signed)
Established Patient Office Visit  SUBJECTIVE:  Subjective  Patient ID: Kyle Santos, male    DOB: [birth year]  Age: 84 y.o. MRN: [id number]  CC:  Chief Complaint  Patient presents with  . Atrial Fibrillation    1 month INR check    HPI Kyle Santos is a 84 y.o. male presenting today for a check-up. He has been getting leg cramps in the calves at night. Pt stretches it out until it goes away. There is tightness in the hamstrings. Pt has had his COVID19 vaccines.   Past Medical History:  Diagnosis Date  . A-fib ([place])   . Arthritis   . BPH (benign prostatic hyperplasia)   . Depression   . Heart murmur   . HOH (hard of hearing)   . Hypercholesteremia   . Hypertension   . Loss of equilibrium    being evaluated at this time.  . Pneumonia    history of  . Sleep apnea     Past Surgical History:  Procedure Laterality Date  . CATARACT EXTRACTION W/PHACO Right 01/09/2017   Procedure: CATARACT EXTRACTION PHACO AND INTRAOCULAR LENS PLACEMENT (IOC);  Surgeon: Birder Robson, MD;  Location: [facility] ORS;  Service: Ophthalmology;  Laterality: Right;  [place] 01:06 AP% 19.7 CDE 13.18 Fluid pack lot # [id number]  . CATARACT EXTRACTION W/PHACO Left 08/07/2017   Procedure: CATARACT EXTRACTION PHACO AND INTRAOCULAR LENS PLACEMENT (IOC)-LEFT;  Surgeon: Birder Robson, MD;  Location: [facility] ORS;  Service: Ophthalmology;  Laterality: Left;  [place]  00:43 AP% 15.0 CDE 6.50 Fluid pack lot # [id number]  . COLONOSCOPY    . JOINT REPLACEMENT     knee  . JOINT REPLACEMENT     shoulder    Family History  Problem Relation Age of Onset  . Heart disease Father     Social History   Socioeconomic History  . Marital status: Married    Spouse name: Not on file  . Number of children: Not on file  . Years of education: Not on file  . Highest education level: Not on file  Occupational History  . Not on file  Tobacco Use  . Smoking status: Former Smoker    Quit date: 08/22/1943    Years since  quitting: 76.6  . Smokeless tobacco: Former Network engineer and Sexual Activity  . Alcohol use: Yes    Alcohol/week: 0.0 standard drinks    Comment: occasional  . Drug use: No  . Sexual activity: Not on file  Other Topics Concern  . Not on file  Social History Narrative   Drinks about 2 cups of caffeine.   Social Determinants of Health   Financial Resource Strain:   . Difficulty of Paying Living Expenses:   Food Insecurity:   . Worried About Charity fundraiser in the Last Year:   . Arboriculturist in the Last Year:   Transportation Needs:   . Film/video editor (Medical):   Marland Kitchen Lack of Transportation (Non-Medical):   Physical Activity:   . Days of Exercise per Week:   . Minutes of Exercise per Session:   Stress:   . Feeling of Stress :   Social Connections:   . Frequency of Communication with Friends and Family:   . Frequency of Social Gatherings with Friends and Family:   . Attends Religious Services:   . Active Member of Clubs or Organizations:   . Attends Archivist Meetings:   Marland Kitchen Marital Status:   Intimate Production manager  Violence:   . Fear of Current or Ex-Partner:   . Emotionally Abused:   Marland Kitchen Physically Abused:   . Sexually Abused:      Current Outpatient Medications:  .  ampicillin (PRINCIPEN) 500 MG capsule, Take 1 capsule (500 mg total) by mouth 4 (four) times daily., Disp: 8 capsule, Rfl: 0 .  atorvastatin (LIPITOR) 80 MG tablet, Take 80 mg by mouth daily. , Disp: , Rfl:  .  B Complex Vitamins (B COMPLEX-B12 PO), Take 1 tablet by mouth daily., Disp: , Rfl:  .  Cascara [place]rada 450 MG CAPS, Take 450 mg by mouth 2 (two) times daily., Disp: , Rfl:  .  Cholecalciferol (VITAMIN D3) 1000 units CAPS, Take 1,000 Units by mouth daily at 3 pm., Disp: , Rfl:  .  enalapril (VASOTEC) 5 MG tablet, Take 1 tablet (5 mg total) by mouth at bedtime., Disp: 9030 tablet, Rfl: 2 .  ibuprofen (ADVIL,MOTRIN) 200 MG tablet, Take 400 mg by mouth every 6 (six) hours as needed for  headache or moderate pain., Disp: , Rfl:  .  Melatonin 5 MG TABS, Take 5 mg by mouth at bedtime. , Disp: , Rfl:  .  tamsulosin (FLOMAX) 0.4 MG CAPS capsule, Take 0.4 mg by mouth daily. , Disp: , Rfl:  .  traZODone (DESYREL) 50 MG tablet, Take 25 mg by mouth at bedtime., Disp: , Rfl:  .  warfarin (COUMADIN) 7.5 MG tablet, Take 7.5 mg by mouth daily in the morning, Disp: , Rfl:    No Known Allergies  ROS Review of Systems  Constitutional: Negative.   HENT: Negative.   Eyes: Negative.   Respiratory: Negative.   Cardiovascular: Negative.   Gastrointestinal: Negative.   Endocrine: Negative.   Genitourinary: Negative.   Musculoskeletal: Negative.        Lower leg cramping   Skin: Negative.   Allergic/Immunologic: Negative.   Neurological: Negative.   Hematological: Negative.   Psychiatric/Behavioral: Negative.   All other systems reviewed and are negative.    OBJECTIVE:    Physical Exam Vitals reviewed.  Constitutional:      Appearance: Normal appearance.  HENT:     Mouth/Throat:     Mouth: Mucous membranes are moist.  Eyes:     Pupils: Pupils are equal, round, and reactive to light.  Neck:     Vascular: No carotid bruit.  Cardiovascular:     Rate and Rhythm: Normal rate and regular rhythm.     Pulses: Normal pulses.     Heart sounds: Normal heart sounds.  Pulmonary:     Effort: Pulmonary effort is normal.     Breath sounds: Normal breath sounds.  Abdominal:     General: Bowel sounds are normal.     Palpations: Abdomen is soft. There is no hepatomegaly, splenomegaly or mass.     Tenderness: There is no abdominal tenderness.     Hernia: No hernia is present.  Musculoskeletal:     Cervical back: Neck supple.     Right lower leg: No edema.     Left lower leg: No edema.     Comments: Tightness and spasm in the hamstring  Skin:    Findings: No rash.  Neurological:     Mental Status: He is alert and oriented to person, place, and time.     Motor: No weakness.    Psychiatric:        Mood and Affect: Mood normal.        Behavior: Behavior normal.  BP 126/78   Pulse 68   Ht 5\' 7"  (1.702 m)   Wt 167 lb (75.8 kg)   BMI 26.16 kg/m  Wt Readings from Last 3 Encounters:  03/19/20 167 lb (75.8 kg)  02/12/20 169 lb (76.7 kg)  01/05/20 167 lb 9.6 oz (76 kg)    Health Maintenance Due  Topic Date Due  . TETANUS/TDAP  Never done  . PNA vac Low Risk Adult (1 of 2 - PCV13) Never done    There are no preventive care reminders to display for this patient.  CBC Latest Ref Rng & Units 07/02/2019 06/18/2017 10/05/2011  WBC 4.0 - 10.5 K/uL 8.5 7.1 5.8  Hemoglobin 13.0 - 17.0 g/dL 14.5 13.6 14.3  Hematocrit 39 - 52 % 42.2 39.9(L) 42.2  Platelets 150 - 400 K/uL 163 156 171   CMP Latest Ref Rng & Units 07/02/2019 06/18/2017 02/12/2017  Glucose 70 - 99 mg/dL 94 140(H) -  BUN 8 - 23 mg/dL 20 27(H) -  Creatinine 0.61 - 1.24 mg/dL 1.25(H) 1.61(H) 1.30(H)  Sodium 135 - 145 mmol/L 141 138 -  Potassium 3.5 - 5.1 mmol/L 3.9 4.1 -  Chloride 98 - 111 mmol/L 105 106 -  CO2 22 - 32 mmol/L 27 24 -  Calcium 8.9 - 10.3 mg/dL 9.2 9.1 -    No results found for: TSH Lab Results  Component Value Date   ANIONGAP 9 07/02/2019   No results found for: CHOL, HDL, LDLCALC, CHOLHDL No results found for: TRIG No results found for: HGBA1C    ASSESSMENT & PLAN:   Problem List Items Addressed This Visit      Cardiovascular and Mediastinum   Atrial fibrillation ([place]) - Primary    Atrial fibrulation is under control. Protime at 27.1, INR at 2.4, Vital signs stable      Relevant Orders   POCT INR (Completed)     Nervous and Auditory   Primary Parkinsonism ([place])    Pt has tremors due to primary parkinsonian syndrome. His gait is atexic         Other   Restless legs syndrome ([facility])    Stable on trazodone      Cramps of right lower extremity    Pt complainied of pain in the right leg. He was found to be tender in the hamstring medial head. No evidence of  DVT. I told him to reduce lipitor to 40mg  daily, use massage to right lower thigh and use mustard as needed or vinegar with water to get rid of cramps. Return as needed         No orders of the defined types were placed in this encounter.    Follow-up: Return in about 4 weeks (around 04/16/2020).  Recommends reducing Lipitor to 40mg   Dr. Jane Canary [facility] [address] [place], [place] [address]   By signing my name below, I, Dawayne Cirri, attest that this documentation has been prepared under the direction and in the presence of Cletis Athens, MD. Electronically Signed: Cletis Athens, MD 03/19/20, 2:34 PM   I personally performed the services described in this documentation, which was SCRIBED in my presence. The recorded information has been reviewed and considered accurate. It has been edited as necessary during review. Cletis Athens, MD

## 2020-03-19 NOTE — Assessment & Plan Note (Signed)
Pt has tremors due to primary parkinsonian syndrome. His gait is atexic

## 2020-03-19 NOTE — Assessment & Plan Note (Signed)
Atrial fibrulation is under control. Protime at 27.1, INR at 2.4, Vital signs stable

## 2020-03-19 NOTE — Assessment & Plan Note (Signed)
Stable on trazodone

## 2020-03-31 ENCOUNTER — Encounter: Payer: Self-pay | Admitting: Internal Medicine

## 2020-03-31 ENCOUNTER — Other Ambulatory Visit: Payer: Self-pay

## 2020-03-31 ENCOUNTER — Ambulatory Visit (INDEPENDENT_AMBULATORY_CARE_PROVIDER_SITE_OTHER): Payer: Medicare Other | Admitting: Internal Medicine

## 2020-03-31 DIAGNOSIS — I4811 Longstanding persistent atrial fibrillation: Secondary | ICD-10-CM

## 2020-03-31 DIAGNOSIS — R1032 Left lower quadrant pain: Secondary | ICD-10-CM

## 2020-03-31 DIAGNOSIS — I10 Essential (primary) hypertension: Secondary | ICD-10-CM

## 2020-03-31 DIAGNOSIS — G20 Parkinson's disease: Secondary | ICD-10-CM

## 2020-03-31 DIAGNOSIS — N23 Unspecified renal colic: Secondary | ICD-10-CM | POA: Diagnosis not present

## 2020-03-31 LAB — POCT URINALYSIS DIPSTICK
Bilirubin, UA: NEGATIVE
Glucose, UA: NEGATIVE
Ketones, UA: NEGATIVE
Leukocytes, UA: NEGATIVE
Nitrite, UA: NEGATIVE
Protein, UA: POSITIVE — AB
Spec Grav, UA: >=1.03 — AB (ref 1.010–1.025)
Urobilinogen, UA: 0.2 E.U./dL
pH, UA: 5.5 (ref 5.0–8.0)

## 2020-03-31 MED ORDER — KETOROLAC TROMETHAMINE 30 MG/ML IJ SOLN
30.0000 mg | Freq: Once | INTRAMUSCULAR | Status: AC
  Administered 2020-03-31: 30 mg via INTRAMUSCULAR

## 2020-03-31 NOTE — Assessment & Plan Note (Signed)
Patient is taking Vasotec 5 mg p.o. daily

## 2020-03-31 NOTE — Assessment & Plan Note (Signed)
Atrial fibrillation is under control at the present time.  He denies any chest pain.  denies  any syncope

## 2020-03-31 NOTE — Progress Notes (Signed)
Established Patient Office Visit  Subjective:  Patient ID: Kyle Santos, male    DOB: [birth year]  Age: 84 y.o. MRN: [id number]  CC:  Chief Complaint  Patient presents with  . Abdominal Pain    Patient presents with lower left sided abdominal pain, pt states he has a hx of kidney stones    HPI  Kyle Santos presents for lt renal colic  Past Medical History:  Diagnosis Date  . A-fib ([place])   . Arthritis   . BPH (benign prostatic hyperplasia)   . Depression   . Heart murmur   . HOH (hard of hearing)   . Hypercholesteremia   . Hypertension   . Loss of equilibrium    being evaluated at this time.  . Pneumonia    history of  . Sleep apnea     Past Surgical History:  Procedure Laterality Date  . CATARACT EXTRACTION W/PHACO Right 01/09/2017   Procedure: CATARACT EXTRACTION PHACO AND INTRAOCULAR LENS PLACEMENT (IOC);  Surgeon: Birder Robson, MD;  Location: [facility]S;  Service: Ophthalmology;  Laterality: Right;  [place] 01:06 AP% 19.7 CDE 13.18 Fluid pack lot # [id number]  . CATARACT EXTRACTION W/PHACO Left 08/07/2017   Procedure: CATARACT EXTRACTION PHACO AND INTRAOCULAR LENS PLACEMENT (IOC)-LEFT;  Surgeon: Birder Robson, MD;  Location: [facility] ORS;  Service: Ophthalmology;  Laterality: Left;  [place]  00:43 AP% 15.0 CDE 6.50 Fluid pack lot # [id number]  . COLONOSCOPY    . JOINT REPLACEMENT     knee  . JOINT REPLACEMENT     shoulder    Family History  Problem Relation Age of Onset  . Heart disease Father     Social History   Socioeconomic History  . Marital status: Married    Spouse name: Not on file  . Number of children: Not on file  . Years of education: Not on file  . Highest education level: Not on file  Occupational History  . Not on file  Tobacco Use  . Smoking status: Former Smoker    Quit date: 08/22/1943    Years since quitting: 76.6  . Smokeless tobacco: Former Network engineer and Sexual Activity  . Alcohol use: Yes    Alcohol/week: 0.0 standard  drinks    Comment: occasional  . Drug use: No  . Sexual activity: Not on file  Other Topics Concern  . Not on file  Social History Narrative   Drinks about 2 cups of caffeine.   Social Determinants of Health   Financial Resource Strain:   . Difficulty of Paying Living Expenses:   Food Insecurity:   . Worried About Charity fundraiser in the Last Year:   . Arboriculturist in the Last Year:   Transportation Needs:   . Film/video editor (Medical):   Marland Kitchen Lack of Transportation (Non-Medical):   Physical Activity:   . Days of Exercise per Week:   . Minutes of Exercise per Session:   Stress:   . Feeling of Stress :   Social Connections:   . Frequency of Communication with Friends and Family:   . Frequency of Social Gatherings with Friends and Family:   . Attends Religious Services:   . Active Member of Clubs or Organizations:   . Attends Archivist Meetings:   Marland Kitchen Marital Status:   Intimate Partner Violence:   . Fear of Current or Ex-Partner:   . Emotionally Abused:   Marland Kitchen Physically Abused:   . Sexually Abused:  Current Outpatient Medications:  .  ampicillin (PRINCIPEN) 500 MG capsule, Take 1 capsule (500 mg total) by mouth 4 (four) times daily., Disp: 8 capsule, Rfl: 0 .  atorvastatin (LIPITOR) 80 MG tablet, Take 80 mg by mouth daily. , Disp: , Rfl:  .  B Complex Vitamins (B COMPLEX-B12 PO), Take 1 tablet by mouth daily., Disp: , Rfl:  .  Cascara Sagrada 450 MG CAPS, Take 450 mg by mouth 2 (two) times daily., Disp: , Rfl:  .  Cholecalciferol (VITAMIN D3) 1000 units CAPS, Take 1,000 Units by mouth daily at 3 pm., Disp: , Rfl:  .  enalapril (VASOTEC) 5 MG tablet, Take 1 tablet (5 mg total) by mouth at bedtime., Disp: 9030 tablet, Rfl: 2 .  ibuprofen (ADVIL,MOTRIN) 200 MG tablet, Take 400 mg by mouth every 6 (six) hours as needed for headache or moderate pain., Disp: , Rfl:  .  Melatonin 5 MG TABS, Take 5 mg by mouth at bedtime. , Disp: , Rfl:  .  tamsulosin  (FLOMAX) 0.4 MG CAPS capsule, Take 0.4 mg by mouth daily. , Disp: , Rfl:  .  traZODone (DESYREL) 50 MG tablet, Take 25 mg by mouth at bedtime., Disp: , Rfl:  .  warfarin (COUMADIN) 7.5 MG tablet, Take 7.5 mg by mouth daily in the morning, Disp: , Rfl:    No Known Allergies  ROS Review of Systems  Constitutional: Negative for appetite change.  HENT: Negative for congestion.   Eyes: Negative for pain.  Respiratory: Negative for chest tightness.   Cardiovascular: Positive for chest pain.  Gastrointestinal: Positive for abdominal pain and constipation. Negative for abdominal distention, blood in stool, diarrhea and nausea.  Genitourinary: Positive for hematuria. Negative for dysuria.  Musculoskeletal: Negative for back pain.  Psychiatric/Behavioral: Negative for behavioral problems.      Objective:    Physical Exam Vitals reviewed.  Constitutional:      Appearance: Normal appearance.  HENT:     Mouth/Throat:     Mouth: Mucous membranes are moist.  Eyes:     Pupils: Pupils are equal, round, and reactive to light.  Neck:     Vascular: No carotid bruit.  Cardiovascular:     Rate and Rhythm: Rhythm irregular.     Pulses: Normal pulses.     Heart sounds: Normal heart sounds.  Pulmonary:     Effort: Pulmonary effort is normal.     Breath sounds: Normal breath sounds.  Abdominal:     General: Bowel sounds are normal.     Palpations: Abdomen is soft. There is no hepatomegaly, splenomegaly or mass.     Tenderness: There is no abdominal tenderness.     Hernia: No hernia is present.  Musculoskeletal:     Cervical back: Neck supple.     Right lower leg: No edema.     Left lower leg: No edema.  Skin:    Findings: No rash.  Neurological:     Mental Status: He is alert and oriented to person, place, and time.     Motor: No weakness.  Psychiatric:        Mood and Affect: Mood normal.        Behavior: Behavior normal.     There were no vitals taken for this visit. Wt Readings  from Last 3 Encounters:  03/19/20 167 lb (75.8 kg)  02/12/20 169 lb (76.7 kg)  01/05/20 167 lb 9.6 oz (76 kg)     Health Maintenance Due  Topic Date Due  .  TETANUS/TDAP  Never done  . PNA vac Low Risk Adult (1 of 2 - PCV13) Never done  . INFLUENZA VACCINE  03/21/2020    There are no preventive care reminders to display for this patient.  No results found for: TSH Lab Results  Component Value Date   WBC 8.5 07/02/2019   HGB 14.5 07/02/2019   HCT 42.2 07/02/2019   MCV 93.2 07/02/2019   PLT 163 07/02/2019   Lab Results  Component Value Date   NA 141 07/02/2019   K 3.9 07/02/2019   CO2 27 07/02/2019   GLUCOSE 94 07/02/2019   BUN 20 07/02/2019   CREATININE 1.25 (H) 07/02/2019   CALCIUM 9.2 07/02/2019   ANIONGAP 9 07/02/2019   No results found for: CHOL No results found for: HDL No results found for: LDLCALC No results found for: TRIG No results found for: CHOLHDL No results found for: HGBA1C    Assessment & Plan:   Problem List Items Addressed This Visit      Cardiovascular and Mediastinum   Atrial fibrillation ([place])    Atrial fibrillation is under control at the present time.  He denies any chest pain.  denies  any syncope      Essential hypertension    Patient is taking Vasotec 5 mg p.o. daily        Nervous and Auditory   Primary Parkinsonism ([facility])    Stable at the present time.  She has ataxic gait        Other   Renal colic on left side - Primary    Patient presented to the office in the pain on the left lower quadrant.  He says he is constipated over the last few days.  Denies any history of fever or chills.  He was given Toradol 30 mg IM.  His abdomen was soft there was no tenderness on palpation bowel sounds normal audible.  He was found to have blood in the urine.  Patient is known to have a right renal colic before.  I told him that if the pain recurred or he continues to have persistent symptoms then he should go to the hospital       Other  Visit Diagnoses    Left lower quadrant abdominal pain       Relevant Medications   ketorolac (TORADOL) 30 MG/ML injection 30 mg (Completed)   Other Relevant Orders   POCT urinalysis dipstick (Completed)      Meds ordered this encounter  Medications  . ketorolac (TORADOL) 30 MG/ML injection 30 mg    Follow-up: No follow-ups on file.    Cletis Athens, MD

## 2020-03-31 NOTE — Assessment & Plan Note (Signed)
Patient presented to the office in the pain on the left lower quadrant.  He says he is constipated over the last few days.  Denies any history of fever or chills.  He was given Toradol 30 mg IM.  His abdomen was soft there was no tenderness on palpation bowel sounds normal audible.  He was found to have blood in the urine.  Patient is known to have a right renal colic before.  I told him that if the pain recurred or he continues to have persistent symptoms then he should go to the hospital

## 2020-03-31 NOTE — Assessment & Plan Note (Signed)
Stable at the present time.  She has ataxic gait

## 2020-04-01 ENCOUNTER — Other Ambulatory Visit: Payer: Self-pay

## 2020-04-01 DIAGNOSIS — R1032 Left lower quadrant pain: Secondary | ICD-10-CM

## 2020-04-01 DIAGNOSIS — R319 Hematuria, unspecified: Secondary | ICD-10-CM

## 2020-04-05 ENCOUNTER — Encounter: Payer: Self-pay | Admitting: Urology

## 2020-04-05 ENCOUNTER — Ambulatory Visit (INDEPENDENT_AMBULATORY_CARE_PROVIDER_SITE_OTHER): Payer: Medicare Other | Admitting: Urology

## 2020-04-05 ENCOUNTER — Ambulatory Visit
Admission: RE | Admit: 2020-04-05 | Discharge: 2020-04-05 | Disposition: A | Discharge status: Home / Self Care | Payer: Medicare Other | Source: Ambulatory Visit | Attending: Urology | Admitting: Urology

## 2020-04-05 ENCOUNTER — Telehealth: Payer: Self-pay

## 2020-04-05 ENCOUNTER — Other Ambulatory Visit: Payer: Self-pay

## 2020-04-05 VITALS — BP 102/63 | HR 73 | Ht 67.0 in | Wt 165.0 lb

## 2020-04-05 DIAGNOSIS — R1032 Left lower quadrant pain: Secondary | ICD-10-CM | POA: Diagnosis not present

## 2020-04-05 DIAGNOSIS — N3000 Acute cystitis without hematuria: Secondary | ICD-10-CM

## 2020-04-05 DIAGNOSIS — R319 Hematuria, unspecified: Secondary | ICD-10-CM | POA: Diagnosis not present

## 2020-04-05 DIAGNOSIS — I700 Atherosclerosis of aorta: Secondary | ICD-10-CM | POA: Diagnosis not present

## 2020-04-05 DIAGNOSIS — I708 Atherosclerosis of other arteries: Secondary | ICD-10-CM | POA: Diagnosis not present

## 2020-04-05 DIAGNOSIS — N281 Cyst of kidney, acquired: Secondary | ICD-10-CM | POA: Diagnosis not present

## 2020-04-05 LAB — URINALYSIS, COMPLETE
Bilirubin, UA: NEGATIVE
Glucose, UA: NEGATIVE
Ketones, UA: NEGATIVE
Nitrite, UA: NEGATIVE
Specific Gravity, UA: >1.03 — ABNORMAL HIGH (ref 1.005–1.030)
Urobilinogen, Ur: 0.2 mg/dL (ref 0.2–1.0)
pH, UA: 5.5 (ref 5.0–7.5)

## 2020-04-05 LAB — BLADDER SCAN AMB NON-IMAGING

## 2020-04-05 LAB — MICROSCOPIC EXAMINATION
RBC, Urine: NONE SEEN /hpf (ref 0–2)
WBC, UA: >30 /hpf — ABNORMAL HIGH (ref 0–5)

## 2020-04-05 MED ORDER — SULFAMETHOXAZOLE-TRIMETHOPRIM 800-160 MG PO TABS: 1.0000 | ORAL_TABLET | Freq: Two times a day (BID) | ORAL | 0 refills | Status: DC

## 2020-04-05 NOTE — Telephone Encounter (Signed)
-----   Message from Billey Co, MD sent at 04/05/2020 12:50 PM EDT ----- Regarding: CT results Good news, no kidney stones or kidney blockage seen on CT. Lets try Bactrim DS BID x 10 days for possible UTI, will call with culture results. RTC 6-8 weeks symptom check, sooner if not improving after a few days of abx.  Nickolas Madrid, MD 04/05/2020  ----- Message ----- From: Interface, Rad Results In Sent: 04/05/2020  12:43 PM EDT To: Billey Co, MD

## 2020-04-05 NOTE — Telephone Encounter (Signed)
Called pt's wife per DPR. Informed her of the information below. Pt gave verbal understanding. RX sent in. Appt scheduled.

## 2020-04-05 NOTE — Progress Notes (Signed)
04/05/20 12:45 PM   Kyle Santos June 24, 1936 [id number]  CC: Left groin pain, possible kidney stone, hematuria, history of urethral stricture  HPI: I saw Kyle Santos and his wife in urology clinic today for evaluation of left groin pain.  He reports 1 week of left groin pain that is similar to prior stone episodes.  His pain has improved over the last day or 2.  He was told by his PCP that he he had blood in the urine, but denies any gross hematuria.  On my review of the notes, there is just dipstick positive urine but no microscopic analysis was performed.  He has been taking naproxen for pain.  He denies any fevers or chills.  Interestingly, he reports a history of a urethral stricture that was previously followed by Dr. Jacqlyn Larsen.  He reports he is intermittently catheterized over the last few years, and most recently catheterized about a week ago when he felt like he may not be emptying completely.  He has not had a cystoscopy recently.  He has a distant history of ureteroscopy and stent with Dr. Jacqlyn Larsen.  He denies any recurrent UTIs or gross hematuria.  Urinalysis today equivocal with greater than 30 WBCs, 0 RBCs, few bacteria, nitrite negative, 1+ leukocytes.  PMH: Past Medical History:  Diagnosis Date  . A-fib ([place])   . Arthritis   . BPH (benign prostatic hyperplasia)   . Depression   . Heart murmur   . HOH (hard of hearing)   . Hypercholesteremia   . Hypertension   . Loss of equilibrium    being evaluated at this time.  . Pneumonia    history of  . Sleep apnea     Surgical History: Past Surgical History:  Procedure Laterality Date  . CATARACT EXTRACTION W/PHACO Right 01/09/2017   Procedure: CATARACT EXTRACTION PHACO AND INTRAOCULAR LENS PLACEMENT (IOC);  Surgeon: Birder Robson, MD;  Location: [facility] ORS;  Service: Ophthalmology;  Laterality: Right;  [place] 01:06 AP% 19.7 CDE 13.18 Fluid pack lot # [id number]  . CATARACT EXTRACTION W/PHACO Left 08/07/2017   Procedure: CATARACT  EXTRACTION PHACO AND INTRAOCULAR LENS PLACEMENT (IOC)-LEFT;  Surgeon: Birder Robson, MD;  Location: [facility] ORS;  Service: Ophthalmology;  Laterality: Left;  [place]  00:43 AP% 15.0 CDE 6.50 Fluid pack lot # [id number]  . COLONOSCOPY    . JOINT REPLACEMENT     knee  . JOINT REPLACEMENT     shoulder    Social History:  reports that he quit smoking about 76 years ago. He has quit using smokeless tobacco. He reports current alcohol use. He reports that he does not use drugs.  Physical Exam: BP 102/63 (BP Location: Left Arm, Patient Position: Sitting, Cuff Size: Normal)   Pulse 73   Ht 5\' 7"  (1.702 m)   Wt 165 lb (74.8 kg)   BMI 25.84 kg/m    Constitutional:  Alert and oriented, No acute distress. Cardiovascular: No clubbing, cyanosis, or edema. Respiratory: Normal respiratory effort, no increased work of breathing. GI: Abdomen is soft, nontender, nondistended, no abdominal masses  Laboratory Data: Reviewed, see HPI  Pertinent Imaging: I ordered a stat CT abdomen pelvis stone protocol today for evaluation of kidney stones.  I personally reviewed the imaging which shows no evidence of hydronephrosis or nephrolithiasis, and an empty bladder.  Assessment & Plan:   In summary, he is an 84 year old male with left-sided groin pain likely related to either a spontaneously passed kidney stone, or possible UTI.  We discussed  that it is difficult to differentiate UTI from inflammation/bacteria from recent self-catheterization.  CT today shows no evidence of kidney stones or obstruction.  Urinalysis equivocal, and will send for culture and treat as a suspected UTI.  Return precautions discussed at length.  Bactrim DS twice daily x10 days for suspected UTI, follow-up culture results RTC 6 to 8 weeks for symptom check  Nickolas Madrid, MD 04/05/2020  [place] [address] [place], [place] [address] [phone]

## 2020-04-10 LAB — CULTURE, URINE COMPREHENSIVE

## 2020-04-12 ENCOUNTER — Telehealth: Payer: Self-pay

## 2020-04-12 DIAGNOSIS — B952 Enterococcus as the cause of diseases classified elsewhere: Secondary | ICD-10-CM

## 2020-04-12 MED ORDER — CIPROFLOXACIN HCL 500 MG PO TABS: 500.0000 mg | ORAL_TABLET | Freq: Two times a day (BID) | ORAL | 0 refills | Status: AC

## 2020-04-12 NOTE — Telephone Encounter (Signed)
-----   Message from Billey Co, MD sent at 04/12/2020 10:52 AM EDT ----- Please change him to cipro 500mg  bid X 10 DAYS, based on final culture results. OK to stop bactrim  Thanks Nickolas Madrid, MD 04/12/2020

## 2020-04-12 NOTE — Telephone Encounter (Signed)
Called pt's home number, no answer. No voicemail.

## 2020-04-13 NOTE — Telephone Encounter (Signed)
Called pt's home number no answer. Called pt's cell, Left detailed message for pt informing him of the information below.

## 2020-04-19 ENCOUNTER — Encounter: Payer: Self-pay | Admitting: Internal Medicine

## 2020-04-19 ENCOUNTER — Other Ambulatory Visit: Payer: Self-pay

## 2020-04-19 ENCOUNTER — Ambulatory Visit (INDEPENDENT_AMBULATORY_CARE_PROVIDER_SITE_OTHER): Payer: Medicare Other | Admitting: Internal Medicine

## 2020-04-19 DIAGNOSIS — Z9989 Dependence on other enabling machines and devices: Secondary | ICD-10-CM

## 2020-04-19 DIAGNOSIS — Z0000 Encounter for general adult medical examination without abnormal findings: Secondary | ICD-10-CM | POA: Diagnosis not present

## 2020-04-19 DIAGNOSIS — R0782 Intercostal pain: Secondary | ICD-10-CM

## 2020-04-19 DIAGNOSIS — G4733 Obstructive sleep apnea (adult) (pediatric): Secondary | ICD-10-CM

## 2020-04-19 DIAGNOSIS — I4811 Longstanding persistent atrial fibrillation: Secondary | ICD-10-CM | POA: Diagnosis not present

## 2020-04-19 DIAGNOSIS — N200 Calculus of kidney: Secondary | ICD-10-CM

## 2020-04-19 DIAGNOSIS — I10 Essential (primary) hypertension: Secondary | ICD-10-CM

## 2020-04-19 DIAGNOSIS — G2581 Restless legs syndrome: Secondary | ICD-10-CM

## 2020-04-19 LAB — POCT INR: INR: 2.2 (ref 2.0–3.0)

## 2020-04-19 NOTE — Assessment & Plan Note (Signed)
Pt has a recent CT scan which showed multiple cyst in both kidneys and no obstructive stone was seen on either side.

## 2020-04-19 NOTE — Assessment & Plan Note (Signed)
-   Today, the patient's blood pressure is low today and he also complained of cough, so I asked him to stop his lisinopril.  - I encouraged the patient to eat a low-sodium diet to help control blood pressure. - I encouraged the patient to live an active lifestyle and complete activities that increases heart rate to 85% target heart rate at least 5 times per week for one hour.

## 2020-04-19 NOTE — Progress Notes (Signed)
Established Patient Office Visit  SUBJECTIVE:  Subjective  Patient ID: Kyle Santos, male    DOB: 19-Apr-1936  Age: 84 y.o. MRN: [id number]  CC:  Chief Complaint  Patient presents with  . Annual Exam  . Atrial Fibrillation    HPI Kyle Santos is a 84 y.o. male presenting today for an annual exam and routine evaluation of his atrial fibrillation.  He saw Dr. Diamantina Providence on 04/05/2020 for left groin pain for one week. He was prescribed Bactrim DS BID x10 days. Urine Culture revealed enterococcus faecalis, so he was switched to Cipro 500mg  BID x10 days.  He notes that he had a little instability this morning where he stood up, but he felt like he was still moving.   He is going to have some dental work done and will need to stop his coumadin temporarily. He will also need a PT/INR reading for the dentist.   Past Medical History:  Diagnosis Date  . A-fib ([place])   . Arthritis   . BPH (benign prostatic hyperplasia)   . Depression   . Heart murmur   . HOH (hard of hearing)   . Hypercholesteremia   . Hypertension   . Loss of equilibrium    being evaluated at this time.  . Pneumonia    history of  . Sleep apnea     Past Surgical History:  Procedure Laterality Date  . CATARACT EXTRACTION W/PHACO Right 01/09/2017   Procedure: CATARACT EXTRACTION PHACO AND INTRAOCULAR LENS PLACEMENT (IOC);  Surgeon: Birder Robson, MD;  Location: [facility] ORS;  Service: Ophthalmology;  Laterality: Right;  [place] 01:06 AP% 19.7 CDE 13.18 Fluid pack lot # [id number]  . CATARACT EXTRACTION W/PHACO Left 08/07/2017   Procedure: CATARACT EXTRACTION PHACO AND INTRAOCULAR LENS PLACEMENT (IOC)-LEFT;  Surgeon: Birder Robson, MD;  Location: [facility] ORS;  Service: Ophthalmology;  Laterality: Left;  [place]  00:43 AP% 15.0 CDE 6.50 Fluid pack lot # [id number]  . COLONOSCOPY    . JOINT REPLACEMENT     knee  . JOINT REPLACEMENT     shoulder    Family History  Problem Relation Age of Onset  . Heart disease Father       Social History   Socioeconomic History  . Marital status: Married    Spouse name: Not on file  . Number of children: Not on file  . Years of education: Not on file  . Highest education level: Not on file  Occupational History  . Not on file  Tobacco Use  . Smoking status: Former Smoker    Quit date: 08/22/1943    Years since quitting: 76.7  . Smokeless tobacco: Former Network engineer and Sexual Activity  . Alcohol use: Yes    Alcohol/week: 0.0 standard drinks    Comment: occasional  . Drug use: No  . Sexual activity: Not Currently    Birth control/protection: None  Other Topics Concern  . Not on file  Social History Narrative   Drinks about 2 cups of caffeine.   Social Determinants of Health   Financial Resource Strain:   . Difficulty of Paying Living Expenses: Not on file  Food Insecurity:   . Worried About Charity fundraiser in the Last Year: Not on file  . Ran Out of Food in the Last Year: Not on file  Transportation Needs:   . Lack of Transportation (Medical): Not on file  . Lack of Transportation (Non-Medical): Not on file  Physical Activity:   . Days  of Exercise per Week: Not on file  . Minutes of Exercise per Session: Not on file  Stress:   . Feeling of Stress : Not on file  Social Connections:   . Frequency of Communication with Friends and Family: Not on file  . Frequency of Social Gatherings with Friends and Family: Not on file  . Attends Religious Services: Not on file  . Active Member of Clubs or Organizations: Not on file  . Attends Archivist Meetings: Not on file  . Marital Status: Not on file  Intimate Partner Violence:   . Fear of Current or Ex-Partner: Not on file  . Emotionally Abused: Not on file  . Physically Abused: Not on file  . Sexually Abused: Not on file     Current Outpatient Medications:  .  acyclovir (ZOVIRAX) 800 MG tablet, Take 800 mg by mouth 4 (four) times daily., Disp: , Rfl:  .  ampicillin (PRINCIPEN) 500 MG  capsule, Take 1 capsule (500 mg total) by mouth 4 (four) times daily., Disp: 8 capsule, Rfl: 0 .  atorvastatin (LIPITOR) 80 MG tablet, Take 80 mg by mouth daily. , Disp: , Rfl:  .  B Complex Vitamins (B COMPLEX-B12 PO), Take 1 tablet by mouth daily., Disp: , Rfl:  .  Cascara [place] 450 MG CAPS, Take 450 mg by mouth 2 (two) times daily., Disp: , Rfl:  .  Cholecalciferol (VITAMIN D3) 1000 units CAPS, Take 1,000 Units by mouth daily at 3 pm., Disp: , Rfl:  .  ciprofloxacin (CIPRO) 500 MG tablet, Take 1 tablet (500 mg total) by mouth 2 (two) times daily for 10 days., Disp: 20 tablet, Rfl: 0 .  ibuprofen (ADVIL,MOTRIN) 200 MG tablet, Take 400 mg by mouth every 6 (six) hours as needed for headache or moderate pain., Disp: , Rfl:  .  Melatonin 5 MG TABS, Take 5 mg by mouth at bedtime. , Disp: , Rfl:  .  tamsulosin (FLOMAX) 0.4 MG CAPS capsule, Take 0.4 mg by mouth daily. , Disp: , Rfl:  .  traZODone (DESYREL) 50 MG tablet, Take 25 mg by mouth at bedtime., Disp: , Rfl:  .  warfarin (COUMADIN) 7.5 MG tablet, Take 7.5 mg by mouth daily in the morning, Disp: , Rfl:    No Known Allergies  ROS Review of Systems  Constitutional: Negative.   HENT: Negative.   Eyes: Negative.   Respiratory: Positive for cough (dry). Negative for shortness of breath.   Cardiovascular: Positive for chest pain (staying in one place, he feels like it's muscular; 4/10). Negative for leg swelling.  Gastrointestinal: Negative.   Endocrine: Negative.   Genitourinary: Positive for scrotal swelling.  Musculoskeletal: Positive for arthralgias (knees, ankles).  Skin: Negative.   Allergic/Immunologic: Negative.   Neurological: Positive for dizziness (after standing).  Hematological: Negative.   Psychiatric/Behavioral: Negative.   All other systems reviewed and are negative.    OBJECTIVE:    Physical Exam Vitals reviewed.  Constitutional:      Appearance: Normal appearance.  HENT:     Mouth/Throat:     Mouth: Mucous  membranes are moist.  Eyes:     Pupils: Pupils are equal, round, and reactive to light.  Neck:     Vascular: No carotid bruit.  Cardiovascular:     Rate and Rhythm: Normal rate and regular rhythm.     Pulses: Normal pulses.     Heart sounds: Normal heart sounds.  Pulmonary:     Effort: Pulmonary effort is normal.  Breath sounds: Normal breath sounds.  Abdominal:     General: Bowel sounds are normal.     Palpations: Abdomen is soft. There is no hepatomegaly, splenomegaly or mass.     Tenderness: There is no abdominal tenderness.     Hernia: No hernia is present.  Musculoskeletal:     Cervical back: Neck supple.     Right lower leg: No edema.     Left lower leg: No edema.  Skin:    Findings: No rash.  Neurological:     Mental Status: He is alert and oriented to person, place, and time.     Motor: No weakness.  Psychiatric:        Mood and Affect: Mood normal.        Behavior: Behavior normal.     There were no vitals taken for this visit. Wt Readings from Last 3 Encounters:  04/05/20 165 lb (74.8 kg)  03/19/20 167 lb (75.8 kg)  02/12/20 169 lb (76.7 kg)    Health Maintenance Due  Topic Date Due  . TETANUS/TDAP  Never done  . PNA vac Low Risk Adult (1 of 2 - PCV13) Never done  . INFLUENZA VACCINE  03/21/2020    There are no preventive care reminders to display for this patient.  CBC Latest Ref Rng & Units 07/02/2019 06/18/2017 10/05/2011  WBC 4.0 - 10.5 K/uL 8.5 7.1 5.8  Hemoglobin 13.0 - 17.0 g/dL 14.5 13.6 14.3  Hematocrit 39 - 52 % 42.2 39.9(L) 42.2  Platelets 150 - 400 K/uL 163 156 171   CMP Latest Ref Rng & Units 07/02/2019 06/18/2017 02/12/2017  Glucose 70 - 99 mg/dL 94 140(H) -  BUN 8 - 23 mg/dL 20 27(H) -  Creatinine 0.61 - 1.24 mg/dL 1.25(H) 1.61(H) 1.30(H)  Sodium 135 - 145 mmol/L 141 138 -  Potassium 3.5 - 5.1 mmol/L 3.9 4.1 -  Chloride 98 - 111 mmol/L 105 106 -  CO2 22 - 32 mmol/L 27 24 -  Calcium 8.9 - 10.3 mg/dL 9.2 9.1 -    No results  found for: TSH Lab Results  Component Value Date   ANIONGAP 9 07/02/2019   No results found for: CHOL, HDL, LDLCALC, CHOLHDL No results found for: TRIG No results found for: HGBA1C    ASSESSMENT & PLAN:   Problem List Items Addressed This Visit      Cardiovascular and Mediastinum   Atrial fibrillation ([place]) - Primary    Pt has paroxysmal atrial fibrillation. He is in sinus rhythm today. He was advised to continue taking his coumadin . He is to stop taking coumadin 5 days prior to going to the dentist for teeth extraction, and is to resume it 4 days after.       Relevant Orders   POCT INR (Completed)   Essential hypertension    - Today, the patient's blood pressure is low today and he also complained of cough, so I asked him to stop his lisinopril.  - I encouraged the patient to eat a low-sodium diet to help control blood pressure. - I encouraged the patient to live an active lifestyle and complete activities that increases heart rate to 85% target heart rate at least 5 times per week for one hour.            Respiratory   OSA on CPAP    Stable at the current time.         Genitourinary   Kidney stone    Pt has  a recent CT scan which showed multiple cyst in both kidneys and no obstructive stone was seen on either side.         Other   Restless legs syndrome (RLS)    Stable at the present time.       Intercostal pain    Pt complained of left sided chest pain grade 4/10. There is no evidence of pericorditis on clinical exam. Pain does not radiate to shoulder or arm. His EKG does not show any acute changes.          No orders of the defined types were placed in this encounter.   EKG: normal sinus rhythm; Poor R wave progression in V1, V2, V3; Nonspecific ST changes notes; 1st Degree AV block.  Follow-up: No follow-ups on file.    Dr. Jane Canary [facility] [address], [place], [place] [address]   By signing my name below, I, [employer], attest that this documentation has been prepared under the direction and in the presence of Cletis Athens, MD. Electronically Signed: Cletis Athens, MD 04/19/20, 11:40 AM   I personally performed the services described in this documentation, which was SCRIBED in my presence. The recorded information has been reviewed and considered accurate. It has been edited as necessary during review. Cletis Athens, MD

## 2020-04-19 NOTE — Assessment & Plan Note (Signed)
Pt has paroxysmal atrial fibrillation. He is in sinus rhythm today. He was advised to continue taking his coumadin . He is to stop taking coumadin 5 days prior to going to the dentist for teeth extraction, and is to resume it 4 days after.

## 2020-04-19 NOTE — Assessment & Plan Note (Signed)
Pt complained of left sided chest pain grade 4/10. There is no evidence of pericorditis on clinical exam. Pain does not radiate to shoulder or arm. His EKG does not show any acute changes.

## 2020-04-19 NOTE — Assessment & Plan Note (Signed)
Stable at the current time.

## 2020-04-19 NOTE — Assessment & Plan Note (Signed)
Stable at the present time. 

## 2020-05-11 ENCOUNTER — Encounter: Payer: Self-pay | Admitting: Internal Medicine

## 2020-05-11 ENCOUNTER — Other Ambulatory Visit: Payer: Self-pay

## 2020-05-11 ENCOUNTER — Ambulatory Visit (INDEPENDENT_AMBULATORY_CARE_PROVIDER_SITE_OTHER): Payer: Medicare Other | Admitting: Internal Medicine

## 2020-05-11 VITALS — BP 120/74 | HR 65 | Ht 67.0 in | Wt 162.4 lb

## 2020-05-11 DIAGNOSIS — G20 Parkinson's disease: Secondary | ICD-10-CM | POA: Diagnosis not present

## 2020-05-11 DIAGNOSIS — I4891 Unspecified atrial fibrillation: Secondary | ICD-10-CM | POA: Diagnosis not present

## 2020-05-11 DIAGNOSIS — G2581 Restless legs syndrome: Secondary | ICD-10-CM | POA: Diagnosis not present

## 2020-05-11 DIAGNOSIS — I10 Essential (primary) hypertension: Secondary | ICD-10-CM

## 2020-05-11 LAB — POCT INR: INR: 2.4 (ref 2.0–3.0)

## 2020-05-11 NOTE — Progress Notes (Signed)
Established Patient Office Visit  Subjective:  Patient ID: Kyle Santos, male    DOB: 09-06-1935  Age: 84 y.o. MRN: [id number]  CC:  Chief Complaint  Patient presents with  . Atrial Fibrillation    Atrial Fibrillation Presents for follow-up visit. Symptoms include hypertension and weakness. Symptoms are negative for an AICD problem, bradycardia, chest pain, dizziness, hemodynamic instability, palpitations, shortness of breath and syncope. The symptoms have been improving. Past medical history includes atrial fibrillation.    Raynelle Chary presents for protime  check  Past Medical History:  Diagnosis Date  . A-fib ([place])   . Arthritis   . BPH (benign prostatic hyperplasia)   . Depression   . Heart murmur   . HOH (hard of hearing)   . Hypercholesteremia   . Hypertension   . Loss of equilibrium    being evaluated at this time.  . Pneumonia    history of  . Sleep apnea     Past Surgical History:  Procedure Laterality Date  . CATARACT EXTRACTION W/PHACO Right 01/09/2017   Procedure: CATARACT EXTRACTION PHACO AND INTRAOCULAR LENS PLACEMENT (IOC);  Surgeon: Birder Robson, MD;  Location: [facility] ORS;  Service: Ophthalmology;  Laterality: Right;  [place] 01:06 AP% 19.7 CDE 13.18 Fluid pack lot # [id number]  . CATARACT EXTRACTION W/PHACO Left 08/07/2017   Procedure: CATARACT EXTRACTION PHACO AND INTRAOCULAR LENS PLACEMENT (IOC)-LEFT;  Surgeon: Birder Robson, MD;  Location: [facility] ORS;  Service: Ophthalmology;  Laterality: Left;  [place]  00:43 AP% 15.0 CDE 6.50 Fluid pack lot # [id number]  . COLONOSCOPY    . JOINT REPLACEMENT     knee  . JOINT REPLACEMENT     shoulder    Family History  Problem Relation Age of Onset  . Heart disease Father     Social History   Socioeconomic History  . Marital status: Married    Spouse name: Not on file  . Number of children: Not on file  . Years of education: Not on file  . Highest education level: Not on file  Occupational History  .  Not on file  Tobacco Use  . Smoking status: Former Smoker    Quit date: 08/22/1943    Years since quitting: 76.7  . Smokeless tobacco: Former Network engineer and Sexual Activity  . Alcohol use: Yes    Alcohol/week: 0.0 standard drinks    Comment: occasional  . Drug use: No  . Sexual activity: Not Currently    Birth control/protection: None  Other Topics Concern  . Not on file  Social History Narrative   Drinks about 2 cups of caffeine.   Social Determinants of Health   Financial Resource Strain:   . Difficulty of Paying Living Expenses: Not on file  Food Insecurity:   . Worried About Charity fundraiser in the Last Year: Not on file  . Ran Out of Food in the Last Year: Not on file  Transportation Needs:   . Lack of Transportation (Medical): Not on file  . Lack of Transportation (Non-Medical): Not on file  Physical Activity:   . Days of Exercise per Week: Not on file  . Minutes of Exercise per Session: Not on file  Stress:   . Feeling of Stress : Not on file  Social Connections:   . Frequency of Communication with Friends and Family: Not on file  . Frequency of Social Gatherings with Friends and Family: Not on file  . Attends Religious Services: Not on file  .  Active Member of Clubs or Organizations: Not on file  . Attends Archivist Meetings: Not on file  . Marital Status: Not on file  Intimate Partner Violence:   . Fear of Current or Ex-Partner: Not on file  . Emotionally Abused: Not on file  . Physically Abused: Not on file  . Sexually Abused: Not on file     Current Outpatient Medications:  .  acyclovir (ZOVIRAX) 800 MG tablet, Take 800 mg by mouth 4 (four) times daily., Disp: , Rfl:  .  ampicillin (PRINCIPEN) 500 MG capsule, Take 1 capsule (500 mg total) by mouth 4 (four) times daily., Disp: 8 capsule, Rfl: 0 .  atorvastatin (LIPITOR) 80 MG tablet, Take 80 mg by mouth daily. , Disp: , Rfl:  .  B Complex Vitamins (B COMPLEX-B12 PO), Take 1 tablet by  mouth daily., Disp: , Rfl:  .  Cascara Sagrada 450 MG CAPS, Take 450 mg by mouth 2 (two) times daily., Disp: , Rfl:  .  Cholecalciferol (VITAMIN D3) 1000 units CAPS, Take 1,000 Units by mouth daily at 3 pm., Disp: , Rfl:  .  ibuprofen (ADVIL,MOTRIN) 200 MG tablet, Take 400 mg by mouth every 6 (six) hours as needed for headache or moderate pain., Disp: , Rfl:  .  Melatonin 5 MG TABS, Take 5 mg by mouth at bedtime. , Disp: , Rfl:  .  tamsulosin (FLOMAX) 0.4 MG CAPS capsule, Take 0.4 mg by mouth daily. , Disp: , Rfl:  .  traZODone (DESYREL) 50 MG tablet, Take 25 mg by mouth at bedtime., Disp: , Rfl:  .  warfarin (COUMADIN) 7.5 MG tablet, Take 7.5 mg by mouth daily in the morning, Disp: , Rfl:    No Known Allergies  ROS Review of Systems  Constitutional: Negative.   HENT: Negative.   Eyes: Negative.   Respiratory: Negative.  Negative for shortness of breath.   Cardiovascular: Negative.  Negative for chest pain, palpitations and syncope.  Gastrointestinal: Negative.  Negative for blood in stool.  Endocrine: Negative.   Genitourinary: Negative.  Negative for difficulty urinating and frequency.  Musculoskeletal: Positive for back pain and gait problem. Negative for myalgias and neck pain.  Skin: Negative.   Allergic/Immunologic: Positive for food allergies.  Neurological: Positive for weakness. Negative for dizziness, numbness and headaches.  Hematological: Negative.   Psychiatric/Behavioral: Negative.   All other systems reviewed and are negative.     Objective:    Physical Exam Vitals reviewed.  Constitutional:      Appearance: Normal appearance.  HENT:     Mouth/Throat:     Mouth: Mucous membranes are moist.  Eyes:     Pupils: Pupils are equal, round, and reactive to light.  Neck:     Vascular: No carotid bruit.  Cardiovascular:     Rate and Rhythm: Normal rate. Rhythm irregular.     Pulses: Normal pulses.     Heart sounds: Normal heart sounds.  Pulmonary:     Effort:  Pulmonary effort is normal.     Breath sounds: Normal breath sounds.  Abdominal:     General: Bowel sounds are normal.     Palpations: Abdomen is soft. There is no hepatomegaly, splenomegaly or mass.     Tenderness: There is no abdominal tenderness.     Hernia: No hernia is present.  Musculoskeletal:     Cervical back: Neck supple.     Right lower leg: No edema.     Left lower leg: No edema.  Skin:  Findings: No rash.  Neurological:     Mental Status: He is alert and oriented to person, place, and time.     Motor: No weakness.  Psychiatric:        Mood and Affect: Mood normal.        Behavior: Behavior normal.        Thought Content: Thought content normal.        Judgment: Judgment normal.     BP 120/74   Pulse 65   Ht 5\' 7"  (1.702 m)   Wt 162 lb 6.4 oz (73.7 kg)   BMI 25.44 kg/m  Wt Readings from Last 3 Encounters:  05/11/20 162 lb 6.4 oz (73.7 kg)  04/05/20 165 lb (74.8 kg)  03/19/20 167 lb (75.8 kg)     Health Maintenance Due  Topic Date Due  . TETANUS/TDAP  Never done  . PNA vac Low Risk Adult (1 of 2 - PCV13) Never done  . INFLUENZA VACCINE  03/21/2020    There are no preventive care reminders to display for this patient.  No results found for: TSH Lab Results  Component Value Date   WBC 8.5 07/02/2019   HGB 14.5 07/02/2019   HCT 42.2 07/02/2019   MCV 93.2 07/02/2019   PLT 163 07/02/2019   Lab Results  Component Value Date   NA 141 07/02/2019   K 3.9 07/02/2019   CO2 27 07/02/2019   GLUCOSE 94 07/02/2019   BUN 20 07/02/2019   CREATININE 1.25 (H) 07/02/2019   CALCIUM 9.2 07/02/2019   ANIONGAP 9 07/02/2019   No results found for: CHOL No results found for: HDL No results found for: LDLCALC No results found for: TRIG No results found for: CHOLHDL No results found for: HGBA1C    Assessment & Plan:   Problem List Items Addressed This Visit      Cardiovascular and Mediastinum   Atrial fibrillation ([place]) - Primary    Atrial fibrillation  is stable patient does not have any chest pain no shortness of breath on exertion no swelling of the leg pro time is therapeutic      Relevant Orders   POCT INR (Completed)   Essential hypertension    Blood pressure is stable on the present medication.  He did not have any chest pain or paroxysmal nocturnal dyspnea        Nervous and Auditory   Primary Parkinsonism [facility])    Patient parkinsonian disease is stable for the last 1 to 2 years.  He does not have any cognitive impairment.  Gait is unsteady.  Incoordination is present.        Other   Restless legs syndrome (RLS)    Restless leg syndrome is under control on Mirapex.         No orders of the defined types were placed in this encounter.   Follow-up: No follow-ups on file.    Cletis Athens, MD

## 2020-05-11 NOTE — Assessment & Plan Note (Signed)
Restless leg syndrome is under control on Mirapex.

## 2020-05-11 NOTE — Assessment & Plan Note (Signed)
Atrial fibrillation is stable patient does not have any chest pain no shortness of breath on exertion no swelling of the leg pro time is therapeutic

## 2020-05-11 NOTE — Assessment & Plan Note (Signed)
Blood pressure is stable on the present medication.  He did not have any chest pain or paroxysmal nocturnal dyspnea

## 2020-05-11 NOTE — Assessment & Plan Note (Signed)
Patient parkinsonian disease is stable for the last 1 to 2 years.  He does not have any cognitive impairment.  Gait is unsteady.  Incoordination is present.

## 2020-05-19 ENCOUNTER — Ambulatory Visit: Payer: Medicare Other | Admitting: Urology

## 2020-05-19 ENCOUNTER — Encounter: Payer: Self-pay | Admitting: Urology

## 2020-05-19 ENCOUNTER — Other Ambulatory Visit: Payer: Self-pay

## 2020-05-19 VITALS — BP 122/72 | HR 69 | Ht 67.0 in | Wt 160.0 lb

## 2020-05-19 DIAGNOSIS — N401 Enlarged prostate with lower urinary tract symptoms: Secondary | ICD-10-CM | POA: Diagnosis not present

## 2020-05-19 DIAGNOSIS — N200 Calculus of kidney: Secondary | ICD-10-CM | POA: Diagnosis not present

## 2020-05-19 DIAGNOSIS — N390 Urinary tract infection, site not specified: Secondary | ICD-10-CM

## 2020-05-19 DIAGNOSIS — N3000 Acute cystitis without hematuria: Secondary | ICD-10-CM | POA: Diagnosis not present

## 2020-05-19 DIAGNOSIS — N138 Other obstructive and reflux uropathy: Secondary | ICD-10-CM

## 2020-05-19 LAB — BLADDER SCAN AMB NON-IMAGING: Scan Result: 70

## 2020-05-19 NOTE — Progress Notes (Signed)
° °  05/19/2020 10:20 AM   Kyle Santos 16-Feb-1936 [id number]  Reason for visit: Follow up groin pain, BPH, history of urethral stricture  HPI: I saw Kyle Santos and his wife for the above issues.  Briefly I saw him in mid August 2021 when he was having left groin pain and likely spontaneously passed a kidney stone.  A CT was performed at that time that showed no hydronephrosis and a nondistended bladder.  Urine culture did grow > 100 K Enterococcus, and he was treated with culture appropriate Cipro with resolution of his groin pain and urinary symptoms.  He denies any complaints today.  He also reportedly has a history of a urethral stricture, and very rarely will catheterize if he feels he is not emptying completely.  PVR is normal today at 70 mL.  We discussed return precautions at length.  He would like to continue Flomax for his urinary symptoms, and is doing well from a urinary perspective at this point.  We discussed that recurrent UTIs, worsening urinary symptoms, retention, or gross hematuria would all prompt further evaluation with cystoscopy, and consideration of stricture dilation or an outlet procedure.  Continue Flomax RTC 1 year with IPSS/PVR, sooner if problems   [place], [place] [address] [place], [place] [address] [phone]

## 2020-05-19 NOTE — Patient Instructions (Signed)
Urinary Tract Infection, Adult  A urinary tract infection (UTI) is an infection of any part of the urinary tract. The urinary tract includes the kidneys, ureters, bladder, and urethra. These organs make, store, and get rid of urine in the body. Your health care provider may use other names to describe the infection. An upper UTI affects the ureters and kidneys (pyelonephritis). A lower UTI affects the bladder (cystitis) and urethra (urethritis). What are the causes? Most urinary tract infections are caused by bacteria in your genital area, around the entrance to your urinary tract (urethra). These bacteria grow and cause inflammation of your urinary tract. What increases the risk? You are more likely to develop this condition if:  You have a urinary catheter that stays in place (indwelling).  You are not able to control when you urinate or have a bowel movement (you have incontinence).  You are male and you: ? Use a spermicide or diaphragm for birth control. ? Have low estrogen levels. ? Are pregnant.  You have certain genes that increase your risk (genetics).  You are sexually active.  You take antibiotic medicines.  You have a condition that causes your flow of urine to slow down, such as: ? An enlarged prostate, if you are male. ? Blockage in your urethra (stricture). ? A kidney stone. ? A nerve condition that affects your bladder control (neurogenic bladder). ? Not getting enough to drink, or not urinating often.  You have certain medical conditions, such as: ? Diabetes. ? A weak disease-fighting system (immunesystem). ? Sickle cell disease. ? Gout. ? Spinal cord injury. What are the signs or symptoms? Symptoms of this condition include:  Needing to urinate right away (urgently).  Frequent urination or passing small amounts of urine frequently.  Pain or burning with urination.  Blood in the urine.  Urine that smells bad or unusual.  Trouble urinating.  Cloudy  urine.  Vaginal discharge, if you are male.  Pain in the abdomen or the lower back. You may also have:  Vomiting or a decreased appetite.  Confusion.  Irritability or tiredness.  A fever.  Diarrhea. The first symptom in older adults may be confusion. In some cases, they may not have any symptoms until the infection has worsened. How is this diagnosed? This condition is diagnosed based on your medical history and a physical exam. You may also have other tests, including:  Urine tests.  Blood tests.  Tests for sexually transmitted infections (STIs). If you have had more than one UTI, a cystoscopy or imaging studies may be done to determine the cause of the infections. How is this treated? Treatment for this condition includes:  Antibiotic medicine.  Over-the-counter medicines to treat discomfort.  Drinking enough water to stay hydrated. If you have frequent infections or have other conditions such as a kidney stone, you may need to see a health care provider who specializes in the urinary tract (urologist). In rare cases, urinary tract infections can cause sepsis. Sepsis is a life-threatening condition that occurs when the body responds to an infection. Sepsis is treated in the hospital with IV antibiotics, fluids, and other medicines. Follow these instructions at home:  Medicines  Take over-the-counter and prescription medicines only as told by your health care provider.  If you were prescribed an antibiotic medicine, take it as told by your health care provider. Do not stop using the antibiotic even if you start to feel better. General instructions  Make sure you: ? Empty your bladder often and   completely. Do not hold urine for long periods of time. ? Empty your bladder after sex. ? Wipe from front to back after a bowel movement if you are male. Use each tissue one time when you wipe.  Drink enough fluid to keep your urine pale yellow.  Keep all follow-up  visits as told by your health care provider. This is important. Contact a health care provider if:  Your symptoms do not get better after 1-2 days.  Your symptoms go away and then return. Get help right away if you have:  Severe pain in your back or your lower abdomen.  A fever.  Nausea or vomiting. Summary  A urinary tract infection (UTI) is an infection of any part of the urinary tract, which includes the kidneys, ureters, bladder, and urethra.  Most urinary tract infections are caused by bacteria in your genital area, around the entrance to your urinary tract (urethra).  Treatment for this condition often includes antibiotic medicines.  If you were prescribed an antibiotic medicine, take it as told by your health care provider. Do not stop using the antibiotic even if you start to feel better.  Keep all follow-up visits as told by your health care provider. This is important. This information is not intended to replace advice given to you by your health care provider. Make sure you discuss any questions you have with your health care provider. Document Revised: 07/25/2018 Document Reviewed: 02/14/2018 Elsevier Patient Education  [employer]. Benign Prostatic Hyperplasia  Benign prostatic hyperplasia (BPH) is an enlarged prostate gland that is caused by the normal aging process and not by cancer. The prostate is a walnut-sized gland that is involved in the production of semen. It is located in front of the rectum and below the bladder. The bladder stores urine and the urethra is the tube that carries the urine out of the body. The prostate may get bigger as a man gets older. An enlarged prostate can press on the urethra. This can make it harder to pass urine. The build-up of urine in the bladder can cause infection. Back pressure and infection may progress to bladder damage and kidney (renal) failure. What are the causes? This condition is part of a normal aging process.  However, not all men develop problems from this condition. If the prostate enlarges away from the urethra, urine flow will not be blocked. If it enlarges toward the urethra and compresses it, there will be problems passing urine. What increases the risk? This condition is more likely to develop in men over the age of 44 years. What are the signs or symptoms? Symptoms of this condition include:  Getting up often during the night to urinate.  Needing to urinate frequently during the day.  Difficulty starting urine flow.  Decrease in size and strength of your urine stream.  Leaking (dribbling) after urinating.  Inability to pass urine. This needs immediate treatment.  Inability to completely empty your bladder.  Pain when you pass urine. This is more common if there is also an infection.  Urinary tract infection (UTI). How is this diagnosed? This condition is diagnosed based on your medical history, a physical exam, and your symptoms. Tests will also be done, such as:  A post-void bladder scan. This measures any amount of urine that may remain in your bladder after you finish urinating.  A digital rectal exam. In a rectal exam, your health care provider checks your prostate by putting a lubricated, gloved finger into your rectum  to feel the back of your prostate gland. This exam detects the size of your gland and any abnormal lumps or growths.  An exam of your urine (urinalysis).  A prostate specific antigen (PSA) screening. This is a blood test used to screen for prostate cancer.  An ultrasound. This test uses sound waves to electronically produce a picture of your prostate gland. Your health care provider may refer you to a specialist in kidney and prostate diseases (urologist). How is this treated? Once symptoms begin, your health care provider will monitor your condition (active surveillance or watchful waiting). Treatment for this condition will depend on the severity of your  condition. Treatment may include:  Observation and yearly exams. This may be the only treatment needed if your condition and symptoms are mild.  Medicines to relieve your symptoms, including: ? Medicines to shrink the prostate. ? Medicines to relax the muscle of the prostate.  Surgery in severe cases. Surgery may include: ? Prostatectomy. In this procedure, the prostate tissue is removed completely through an open incision or with a laparoscope or robotics. ? Transurethral resection of the prostate (TURP). In this procedure, a tool is inserted through the opening at the tip of the penis (urethra). It is used to cut away tissue of the inner core of the prostate. The pieces are removed through the same opening of the penis. This removes the blockage. ? Transurethral incision (TUIP). In this procedure, small cuts are made in the prostate. This lessens the prostate's pressure on the urethra. ? Transurethral microwave thermotherapy (TUMT). This procedure uses microwaves to create heat. The heat destroys and removes a small amount of prostate tissue. ? Transurethral needle ablation (TUNA). This procedure uses radio frequencies to destroy and remove a small amount of prostate tissue. ? Interstitial laser coagulation ([employer]). This procedure uses a laser to destroy and remove a small amount of prostate tissue. ? Transurethral electrovaporization (TUVP). This procedure uses electrodes to destroy and remove a small amount of prostate tissue. ? Prostatic urethral lift. This procedure inserts an implant to push the lobes of the prostate away from the urethra. Follow these instructions at home:  Take over-the-counter and prescription medicines only as told by your health care provider.  Monitor your symptoms for any changes. Contact your health care provider with any changes.  Avoid drinking large amounts of liquid before going to bed or out in public.  Avoid or reduce how much caffeine or alcohol you  drink.  Give yourself time when you urinate.  Keep all follow-up visits as told by your health care provider. This is important. Contact a health care provider if:  You have unexplained back pain.  Your symptoms do not get better with treatment.  You develop side effects from the medicine you are taking.  Your urine becomes very dark or has a bad smell.  Your lower abdomen becomes distended and you have trouble passing your urine. Get help right away if:  You have a fever or chills.  You suddenly cannot urinate.  You feel lightheaded, or very dizzy, or you faint.  There are large amounts of blood or clots in the urine.  Your urinary problems become hard to manage.  You develop moderate to severe low back or flank pain. The flank is the side of your body between the ribs and the hip. These symptoms may represent a serious problem that is an emergency. Do not wait to see if the symptoms will go away. Get medical help right away.  Call your local emergency services (911 in the [place] Do not drive yourself to the hospital. Summary  Benign prostatic hyperplasia (BPH) is an enlarged prostate that is caused by the normal aging process and not by cancer.  An enlarged prostate can press on the urethra. This can make it hard to pass urine.  This condition is part of a normal aging process and is more likely to develop in men over the age of 17 years.  Get help right away if you suddenly cannot urinate. This information is not intended to replace advice given to you by your health care provider. Make sure you discuss any questions you have with your health care provider. Document Revised: 07/02/2018 Document Reviewed: 09/11/2016 Elsevier Patient Education  2020 [employer].

## 2020-06-04 DIAGNOSIS — I340 Nonrheumatic mitral (valve) insufficiency: Secondary | ICD-10-CM | POA: Diagnosis not present

## 2020-06-04 DIAGNOSIS — E785 Hyperlipidemia, unspecified: Secondary | ICD-10-CM | POA: Diagnosis not present

## 2020-06-04 DIAGNOSIS — I2510 Atherosclerotic heart disease of native coronary artery without angina pectoris: Secondary | ICD-10-CM | POA: Diagnosis not present

## 2020-06-04 DIAGNOSIS — I10 Essential (primary) hypertension: Secondary | ICD-10-CM | POA: Diagnosis not present

## 2020-06-10 ENCOUNTER — Encounter: Payer: Self-pay | Admitting: Family Medicine

## 2020-06-10 ENCOUNTER — Ambulatory Visit (INDEPENDENT_AMBULATORY_CARE_PROVIDER_SITE_OTHER): Payer: Medicare Other | Admitting: Family Medicine

## 2020-06-10 ENCOUNTER — Other Ambulatory Visit: Payer: Self-pay

## 2020-06-10 VITALS — BP 144/79 | HR 60 | Ht 67.0 in | Wt 167.0 lb

## 2020-06-10 DIAGNOSIS — Z7901 Long term (current) use of anticoagulants: Secondary | ICD-10-CM

## 2020-06-10 DIAGNOSIS — I4891 Unspecified atrial fibrillation: Secondary | ICD-10-CM

## 2020-06-10 DIAGNOSIS — Z23 Encounter for immunization: Secondary | ICD-10-CM | POA: Diagnosis not present

## 2020-06-10 LAB — POCT INR: INR: 2.6 (ref 2.0–3.0)

## 2020-06-10 NOTE — Assessment & Plan Note (Signed)
No change in A Fib, has been following up with Dr Humphrey Rolls every two months for BP management. INR in range at 2.6 today.

## 2020-06-10 NOTE — Progress Notes (Signed)
Established Patient Office Visit  SUBJECTIVE:  Subjective  Patient ID: Kyle Santos, male    DOB: Dec 10, 1935  Age: 84 y.o. MRN: [id number]  CC:  Chief Complaint  Patient presents with   Atrial Fibrillation    patient here for coumadin mng    HPI Kyle Santos is a 85 y.o. male presenting today for INR and Flu vaccine.     Past Medical History:  Diagnosis Date   A-fib [facility])    Arthritis    BPH (benign prostatic hyperplasia)    Depression    Heart murmur    HOH (hard of hearing)    Hypercholesteremia    Hypertension    Loss of equilibrium    being evaluated at this time.   Pneumonia    history of   Sleep apnea     Past Surgical History:  Procedure Laterality Date   CATARACT EXTRACTION W/PHACO Right 01/09/2017   Procedure: CATARACT EXTRACTION PHACO AND INTRAOCULAR LENS PLACEMENT (IOC);  Surgeon: Birder Robson, MD;  Location: [facility] ORS;  Service: Ophthalmology;  Laterality: Right;  [place] 01:06 AP% 19.7 CDE 13.18 Fluid pack lot # [id number]   CATARACT EXTRACTION W/PHACO Left 08/07/2017   Procedure: CATARACT EXTRACTION PHACO AND INTRAOCULAR LENS PLACEMENT (IOC)-LEFT;  Surgeon: Birder Robson, MD;  Location: [facility] ORS;  Service: Ophthalmology;  Laterality: Left;  [place]  00:43 AP% 15.0 CDE 6.50 Fluid pack lot # [id number]   COLONOSCOPY     JOINT REPLACEMENT     knee   JOINT REPLACEMENT     shoulder    Family History  Problem Relation Age of Onset   Heart disease Father     Social History   Socioeconomic History   Marital status: Married    Spouse name: Not on file   Number of children: Not on file   Years of education: Not on file   Highest education level: Not on file  Occupational History   Not on file  Tobacco Use   Smoking status: Former Smoker    Quit date: 08/22/1943    Years since quitting: 76.8   Smokeless tobacco: Former Systems developer  Substance and Sexual Activity   Alcohol use: Yes    Alcohol/week: 0.0 standard drinks     Comment: occasional   Drug use: No   Sexual activity: Not Currently    Birth control/protection: None  Other Topics Concern   Not on file  Social History Narrative   Drinks about 2 cups of caffeine.   Social Determinants of Health   Financial Resource Strain:    Difficulty of Paying Living Expenses: Not on file  Food Insecurity:    Worried About Charity fundraiser in the Last Year: Not on file   [employer] of Food in the Last Year: Not on file  Transportation Needs:    Lack of Transportation (Medical): Not on file   Lack of Transportation (Non-Medical): Not on file  Physical Activity:    Days of Exercise per Week: Not on file   Minutes of Exercise per Session: Not on file  Stress:    Feeling of Stress : Not on file  Social Connections:    Frequency of Communication with Friends and Family: Not on file   Frequency of Social Gatherings with Friends and Family: Not on file   Attends Religious Services: Not on file   Active Member of Clubs or Organizations: Not on file   Attends Archivist Meetings: Not on file   Marital  Status: Not on file  Intimate Partner Violence:    Fear of Current or Ex-Partner: Not on file   Emotionally Abused: Not on file   Physically Abused: Not on file   Sexually Abused: Not on file     Current Outpatient Medications:    acyclovir (ZOVIRAX) 800 MG tablet, Take 800 mg by mouth 4 (four) times daily., Disp: , Rfl:    atorvastatin (LIPITOR) 80 MG tablet, Take 80 mg by mouth daily. , Disp: , Rfl:    B Complex Vitamins (B COMPLEX-B12 PO), Take 1 tablet by mouth daily., Disp: , Rfl:    [place] 450 MG CAPS, Take 450 mg by mouth 2 (two) times daily., Disp: , Rfl:    Cholecalciferol (VITAMIN D3) 1000 units CAPS, Take 1,000 Units by mouth daily at 3 pm., Disp: , Rfl:    ibuprofen (ADVIL,MOTRIN) 200 MG tablet, Take 400 mg by mouth every 6 (six) hours as needed for headache or moderate pain., Disp: , Rfl:     lisinopril (ZESTRIL) 20 MG tablet, Take 20 mg by mouth daily., Disp: , Rfl:    Melatonin 5 MG TABS, Take 5 mg by mouth at bedtime. , Disp: , Rfl:    tamsulosin (FLOMAX) 0.4 MG CAPS capsule, Take 0.4 mg by mouth daily. , Disp: , Rfl:    traZODone (DESYREL) 50 MG tablet, Take 25 mg by mouth at bedtime., Disp: , Rfl:    warfarin (COUMADIN) 7.5 MG tablet, Take 7.5 mg by mouth daily in the morning, Disp: , Rfl:    No Known Allergies  ROS Review of Systems  Constitutional: Negative.   Respiratory: Negative.   Cardiovascular: Negative.   Endocrine: Negative.   Genitourinary: Negative.      OBJECTIVE:    Physical Exam Constitutional:      Appearance: He is normal weight.  Eyes:     Pupils: Pupils are equal, round, and reactive to light.  Cardiovascular:     Rate and Rhythm: Rhythm irregular.  Pulmonary:     Effort: Pulmonary effort is normal.  Abdominal:     General: Abdomen is flat.  Musculoskeletal:        General: Normal range of motion.  Skin:    General: Skin is warm.  Neurological:     Mental Status: He is alert.     BP (!) 144/79    Pulse 60    Ht 5\' 7"  (1.702 m)    Wt 167 lb (75.8 kg)    BMI 26.16 kg/m  Wt Readings from Last 3 Encounters:  06/10/20 167 lb (75.8 kg)  05/19/20 160 lb (72.6 kg)  05/11/20 162 lb 6.4 oz (73.7 kg)    Health Maintenance Due  Topic Date Due   TETANUS/TDAP  Never done   PNA vac Low Risk Adult (1 of 2 - PCV13) Never done    There are no preventive care reminders to display for this patient.  CBC Latest Ref Rng & Units 07/02/2019 06/18/2017 10/05/2011  WBC 4.0 - 10.5 K/uL 8.5 7.1 5.8  Hemoglobin 13.0 - 17.0 g/dL 14.5 13.6 14.3  Hematocrit 39 - 52 % 42.2 39.9(L) 42.2  Platelets 150 - 400 K/uL 163 156 171   CMP Latest Ref Rng & Units 07/02/2019 06/18/2017 02/12/2017  Glucose 70 - 99 mg/dL 94 140(H) -  BUN 8 - 23 mg/dL 20 27(H) -  Creatinine 0.61 - 1.24 mg/dL 1.25(H) 1.61(H) 1.30(H)  Sodium 135 - 145 mmol/L 141 138 -  Potassium  3.5 -  5.1 mmol/L 3.9 4.1 -  Chloride 98 - 111 mmol/L 105 106 -  CO2 22 - 32 mmol/L 27 24 -  Calcium 8.9 - 10.3 mg/dL 9.2 9.1 -    No results found for: TSH Lab Results  Component Value Date   ANIONGAP 9 07/02/2019   No results found for: CHOL, HDL, LDLCALC, CHOLHDL No results found for: TRIG No results found for: HGBA1C    ASSESSMENT & PLAN:   Problem List Items Addressed This Visit      Cardiovascular and Mediastinum   Atrial fibrillation ([place]) - Primary    No change in A Fib, has been following up with Dr Humphrey Rolls every two months for BP management. INR in range at 2.6 today.       Relevant Medications   lisinopril (ZESTRIL) 20 MG tablet   Other Relevant Orders   POCT INR (Completed)     Other   Encounter for current long-term use of anticoagulants    INR within range today, continue diet and medication regimen.       Need for influenza vaccination    Vaccine given today      Relevant Orders   Flu Vaccine QUAD High Dose(Fluad) (Completed)      No orders of the defined types were placed in this encounter.     Follow-up: No follow-ups on file.    Beckie Salts, [place] [address] [place], [place] [address]   By signing my name below, I, , attest that this documentation has been prepared under the direction and in the presence of Beckie Salts, FNP Electronically Signed: Beckie Salts, [place] 06/10/20, 10:36 AM

## 2020-06-10 NOTE — Assessment & Plan Note (Signed)
Vaccine given today.

## 2020-06-10 NOTE — Assessment & Plan Note (Signed)
INR within range today, continue diet and medication regimen.

## 2020-07-05 DIAGNOSIS — R0602 Shortness of breath: Secondary | ICD-10-CM | POA: Diagnosis not present

## 2020-07-05 DIAGNOSIS — I10 Essential (primary) hypertension: Secondary | ICD-10-CM | POA: Diagnosis not present

## 2020-07-05 DIAGNOSIS — I351 Nonrheumatic aortic (valve) insufficiency: Secondary | ICD-10-CM | POA: Diagnosis not present

## 2020-07-08 ENCOUNTER — Other Ambulatory Visit: Payer: Self-pay | Admitting: Internal Medicine

## 2020-07-12 ENCOUNTER — Ambulatory Visit: Payer: Medicare Other | Admitting: Internal Medicine

## 2020-07-13 ENCOUNTER — Other Ambulatory Visit: Payer: Self-pay

## 2020-07-13 ENCOUNTER — Encounter: Payer: Self-pay | Admitting: Internal Medicine

## 2020-07-13 ENCOUNTER — Ambulatory Visit (INDEPENDENT_AMBULATORY_CARE_PROVIDER_SITE_OTHER): Payer: Medicare Other | Admitting: Internal Medicine

## 2020-07-13 VITALS — BP 133/73 | HR 78 | Ht 67.0 in | Wt 168.2 lb

## 2020-07-13 DIAGNOSIS — G4733 Obstructive sleep apnea (adult) (pediatric): Secondary | ICD-10-CM

## 2020-07-13 DIAGNOSIS — Z9989 Dependence on other enabling machines and devices: Secondary | ICD-10-CM | POA: Diagnosis not present

## 2020-07-13 DIAGNOSIS — Z20822 Contact with and (suspected) exposure to covid-19: Secondary | ICD-10-CM | POA: Diagnosis not present

## 2020-07-13 DIAGNOSIS — I10 Essential (primary) hypertension: Secondary | ICD-10-CM | POA: Diagnosis not present

## 2020-07-13 DIAGNOSIS — I4891 Unspecified atrial fibrillation: Secondary | ICD-10-CM | POA: Diagnosis not present

## 2020-07-13 LAB — POCT INR: INR: 2.3 (ref 2.0–3.0)

## 2020-07-13 LAB — POC COVID19 BINAXNOW: SARS Coronavirus 2 Ag: NEGATIVE

## 2020-07-13 MED ORDER — TAMSULOSIN HCL 0.4 MG PO CAPS
0.4000 mg | ORAL_CAPSULE | Freq: Every day | ORAL | 3 refills | Status: DC
Start: 2020-07-13 — End: 2021-06-28

## 2020-07-13 NOTE — Assessment & Plan Note (Signed)
Patient is on Coumadin for chronic atrial fibrillation.

## 2020-07-13 NOTE — Assessment & Plan Note (Signed)
Blood pressure stable on the present therapeutic regimen.

## 2020-07-13 NOTE — Assessment & Plan Note (Signed)
Patient is using CPAP machine on a regular basis

## 2020-07-13 NOTE — Progress Notes (Signed)
Established Patient Office Visit  Subjective:  Patient ID: Kyle Santos, male    DOB: 13-Jul-1936  Age: 84 y.o. MRN: [id number]  CC:  Chief Complaint  Patient presents with  . Atrial Fibrillation    HPI  Kyle Santos presents for sore throat, he denies any chest pain shortness of breath and wheezing or expectoration of mucus.  There is no abdominal pain swelling of the legs  Past Medical History:  Diagnosis Date  . A-fib ([place])   . Arthritis   . BPH (benign prostatic hyperplasia)   . Depression   . Heart murmur   . HOH (hard of hearing)   . Hypercholesteremia   . Hypertension   . Loss of equilibrium    being evaluated at this time.  . Pneumonia    history of  . Sleep apnea     Past Surgical History:  Procedure Laterality Date  . CATARACT EXTRACTION W/PHACO Right 01/09/2017   Procedure: CATARACT EXTRACTION PHACO AND INTRAOCULAR LENS PLACEMENT (IOC);  Surgeon: Birder Robson, MD;  Location: [facility] ORS;  Service: Ophthalmology;  Laterality: Right;  [place] 01:06 AP% 19.7 CDE 13.18 Fluid pack lot # [id number]  . CATARACT EXTRACTION W/PHACO Left 08/07/2017   Procedure: CATARACT EXTRACTION PHACO AND INTRAOCULAR LENS PLACEMENT (IOC)-LEFT;  Surgeon: Birder Robson, MD;  Location: [facility]S;  Service: Ophthalmology;  Laterality: Left;  [place]  00:43 AP% 15.0 CDE 6.50 Fluid pack lot # [id number]  . COLONOSCOPY    . JOINT REPLACEMENT     knee  . JOINT REPLACEMENT     shoulder    Family History  Problem Relation Age of Onset  . Heart disease Father     Social History   Socioeconomic History  . Marital status: Married    Spouse name: Not on file  . Number of children: Not on file  . Years of education: Not on file  . Highest education level: Not on file  Occupational History  . Not on file  Tobacco Use  . Smoking status: Former Smoker    Quit date: 08/22/1943    Years since quitting: 76.9  . Smokeless tobacco: Former Network engineer and Sexual Activity  . Alcohol  use: Yes    Alcohol/week: 0.0 standard drinks    Comment: occasional  . Drug use: No  . Sexual activity: Not Currently    Birth control/protection: None  Other Topics Concern  . Not on file  Social History Narrative   Drinks about 2 cups of caffeine.   Social Determinants of Health   Financial Resource Strain:   . Difficulty of Paying Living Expenses: Not on file  Food Insecurity:   . Worried About Charity fundraiser in the Last Year: Not on file  . Ran Out of Food in the Last Year: Not on file  Transportation Needs:   . Lack of Transportation (Medical): Not on file  . Lack of Transportation (Non-Medical): Not on file  Physical Activity:   . Days of Exercise per Week: Not on file  . Minutes of Exercise per Session: Not on file  Stress:   . Feeling of Stress : Not on file  Social Connections:   . Frequency of Communication with Friends and Family: Not on file  . Frequency of Social Gatherings with Friends and Family: Not on file  . Attends Religious Services: Not on file  . Active Member of Clubs or Organizations: Not on file  . Attends Archivist Meetings: Not on  file  . Marital Status: Not on file  Intimate Partner Violence:   . Fear of Current or Ex-Partner: Not on file  . Emotionally Abused: Not on file  . Physically Abused: Not on file  . Sexually Abused: Not on file     Current Outpatient Medications:  .  acyclovir (ZOVIRAX) 800 MG tablet, Take 800 mg by mouth 4 (four) times daily., Disp: , Rfl:  .  atorvastatin (LIPITOR) 80 MG tablet, Take 80 mg by mouth daily. , Disp: , Rfl:  .  B Complex Vitamins (B COMPLEX-B12 PO), Take 1 tablet by mouth daily., Disp: , Rfl:  .  Cascara [place] 450 MG CAPS, Take 450 mg by mouth 2 (two) times daily., Disp: , Rfl:  .  Cholecalciferol (VITAMIN D3) 1000 units CAPS, Take 1,000 Units by mouth daily at 3 pm., Disp: , Rfl:  .  ibuprofen (ADVIL,MOTRIN) 200 MG tablet, Take 400 mg by mouth every 6 (six) hours as needed for  headache or moderate pain., Disp: , Rfl:  .  lisinopril (ZESTRIL) 20 MG tablet, Take 20 mg by mouth daily., Disp: , Rfl:  .  Melatonin 5 MG TABS, Take 5 mg by mouth at bedtime. , Disp: , Rfl:  .  tamsulosin (FLOMAX) 0.4 MG CAPS capsule, TAKE 1 CAPSULE BY MOUTH  DAILY, Disp: 90 capsule, Rfl: 3 .  traZODone (DESYREL) 50 MG tablet, Take 25 mg by mouth at bedtime., Disp: , Rfl:  .  warfarin (COUMADIN) 7.5 MG tablet, Take 7.5 mg by mouth daily in the morning, Disp: , Rfl:    No Known Allergies  ROS Review of Systems    Objective:    Physical Exam  BP 133/73   Pulse 78   Ht 5\' 7"  (1.702 m)   Wt 168 lb 3.2 oz (76.3 kg)   BMI 26.34 kg/m  Wt Readings from Last 3 Encounters:  07/13/20 168 lb 3.2 oz (76.3 kg)  06/10/20 167 lb (75.8 kg)  05/19/20 160 lb (72.6 kg)     Health Maintenance Due  Topic Date Due  . TETANUS/TDAP  Never done  . PNA vac Low Risk Adult (1 of 2 - PCV13) Never done    There are no preventive care reminders to display for this patient.  No results found for: TSH Lab Results  Component Value Date   WBC 8.5 07/02/2019   HGB 14.5 07/02/2019   HCT 42.2 07/02/2019   MCV 93.2 07/02/2019   PLT 163 07/02/2019   Lab Results  Component Value Date   NA 141 07/02/2019   K 3.9 07/02/2019   CO2 27 07/02/2019   GLUCOSE 94 07/02/2019   BUN 20 07/02/2019   CREATININE 1.25 (H) 07/02/2019   CALCIUM 9.2 07/02/2019   ANIONGAP 9 07/02/2019   No results found for: CHOL No results found for: HDL No results found for: LDLCALC No results found for: TRIG No results found for: CHOLHDL No results found for: HGBA1C    Assessment & Plan:   Problem List Items Addressed This Visit      Cardiovascular and Mediastinum   Atrial fibrillation ([place]) - Primary    Patient is on Coumadin for chronic atrial fibrillation.      Relevant Orders   POCT INR (Completed)   Essential hypertension    Blood pressure stable on the present therapeutic regimen.        Respiratory    OSA on CPAP    Patient is using CPAP machine on a regular basis  Other   Suspected COVID-19 virus infection    Patient has chest.  Cold without any fatigue, his Covid test was negative.  No nausea vomiting.      Relevant Orders   POC COVID-19 (Completed)      No orders of the defined types were placed in this encounter.   Follow-up: No follow-ups on file.    Cletis Athens, MD

## 2020-07-13 NOTE — Assessment & Plan Note (Addendum)
Patient has chest.  Cold without any fatigue, his Covid test was negative.  No nausea vomiting.

## 2020-07-13 NOTE — Addendum Note (Signed)
Addended by: Alois Cliche on: 07/13/2020 03:25 PM   Modules accepted: Orders

## 2020-07-27 ENCOUNTER — Other Ambulatory Visit: Payer: Self-pay | Admitting: Internal Medicine

## 2020-08-23 ENCOUNTER — Ambulatory Visit: Payer: Medicare Other | Admitting: Internal Medicine

## 2020-08-25 ENCOUNTER — Ambulatory Visit (INDEPENDENT_AMBULATORY_CARE_PROVIDER_SITE_OTHER): Payer: Medicare Other | Admitting: Internal Medicine

## 2020-08-25 ENCOUNTER — Other Ambulatory Visit: Payer: Self-pay

## 2020-08-25 ENCOUNTER — Encounter: Payer: Self-pay | Admitting: Internal Medicine

## 2020-08-25 VITALS — BP 137/87 | HR 63 | Ht 67.0 in | Wt 167.0 lb

## 2020-08-25 DIAGNOSIS — I4891 Unspecified atrial fibrillation: Secondary | ICD-10-CM

## 2020-08-25 DIAGNOSIS — Z0000 Encounter for general adult medical examination without abnormal findings: Secondary | ICD-10-CM

## 2020-08-25 DIAGNOSIS — N200 Calculus of kidney: Secondary | ICD-10-CM

## 2020-08-25 DIAGNOSIS — Z7901 Long term (current) use of anticoagulants: Secondary | ICD-10-CM

## 2020-08-25 DIAGNOSIS — G250 Essential tremor: Secondary | ICD-10-CM

## 2020-08-25 LAB — POCT INR: INR: 1.9 — AB (ref 2.0–3.0)

## 2020-08-25 NOTE — Assessment & Plan Note (Signed)
Patient did not have any episode of renal colic.  Abdominal soft, there is no dysuria chest is clear

## 2020-08-25 NOTE — Progress Notes (Signed)
Established Patient Office Visit  Subjective:  Patient ID: Kyle Santos, male    DOB: 1935/11/15  Age: 85 y.o. MRN: [id number]  CC:  Chief Complaint  Patient presents with  . Atrial Fibrillation    Patient is here for his INR monthly check    HPI  Kyle Santos presents for anticoagulation visit, patient denies any depression he follows his diet.  Blood pressure is under control.  With some loss of equilibrium due to parkinsonian disease.  Sleep apnea is under control.  Past Medical History:  Diagnosis Date  . A-fib ([facility])   . Arthritis   . BPH (benign prostatic hyperplasia)   . Depression   . Heart murmur   . HOH (hard of hearing)   . Hypercholesteremia   . Hypertension   . Loss of equilibrium    being evaluated at this time.  . Pneumonia    history of  . Sleep apnea     Past Surgical History:  Procedure Laterality Date  . CATARACT EXTRACTION W/PHACO Right 01/09/2017   Procedure: CATARACT EXTRACTION PHACO AND INTRAOCULAR LENS PLACEMENT (IOC);  Surgeon: Galen Manila, MD;  Location: [facility];  Service: Ophthalmology;  Laterality: Right;  [place] 01:06 AP% 19.7 CDE 13.18 Fluid pack lot # [id number]  . CATARACT EXTRACTION W/PHACO Left 08/07/2017   Procedure: CATARACT EXTRACTION PHACO AND INTRAOCULAR LENS PLACEMENT (IOC)-LEFT;  Surgeon: Galen Manila, MD;  Location: [facility];  Service: Ophthalmology;  Laterality: Left;  [place]  00:43 AP% 15.0 CDE 6.50 Fluid pack lot # [id number]  . COLONOSCOPY    . JOINT REPLACEMENT     knee  . JOINT REPLACEMENT     shoulder    Family History  Problem Relation Age of Onset  . Heart disease Father     Social History   Socioeconomic History  . Marital status: Married    Spouse name: Not on file  . Number of children: Not on file  . Years of education: Not on file  . Highest education level: Not on file  Occupational History  . Not on file  Tobacco Use  . Smoking status: Former Smoker    Quit date: 08/22/1943    Years since  quitting: 77.0  . Smokeless tobacco: Former Engineer, water and Sexual Activity  . Alcohol use: Yes    Alcohol/week: 0.0 standard drinks    Comment: occasional  . Drug use: No  . Sexual activity: Not Currently    Birth control/protection: None  Other Topics Concern  . Not on file  Social History Narrative   Drinks about 2 cups of caffeine.   Social Determinants of Health   Financial Resource Strain: Not on file  Food Insecurity: Not on file  Transportation Needs: Not on file  Physical Activity: Not on file  Stress: Not on file  Social Connections: Not on file  Intimate Partner Violence: Not on file     Current Outpatient Medications:  .  acyclovir (ZOVIRAX) 800 MG tablet, Take 800 mg by mouth 4 (four) times daily., Disp: , Rfl:  .  atorvastatin (LIPITOR) 80 MG tablet, Take 80 mg by mouth daily. , Disp: , Rfl:  .  B Complex Vitamins (B COMPLEX-B12 PO), Take 1 tablet by mouth daily., Disp: , Rfl:  .  Cascara [place] 450 MG CAPS, Take 450 mg by mouth 2 (two) times daily., Disp: , Rfl:  .  Cholecalciferol (VITAMIN D3) 1000 units CAPS, Take 1,000 Units by mouth daily at 3 pm., Disp: ,  Rfl:  .  ibuprofen (ADVIL,MOTRIN) 200 MG tablet, Take 400 mg by mouth every 6 (six) hours as needed for headache or moderate pain., Disp: , Rfl:  .  lisinopril (ZESTRIL) 20 MG tablet, Take 20 mg by mouth daily., Disp: , Rfl:  .  Melatonin 5 MG TABS, Take 5 mg by mouth at bedtime. , Disp: , Rfl:  .  tamsulosin (FLOMAX) 0.4 MG CAPS capsule, Take 1 capsule (0.4 mg total) by mouth daily., Disp: 90 capsule, Rfl: 3 .  traZODone (DESYREL) 50 MG tablet, Take 25 mg by mouth at bedtime., Disp: , Rfl:  .  warfarin (COUMADIN) 7.5 MG tablet, TAKE 1 TABLET BY MOUTH  DAILY, Disp: 90 tablet, Rfl: 3   No Known Allergies  ROS Review of Systems  Constitutional: Negative.   HENT: Negative.   Eyes: Negative.   Respiratory: Negative.   Cardiovascular: Negative.   Gastrointestinal: Negative.   Endocrine: Negative.    Genitourinary: Negative.   Musculoskeletal: Negative.   Skin: Negative.   Allergic/Immunologic: Negative.   Neurological: Negative.   Hematological: Negative.   Psychiatric/Behavioral: Negative.   All other systems reviewed and are negative.     Objective:    Physical Exam Vitals reviewed.  Constitutional:      Appearance: Normal appearance.  HENT:     Mouth/Throat:     Mouth: Mucous membranes are moist.  Eyes:     Pupils: Pupils are equal, round, and reactive to light.  Neck:     Vascular: No carotid bruit.  Cardiovascular:     Rate and Rhythm: Normal rate and regular rhythm.     Pulses: Normal pulses.     Heart sounds: Normal heart sounds.  Pulmonary:     Effort: Pulmonary effort is normal.     Breath sounds: Normal breath sounds.  Abdominal:     General: Bowel sounds are normal.     Palpations: Abdomen is soft. There is no hepatomegaly, splenomegaly or mass.     Tenderness: There is no abdominal tenderness.     Hernia: No hernia is present.  Musculoskeletal:        General: No swelling or tenderness.     Cervical back: Neck supple.     Right lower leg: No edema.     Left lower leg: No edema.  Skin:    Coloration: Skin is not jaundiced.     Findings: No rash.  Neurological:     General: No focal deficit present.     Mental Status: He is alert and oriented to person, place, and time. Mental status is at baseline.     Motor: No weakness.     Gait: Gait abnormal.  Psychiatric:        Mood and Affect: Mood normal.        Behavior: Behavior normal.     BP 137/87   Pulse 63   Ht 5\' 7"  (1.702 m)   Wt 167 lb (75.8 kg)   BMI 26.16 kg/m  Wt Readings from Last 3 Encounters:  08/25/20 167 lb (75.8 kg)  07/13/20 168 lb 3.2 oz (76.3 kg)  06/10/20 167 lb (75.8 kg)     Health Maintenance Due  Topic Date Due  . TETANUS/TDAP  Never done  . PNA vac Low Risk Adult (1 of 2 - PCV13) Never done  . COVID-19 Vaccine (3 - Booster for Moderna series) 04/12/2020     There are no preventive care reminders to display for this patient.  No results found  for: TSH Lab Results  Component Value Date   WBC 8.5 07/02/2019   HGB 14.5 07/02/2019   HCT 42.2 07/02/2019   MCV 93.2 07/02/2019   PLT 163 07/02/2019   Lab Results  Component Value Date   NA 141 07/02/2019   K 3.9 07/02/2019   CO2 27 07/02/2019   GLUCOSE 94 07/02/2019   BUN 20 07/02/2019   CREATININE 1.25 (H) 07/02/2019   CALCIUM 9.2 07/02/2019   ANIONGAP 9 07/02/2019   No results found for: CHOL No results found for: HDL No results found for: LDLCALC No results found for: TRIG No results found for: CHOLHDL No results found for: HGBA1C    Assessment & Plan:   Problem List Items Addressed This Visit      Cardiovascular and Mediastinum   Atrial fibrillation ([place]) - Primary   Relevant Orders   POCT INR (Completed)     Nervous and Auditory   Tremor, essential    Patient has tremors of both hands.  He also has an unsteady gait.  Symptoms are suggestive of parkinsonian syndrome.        Genitourinary   Kidney stone    Patient did not have any episode of renal colic.  Abdominal soft, there is no dysuria chest is clear        Other   Encounter for current long-term use of anticoagulants    Patient has chronic atrial fibrillation.  And he is taking Coumadin pro time is therapeutic.  He denies any chest pain chest is clear, is no pedal edema, gait is unsteady.         No orders of the defined types were placed in this encounter.   Follow-up: No follow-ups on file.    Cletis Athens, MD

## 2020-08-25 NOTE — Assessment & Plan Note (Signed)
Patient has chronic atrial fibrillation.  And he is taking Coumadin pro time is therapeutic.  He denies any chest pain chest is clear, is no pedal edema, gait is unsteady.

## 2020-08-25 NOTE — Assessment & Plan Note (Signed)
Patient has tremors of both hands.  He also has an unsteady gait.  Symptoms are suggestive of parkinsonian syndrome.

## 2020-08-26 LAB — CBC WITH DIFFERENTIAL/PLATELET
Absolute Monocytes: 660 cells/uL (ref 200–950)
Basophils Absolute: 49 cells/uL (ref 0–200)
Basophils Relative: 0.5 %
Eosinophils Absolute: 204 cells/uL (ref 15–500)
Eosinophils Relative: 2.1 %
HCT: 41.1 % (ref 38.5–50.0)
Hemoglobin: 14 g/dL (ref 13.2–17.1)
Lymphs Abs: 1765 cells/uL (ref 850–3900)
MCH: 31.2 pg (ref 27.0–33.0)
MCHC: 34.1 g/dL (ref 32.0–36.0)
MCV: 91.5 fL (ref 80.0–100.0)
MPV: 10.2 fL (ref 7.5–12.5)
Monocytes Relative: 6.8 %
Neutro Abs: 7023 cells/uL (ref 1500–7800)
Neutrophils Relative %: 72.4 %
Platelets: 175 10*3/uL (ref 140–400)
RBC: 4.49 10*6/uL (ref 4.20–5.80)
RDW: 12.3 % (ref 11.0–15.0)
Total Lymphocyte: 18.2 %
WBC: 9.7 10*3/uL (ref 3.8–10.8)

## 2020-08-26 LAB — HEMOGLOBIN A1C
Hgb A1c MFr Bld: 6.5 % of total Hgb — ABNORMAL HIGH (ref <5.7)
Mean Plasma Glucose: 140 mg/dL
eAG (mmol/L): 7.7 mmol/L

## 2020-08-26 LAB — COMPLETE METABOLIC PANEL WITH GFR
AG Ratio: 1.3 (calc) (ref 1.0–2.5)
ALT: 25 U/L (ref 9–46)
AST: 25 U/L (ref 10–35)
Albumin: 4.1 g/dL (ref 3.6–5.1)
Alkaline phosphatase (APISO): 84 U/L (ref 35–144)
BUN/Creatinine Ratio: 15 (calc) (ref 6–22)
BUN: 18 mg/dL (ref 7–25)
CO2: 26 mmol/L (ref 20–32)
Calcium: 9.9 mg/dL (ref 8.6–10.3)
Chloride: 106 mmol/L (ref 98–110)
Creat: 1.21 mg/dL — ABNORMAL HIGH (ref 0.70–1.11)
GFR, Est African American: 64 mL/min/{1.73_m2} (ref >=60)
GFR, Est Non African American: 55 mL/min/{1.73_m2} — ABNORMAL LOW (ref >=60)
Globulin: 3.1 g/dL (calc) (ref 1.9–3.7)
Glucose, Bld: 98 mg/dL (ref 65–99)
Potassium: 4.6 mmol/L (ref 3.5–5.3)
Sodium: 142 mmol/L (ref 135–146)
Total Bilirubin: 0.9 mg/dL (ref 0.2–1.2)
Total Protein: 7.2 g/dL (ref 6.1–8.1)

## 2020-08-26 LAB — PSA: PSA: 0.68 ng/mL (ref <=4.0)

## 2020-08-26 LAB — TSH: TSH: 1.77 mIU/L (ref 0.40–4.50)

## 2020-09-27 ENCOUNTER — Ambulatory Visit: Payer: Medicare Other | Admitting: Internal Medicine

## 2020-09-28 ENCOUNTER — Other Ambulatory Visit: Payer: Self-pay

## 2020-09-28 ENCOUNTER — Encounter: Payer: Self-pay | Admitting: Internal Medicine

## 2020-09-28 ENCOUNTER — Ambulatory Visit (INDEPENDENT_AMBULATORY_CARE_PROVIDER_SITE_OTHER): Payer: Medicare Other | Admitting: Internal Medicine

## 2020-09-28 VITALS — BP 138/82 | HR 84 | Ht 67.0 in | Wt 167.0 lb

## 2020-09-28 DIAGNOSIS — G4733 Obstructive sleep apnea (adult) (pediatric): Secondary | ICD-10-CM

## 2020-09-28 DIAGNOSIS — Z20822 Contact with and (suspected) exposure to covid-19: Secondary | ICD-10-CM | POA: Diagnosis not present

## 2020-09-28 DIAGNOSIS — Z9989 Dependence on other enabling machines and devices: Secondary | ICD-10-CM

## 2020-09-28 DIAGNOSIS — I4891 Unspecified atrial fibrillation: Secondary | ICD-10-CM

## 2020-09-28 DIAGNOSIS — G2581 Restless legs syndrome: Secondary | ICD-10-CM

## 2020-09-28 LAB — POCT INR: INR: 2.2 (ref 2.0–3.0)

## 2020-09-28 NOTE — Assessment & Plan Note (Signed)
Obstructive sleep apnea is stable.

## 2020-09-28 NOTE — Assessment & Plan Note (Signed)
Atrial fibrillation is under control

## 2020-09-28 NOTE — Assessment & Plan Note (Signed)
Patient does not have any signs symptom of Covid infection.  He has received his Covid vaccination.

## 2020-09-28 NOTE — Progress Notes (Signed)
Established Patient Office Visit  Subjective:  Patient ID: Kyle Santos, male    DOB: Jun 03, 1936  Age: 85 y.o. MRN: [id number]  CC:  Chief Complaint  Patient presents with  . Atrial Fibrillation    HPI  [name] presents for general checkup and pro time checkup.  Patient is known to have chronic atrial fibrillation.  He denies any chest pain or shortness of breath or paroxysmal nocturnal dyspnea.  His gait is slightly unsteady.  Past Medical History:  Diagnosis Date  . A-fib ([place])   . Arthritis   . BPH (benign prostatic hyperplasia)   . Depression   . Heart murmur   . HOH (hard of hearing)   . Hypercholesteremia   . Hypertension   . Loss of equilibrium    being evaluated at this time.  . Pneumonia    history of  . Sleep apnea     Past Surgical History:  Procedure Laterality Date  . CATARACT EXTRACTION W/PHACO Right 01/09/2017   Procedure: CATARACT EXTRACTION PHACO AND INTRAOCULAR LENS PLACEMENT (IOC);  Surgeon: Birder Robson, MD;  Location: [facility];  Service: Ophthalmology;  Laterality: Right;  [place] 01:06 AP% 19.7 CDE 13.18 Fluid pack lot # [id number]  . CATARACT EXTRACTION W/PHACO Left 08/07/2017   Procedure: CATARACT EXTRACTION PHACO AND INTRAOCULAR LENS PLACEMENT (IOC)-LEFT;  Surgeon: Birder Robson, MD;  Location: [facility];  Service: Ophthalmology;  Laterality: Left;  [place]  00:43 AP% 15.0 CDE 6.50 Fluid pack lot # [id number]  . COLONOSCOPY    . JOINT REPLACEMENT     knee  . JOINT REPLACEMENT     shoulder    Family History  Problem Relation Age of Onset  . Heart disease Father     Social History   Socioeconomic History  . Marital status: Married    Spouse name: Not on file  . Number of children: Not on file  . Years of education: Not on file  . Highest education level: Not on file  Occupational History  . Not on file  Tobacco Use  . Smoking status: Former Smoker    Quit date: 08/22/1943    Years since quitting: 77.1  . Smokeless tobacco:  Former Network engineer and Sexual Activity  . Alcohol use: Yes    Alcohol/week: 0.0 standard drinks    Comment: occasional  . Drug use: No  . Sexual activity: Not Currently    Birth control/protection: None  Other Topics Concern  . Not on file  Social History Narrative   Drinks about 2 cups of caffeine.   Social Determinants of Health   Financial Resource Strain: Not on file  Food Insecurity: Not on file  Transportation Needs: Not on file  Physical Activity: Not on file  Stress: Not on file  Social Connections: Not on file  Intimate Partner Violence: Not on file     Current Outpatient Medications:  .  acyclovir (ZOVIRAX) 800 MG tablet, Take 800 mg by mouth 4 (four) times daily., Disp: , Rfl:  .  atorvastatin (LIPITOR) 80 MG tablet, Take 80 mg by mouth daily. , Disp: , Rfl:  .  B Complex Vitamins (B COMPLEX-B12 PO), Take 1 tablet by mouth daily., Disp: , Rfl:  .  Cascara [place] 450 MG CAPS, Take 450 mg by mouth 2 (two) times daily., Disp: , Rfl:  .  Cholecalciferol (VITAMIN D3) 1000 units CAPS, Take 1,000 Units by mouth daily at 3 pm., Disp: , Rfl:  .  ibuprofen (ADVIL,MOTRIN) 200  MG tablet, Take 400 mg by mouth every 6 (six) hours as needed for headache or moderate pain., Disp: , Rfl:  .  lisinopril (ZESTRIL) 20 MG tablet, Take 20 mg by mouth daily., Disp: , Rfl:  .  Melatonin 5 MG TABS, Take 5 mg by mouth at bedtime. , Disp: , Rfl:  .  tamsulosin (FLOMAX) 0.4 MG CAPS capsule, Take 1 capsule (0.4 mg total) by mouth daily., Disp: 90 capsule, Rfl: 3 .  traZODone (DESYREL) 50 MG tablet, Take 25 mg by mouth at bedtime., Disp: , Rfl:  .  warfarin (COUMADIN) 7.5 MG tablet, TAKE 1 TABLET BY MOUTH  DAILY, Disp: 90 tablet, Rfl: 3   No Known Allergies  ROS Review of Systems  Constitutional: Negative.   HENT: Negative.   Eyes: Negative.   Respiratory: Negative.   Cardiovascular: Negative.   Gastrointestinal: Negative.   Endocrine: Negative.   Genitourinary: Negative.    Musculoskeletal: Negative.   Skin: Negative.   Allergic/Immunologic: Negative.   Neurological: Negative.   Hematological: Negative.   Psychiatric/Behavioral: Negative.   All other systems reviewed and are negative.     Objective:    Physical Exam Vitals reviewed.  Constitutional:      Appearance: Normal appearance.  HENT:     Mouth/Throat:     Mouth: Mucous membranes are moist.  Eyes:     Pupils: Pupils are equal, round, and reactive to light.  Neck:     Vascular: No carotid bruit.  Cardiovascular:     Rate and Rhythm: Normal rate and regular rhythm.     Pulses: Normal pulses.     Heart sounds: Normal heart sounds.  Pulmonary:     Effort: Pulmonary effort is normal.     Breath sounds: Normal breath sounds.  Abdominal:     General: Bowel sounds are normal.     Palpations: Abdomen is soft. There is no hepatomegaly, splenomegaly or mass.     Tenderness: There is no abdominal tenderness.     Hernia: No hernia is present.  Musculoskeletal:     Cervical back: Neck supple.     Right lower leg: No edema.     Left lower leg: No edema.  Skin:    Findings: No rash.  Neurological:     Mental Status: He is alert and oriented to person, place, and time.     Motor: No weakness.  Psychiatric:        Mood and Affect: Mood normal.        Behavior: Behavior normal.     BP 138/82   Pulse 84   Ht 5\' 7"  (1.702 m)   Wt 167 lb (75.8 kg)   BMI 26.16 kg/m  Wt Readings from Last 3 Encounters:  09/28/20 167 lb (75.8 kg)  08/25/20 167 lb (75.8 kg)  07/13/20 168 lb 3.2 oz (76.3 kg)     Health Maintenance Due  Topic Date Due  . TETANUS/TDAP  Never done  . PNA vac Low Risk Adult (1 of 2 - PCV13) Never done  . COVID-19 Vaccine (3 - Booster for Moderna series) 04/12/2020    There are no preventive care reminders to display for this patient.  Lab Results  Component Value Date   TSH 1.77 08/25/2020   Lab Results  Component Value Date   WBC 9.7 08/25/2020   HGB 14.0  08/25/2020   HCT 41.1 08/25/2020   MCV 91.5 08/25/2020   PLT 175 08/25/2020   Lab Results  Component Value Date  NA 142 08/25/2020   K 4.6 08/25/2020   CO2 26 08/25/2020   GLUCOSE 98 08/25/2020   BUN 18 08/25/2020   CREATININE 1.21 (H) 08/25/2020   BILITOT 0.9 08/25/2020   AST 25 08/25/2020   ALT 25 08/25/2020   PROT 7.2 08/25/2020   CALCIUM 9.9 08/25/2020   ANIONGAP 9 07/02/2019   No results found for: CHOL No results found for: HDL No results found for: LDLCALC No results found for: TRIG No results found for: CHOLHDL Lab Results  Component Value Date   HGBA1C 6.5 (H) 08/25/2020      Assessment & Plan:   Problem List Items Addressed This Visit      Cardiovascular and Mediastinum   Atrial fibrillation ([place]) - Primary    Atrial fibrillation is under control      Relevant Orders   POCT INR (Completed)     Respiratory   OSA on CPAP    Obstructive sleep apnea is stable.        Other   Restless legs syndrome ([facility])    Restless leg syndrome is stable on the present therapeutic regime.      Suspected COVID-19 virus infection    Patient does not have any signs symptom of Covid infection.  He has received his Covid vaccination.         No orders of the defined types were placed in this encounter.   Follow-up: No follow-ups on file.    Cletis Athens, MD

## 2020-09-28 NOTE — Assessment & Plan Note (Signed)
Restless leg syndrome is stable on the present therapeutic regime.

## 2020-10-05 DIAGNOSIS — I6389 Other cerebral infarction: Secondary | ICD-10-CM | POA: Diagnosis not present

## 2020-10-05 DIAGNOSIS — E782 Mixed hyperlipidemia: Secondary | ICD-10-CM | POA: Diagnosis not present

## 2020-10-05 DIAGNOSIS — R0602 Shortness of breath: Secondary | ICD-10-CM | POA: Diagnosis not present

## 2020-10-05 DIAGNOSIS — I10 Essential (primary) hypertension: Secondary | ICD-10-CM | POA: Diagnosis not present

## 2020-10-05 DIAGNOSIS — I2510 Atherosclerotic heart disease of native coronary artery without angina pectoris: Secondary | ICD-10-CM | POA: Diagnosis not present

## 2020-10-26 ENCOUNTER — Other Ambulatory Visit: Payer: Self-pay

## 2020-10-26 ENCOUNTER — Ambulatory Visit (INDEPENDENT_AMBULATORY_CARE_PROVIDER_SITE_OTHER): Payer: Medicare Other | Admitting: Internal Medicine

## 2020-10-26 ENCOUNTER — Encounter: Payer: Self-pay | Admitting: Internal Medicine

## 2020-10-26 VITALS — BP 140/79 | HR 65 | Ht 67.0 in | Wt 171.8 lb

## 2020-10-26 DIAGNOSIS — I4811 Longstanding persistent atrial fibrillation: Secondary | ICD-10-CM

## 2020-10-26 DIAGNOSIS — G4733 Obstructive sleep apnea (adult) (pediatric): Secondary | ICD-10-CM

## 2020-10-26 DIAGNOSIS — I10 Essential (primary) hypertension: Secondary | ICD-10-CM

## 2020-10-26 DIAGNOSIS — Z9989 Dependence on other enabling machines and devices: Secondary | ICD-10-CM

## 2020-10-26 DIAGNOSIS — G250 Essential tremor: Secondary | ICD-10-CM | POA: Diagnosis not present

## 2020-10-26 LAB — POCT INR: INR: 3.4 — AB (ref 2.0–3.0)

## 2020-10-26 NOTE — Progress Notes (Signed)
Established Patient Office Visit  Subjective:  Patient ID: Kyle Santos, male    DOB: 1936/02/01  Age: 85 y.o. MRN: [id number]  CC:  Chief Complaint  Patient presents with  . Atrial Fibrillation    Patient here for 1 month follow up     HPI  Kyle Santos presents for  Patient is known to have atrial fibrillation essential hypertension essential tremors also has a history of kidney stones in the past.  He is on Coumadin and came for a pro time checkup.  Recently has taken some naproxen which caused elevation of the pro time.  Patient has a history of colonoscopy in the past  Past Medical History:  Diagnosis Date  . A-fib ([place])   . Arthritis   . BPH (benign prostatic hyperplasia)   . Depression   . Heart murmur   . HOH (hard of hearing)   . Hypercholesteremia   . Hypertension   . Loss of equilibrium    being evaluated at this time.  . Pneumonia    history of  . Sleep apnea     Past Surgical History:  Procedure Laterality Date  . CATARACT EXTRACTION W/PHACO Right 01/09/2017   Procedure: CATARACT EXTRACTION PHACO AND INTRAOCULAR LENS PLACEMENT (IOC);  Surgeon: Birder Robson, MD;  Location: [facility] ORS;  Service: Ophthalmology;  Laterality: Right;  [place] 01:06 AP% 19.7 CDE 13.18 Fluid pack lot # [id number]  . CATARACT EXTRACTION W/PHACO Left 08/07/2017   Procedure: CATARACT EXTRACTION PHACO AND INTRAOCULAR LENS PLACEMENT (IOC)-LEFT;  Surgeon: Birder Robson, MD;  Location: [facility] ORS;  Service: Ophthalmology;  Laterality: Left;  [place]  00:43 AP% 15.0 CDE 6.50 Fluid pack lot # [id number]  . COLONOSCOPY    . JOINT REPLACEMENT     knee  . JOINT REPLACEMENT     shoulder    Family History  Problem Relation Age of Onset  . Heart disease Father     Social History   Socioeconomic History  . Marital status: Married    Spouse name: Not on file  . Number of children: Not on file  . Years of education: Not on file  . Highest education level: Not on file  Occupational  History  . Not on file  Tobacco Use  . Smoking status: Former Smoker    Quit date: 08/22/1943    Years since quitting: 77.2  . Smokeless tobacco: Former Network engineer and Sexual Activity  . Alcohol use: Yes    Alcohol/week: 0.0 standard drinks    Comment: occasional  . Drug use: No  . Sexual activity: Not Currently    Birth control/protection: None  Other Topics Concern  . Not on file  Social History Narrative   Drinks about 2 cups of caffeine.   Social Determinants of Health   Financial Resource Strain: Not on file  Food Insecurity: Not on file  Transportation Needs: Not on file  Physical Activity: Not on file  Stress: Not on file  Social Connections: Not on file  Intimate Partner Violence: Not on file     Current Outpatient Medications:  .  acyclovir (ZOVIRAX) 800 MG tablet, Take 800 mg by mouth 4 (four) times daily., Disp: , Rfl:  .  atorvastatin (LIPITOR) 80 MG tablet, Take 80 mg by mouth daily. , Disp: , Rfl:  .  B Complex Vitamins (B COMPLEX-B12 PO), Take 1 tablet by mouth daily., Disp: , Rfl:  .  Cascara [place] 450 MG CAPS, Take 450 mg by mouth 2 (  two) times daily., Disp: , Rfl:  .  Cholecalciferol (VITAMIN D3) 1000 units CAPS, Take 1,000 Units by mouth daily at 3 pm., Disp: , Rfl:  .  ibuprofen (ADVIL,MOTRIN) 200 MG tablet, Take 400 mg by mouth every 6 (six) hours as needed for headache or moderate pain., Disp: , Rfl:  .  lisinopril (ZESTRIL) 20 MG tablet, Take 20 mg by mouth daily., Disp: , Rfl:  .  Melatonin 5 MG TABS, Take 5 mg by mouth at bedtime. , Disp: , Rfl:  .  tamsulosin (FLOMAX) 0.4 MG CAPS capsule, Take 1 capsule (0.4 mg total) by mouth daily., Disp: 90 capsule, Rfl: 3 .  traZODone (DESYREL) 50 MG tablet, Take 25 mg by mouth at bedtime., Disp: , Rfl:  .  warfarin (COUMADIN) 7.5 MG tablet, TAKE 1 TABLET BY MOUTH  DAILY, Disp: 90 tablet, Rfl: 3   No Known Allergies  ROS Review of Systems  Constitutional: Negative.   Eyes: Negative.   Respiratory:  Negative.   Cardiovascular: Negative.   Gastrointestinal: Negative.   Endocrine: Negative.   Genitourinary: Negative.   Skin: Negative.   Hematological: Negative.   All other systems reviewed and are negative.     Objective:    Physical Exam Vitals reviewed.  Constitutional:      Appearance: Normal appearance.  HENT:     Mouth/Throat:     Mouth: Mucous membranes are moist.  Eyes:     Pupils: Pupils are equal, round, and reactive to light.  Neck:     Vascular: No carotid bruit.  Cardiovascular:     Rate and Rhythm: Normal rate and regular rhythm.     Pulses: Normal pulses.     Heart sounds: Normal heart sounds.  Pulmonary:     Effort: Pulmonary effort is normal.     Breath sounds: Normal breath sounds.  Abdominal:     General: Bowel sounds are normal.     Palpations: Abdomen is soft. There is no hepatomegaly, splenomegaly or mass.     Tenderness: There is no abdominal tenderness.     Hernia: No hernia is present.  Musculoskeletal:     Cervical back: Neck supple.     Right lower leg: No edema.     Left lower leg: No edema.  Skin:    Findings: No rash.  Neurological:     Mental Status: He is alert and oriented to person, place, and time.     Motor: No weakness.  Psychiatric:        Mood and Affect: Mood normal.        Behavior: Behavior normal.     BP 140/79   Pulse 65   Ht 5\' 7"  (1.702 m)   Wt 171 lb 12.8 oz (77.9 kg)   BMI 26.91 kg/m  Wt Readings from Last 3 Encounters:  10/26/20 171 lb 12.8 oz (77.9 kg)  09/28/20 167 lb (75.8 kg)  08/25/20 167 lb (75.8 kg)     Health Maintenance Due  Topic Date Due  . TETANUS/TDAP  Never done  . PNA vac Low Risk Adult (1 of 2 - PCV13) Never done  . COVID-19 Vaccine (3 - Booster for Moderna series) 04/12/2020    There are no preventive care reminders to display for this patient.  Lab Results  Component Value Date   TSH 1.77 08/25/2020   Lab Results  Component Value Date   WBC 9.7 08/25/2020   HGB 14.0  08/25/2020   HCT 41.1 08/25/2020   MCV  91.5 08/25/2020   PLT 175 08/25/2020   Lab Results  Component Value Date   NA 142 08/25/2020   K 4.6 08/25/2020   CO2 26 08/25/2020   GLUCOSE 98 08/25/2020   BUN 18 08/25/2020   CREATININE 1.21 (H) 08/25/2020   BILITOT 0.9 08/25/2020   AST 25 08/25/2020   ALT 25 08/25/2020   PROT 7.2 08/25/2020   CALCIUM 9.9 08/25/2020   ANIONGAP 9 07/02/2019   No results found for: CHOL No results found for: HDL No results found for: LDLCALC No results found for: TRIG No results found for: CHOLHDL Lab Results  Component Value Date   HGBA1C 6.5 (H) 08/25/2020      Assessment & Plan:   Problem List Items Addressed This Visit      Cardiovascular and Mediastinum   Atrial fibrillation ([place]) - Primary    Atrial fibrillation is stable at the present time without any embolic episode, pro time today is supratherapeutic.  I advised him not to take naproxen anymore.      Relevant Orders   POCT INR (Completed)   Essential hypertension    Patient blood pressure is normal patient denies any chest pain or shortness of breath there is no history of palpitation paroxysmal nocturnal dyspnea patient can walk 100 yards without any problem patient was advised to follow low-salt low-cholesterol diet        Respiratory   OSA on CPAP    Patient is tolerating CPAP very well.        Nervous and Auditory   Tremor, essential    Patient tremors are not getting any worse         No orders of the defined types were placed in this encounter.   Follow-up: No follow-ups on file.    Cletis Athens, MD

## 2020-10-26 NOTE — Assessment & Plan Note (Signed)
Patient blood pressure is normal patient denies any chest pain or shortness of breath there is no history of palpitation paroxysmal nocturnal dyspnea patient can walk 100 yards without any problem patient was advised to follow low-salt low-cholesterol diet

## 2020-10-26 NOTE — Assessment & Plan Note (Signed)
Patient tremors are not getting any worse

## 2020-10-26 NOTE — Assessment & Plan Note (Signed)
Patient is tolerating CPAP very well.

## 2020-10-26 NOTE — Assessment & Plan Note (Signed)
Atrial fibrillation is stable at the present time without any embolic episode, pro time today is supratherapeutic.  I advised him not to take naproxen anymore.

## 2020-11-02 DIAGNOSIS — I2510 Atherosclerotic heart disease of native coronary artery without angina pectoris: Secondary | ICD-10-CM | POA: Diagnosis not present

## 2020-11-02 DIAGNOSIS — E785 Hyperlipidemia, unspecified: Secondary | ICD-10-CM | POA: Diagnosis not present

## 2020-11-02 DIAGNOSIS — I4891 Unspecified atrial fibrillation: Secondary | ICD-10-CM | POA: Diagnosis not present

## 2020-11-02 DIAGNOSIS — I10 Essential (primary) hypertension: Secondary | ICD-10-CM | POA: Diagnosis not present

## 2020-11-03 ENCOUNTER — Other Ambulatory Visit: Payer: Self-pay | Admitting: *Deleted

## 2020-11-11 ENCOUNTER — Other Ambulatory Visit: Payer: Self-pay | Admitting: *Deleted

## 2020-11-11 MED ORDER — AMPICILLIN 500 MG PO CAPS: ORAL_CAPSULE | ORAL | 0 refills | Status: DC

## 2020-11-29 ENCOUNTER — Other Ambulatory Visit: Payer: Self-pay

## 2020-11-29 ENCOUNTER — Ambulatory Visit (INDEPENDENT_AMBULATORY_CARE_PROVIDER_SITE_OTHER): Payer: Medicare Other | Admitting: Family Medicine

## 2020-11-29 ENCOUNTER — Encounter: Payer: Self-pay | Admitting: Family Medicine

## 2020-11-29 VITALS — BP 143/82 | HR 63 | Ht 67.0 in | Wt 169.7 lb

## 2020-11-29 DIAGNOSIS — I4811 Longstanding persistent atrial fibrillation: Secondary | ICD-10-CM

## 2020-11-29 LAB — POCT INR: INR: 2.5 (ref 2.0–3.0)

## 2020-11-29 NOTE — Assessment & Plan Note (Signed)
INR 2.5 today, Rate is irreg but stable rate.   Fu 1 month for INR.

## 2020-11-29 NOTE — Progress Notes (Signed)
Established Patient Office Visit  SUBJECTIVE:  Subjective  Patient ID: Kyle Santos, male    DOB: 1935/10/02  Age: 85 y.o. MRN: [id number]  CC:  Chief Complaint  Patient presents with  . Atrial Fibrillation    HPI Kyle Santos is a 85 y.o. male presenting today for     Past Medical History:  Diagnosis Date  . A-fib ([place])   . Arthritis   . BPH (benign prostatic hyperplasia)   . Depression   . Heart murmur   . HOH (hard of hearing)   . Hypercholesteremia   . Hypertension   . Loss of equilibrium    being evaluated at this time.  . Pneumonia    history of  . Sleep apnea     Past Surgical History:  Procedure Laterality Date  . CATARACT EXTRACTION W/PHACO Right 01/09/2017   Procedure: CATARACT EXTRACTION PHACO AND INTRAOCULAR LENS PLACEMENT (IOC);  Surgeon: Birder Robson, MD;  Location: [facility] ORS;  Service: Ophthalmology;  Laterality: Right;  [place] 01:06 AP% 19.7 CDE 13.18 Fluid pack lot # [id number]  . CATARACT EXTRACTION W/PHACO Left 08/07/2017   Procedure: CATARACT EXTRACTION PHACO AND INTRAOCULAR LENS PLACEMENT (IOC)-LEFT;  Surgeon: Birder Robson, MD;  Location: [facility] ORS;  Service: Ophthalmology;  Laterality: Left;  [place]  00:43 AP% 15.0 CDE 6.50 Fluid pack lot # [id number]  . COLONOSCOPY    . JOINT REPLACEMENT     knee  . JOINT REPLACEMENT     shoulder    Family History  Problem Relation Age of Onset  . Heart disease Father     Social History   Socioeconomic History  . Marital status: Married    Spouse name: Not on file  . Number of children: Not on file  . Years of education: Not on file  . Highest education level: Not on file  Occupational History  . Not on file  Tobacco Use  . Smoking status: Former Smoker    Quit date: 08/22/1943    Years since quitting: 77.3  . Smokeless tobacco: Former Network engineer and Sexual Activity  . Alcohol use: Yes    Alcohol/week: 0.0 standard drinks    Comment: occasional  . Drug use: No  . Sexual activity:  Not Currently    Birth control/protection: None  Other Topics Concern  . Not on file  Social History Narrative   Drinks about 2 cups of caffeine.   Social Determinants of Health   Financial Resource Strain: Not on file  Food Insecurity: Not on file  Transportation Needs: Not on file  Physical Activity: Not on file  Stress: Not on file  Social Connections: Not on file  Intimate Partner Violence: Not on file     Current Outpatient Medications:  .  acyclovir (ZOVIRAX) 800 MG tablet, Take 800 mg by mouth 4 (four) times daily., Disp: , Rfl:  .  ampicillin (PRINCIPEN) 500 MG capsule, 4 capsules 1 hour before dental procedure, Disp: 4 capsule, Rfl: 0 .  atorvastatin (LIPITOR) 80 MG tablet, Take 80 mg by mouth daily. , Disp: , Rfl:  .  B Complex Vitamins (B COMPLEX-B12 PO), Take 1 tablet by mouth daily., Disp: , Rfl:  .  Cascara [place] 450 MG CAPS, Take 450 mg by mouth 2 (two) times daily., Disp: , Rfl:  .  Cholecalciferol (VITAMIN D3) 1000 units CAPS, Take 1,000 Units by mouth daily at 3 pm., Disp: , Rfl:  .  ibuprofen (ADVIL,MOTRIN) 200 MG tablet, Take 400 mg by  mouth every 6 (six) hours as needed for headache or moderate pain., Disp: , Rfl:  .  lisinopril (ZESTRIL) 20 MG tablet, Take 20 mg by mouth daily., Disp: , Rfl:  .  Melatonin 5 MG TABS, Take 5 mg by mouth at bedtime. , Disp: , Rfl:  .  tamsulosin (FLOMAX) 0.4 MG CAPS capsule, Take 1 capsule (0.4 mg total) by mouth daily., Disp: 90 capsule, Rfl: 3 .  traZODone (DESYREL) 50 MG tablet, Take 25 mg by mouth at bedtime., Disp: , Rfl:  .  warfarin (COUMADIN) 7.5 MG tablet, TAKE 1 TABLET BY MOUTH  DAILY, Disp: 90 tablet, Rfl: 3   No Known Allergies  ROS Review of Systems  Constitutional: Negative.   HENT: Negative.   Respiratory: Negative.   Genitourinary: Negative.   Musculoskeletal: Negative.   Neurological: Negative.   Psychiatric/Behavioral: Negative.      OBJECTIVE:    Physical Exam Vitals and nursing note reviewed.   Constitutional:      Appearance: Normal appearance.  HENT:     Right Ear: Tympanic membrane normal.     Left Ear: Tympanic membrane normal.     Nose: Nose normal.  Cardiovascular:     Rate and Rhythm: Rhythm irregular.  Musculoskeletal:        General: Normal range of motion.  Neurological:     Mental Status: He is alert.  Psychiatric:        Mood and Affect: Mood normal.     BP (!) 143/82   Pulse 63   Ht 5\' 7"  (1.702 m)   Wt 169 lb 11.2 oz (77 kg)   BMI 26.58 kg/m  Wt Readings from Last 3 Encounters:  11/29/20 169 lb 11.2 oz (77 kg)  10/26/20 171 lb 12.8 oz (77.9 kg)  09/28/20 167 lb (75.8 kg)    Health Maintenance Due  Topic Date Due  . TETANUS/TDAP  Never done  . PNA vac Low Risk Adult (1 of 2 - PCV13) Never done  . COVID-19 Vaccine (3 - Booster for Moderna series) 04/12/2020    There are no preventive care reminders to display for this patient.  CBC Latest Ref Rng & Units 08/25/2020 07/02/2019 06/18/2017  WBC 3.8 - 10.8 Thousand/uL 9.7 8.5 7.1  Hemoglobin 13.2 - 17.1 g/dL 14.0 14.5 13.6  Hematocrit 38.5 - 50.0 % 41.1 42.2 39.9(L)  Platelets 140 - 400 Thousand/uL 175 163 156   CMP Latest Ref Rng & Units 08/25/2020 07/02/2019 06/18/2017  Glucose 65 - 99 mg/dL 98 94 140(H)  BUN 7 - 25 mg/dL 18 20 27(H)  Creatinine 0.70 - 1.11 mg/dL 1.21(H) 1.25(H) 1.61(H)  Sodium 135 - 146 mmol/L 142 141 138  Potassium 3.5 - 5.3 mmol/L 4.6 3.9 4.1  Chloride 98 - 110 mmol/L 106 105 106  CO2 20 - 32 mmol/L 26 27 24   Calcium 8.6 - 10.3 mg/dL 9.9 9.2 9.1  Total Protein 6.1 - 8.1 g/dL 7.2 - -  Total Bilirubin 0.2 - 1.2 mg/dL 0.9 - -  AST 10 - 35 U/L 25 - -  ALT 9 - 46 U/L 25 - -    Lab Results  Component Value Date   TSH 1.77 08/25/2020   Lab Results  Component Value Date   ANIONGAP 9 07/02/2019   No results found for: CHOL, HDL, LDLCALC, CHOLHDL No results found for: TRIG Lab Results  Component Value Date   HGBA1C 6.5 (H) 08/25/2020      ASSESSMENT & PLAN:    Problem  List Items Addressed This Visit      Cardiovascular and Mediastinum   Atrial fibrillation ([place]) - Primary    INR 2.5 today, Rate is irreg but stable rate.   Fu 1 month for INR.       Relevant Orders   POCT INR (Completed)      No orders of the defined types were placed in this encounter.     Follow-up: No follow-ups on file.    Beckie Salts, [place] [address], [place], [place] [address]

## 2020-12-29 ENCOUNTER — Ambulatory Visit: Payer: Medicare Other | Admitting: Internal Medicine

## 2020-12-30 ENCOUNTER — Other Ambulatory Visit: Payer: Self-pay

## 2020-12-30 ENCOUNTER — Ambulatory Visit (INDEPENDENT_AMBULATORY_CARE_PROVIDER_SITE_OTHER): Payer: Medicare Other | Admitting: Family Medicine

## 2020-12-30 ENCOUNTER — Encounter: Payer: Self-pay | Admitting: Family Medicine

## 2020-12-30 VITALS — BP 156/82 | HR 55 | Ht 67.0 in | Wt 170.7 lb

## 2020-12-30 DIAGNOSIS — I4811 Longstanding persistent atrial fibrillation: Secondary | ICD-10-CM

## 2020-12-30 LAB — POCT INR: INR: 4.1 — AB (ref 2.0–3.0)

## 2020-12-30 MED ORDER — WARFARIN SODIUM 5 MG PO TABS: 5.0000 mg | ORAL_TABLET | Freq: Every day | ORAL | 3 refills | Status: DC

## 2020-12-30 NOTE — Assessment & Plan Note (Signed)
INR 4.1 today. He is currently taking 7.5 daily. I am going to hold tomorrows dose then start 5 mg every other day alternating with 7.5 mg. Fu 2 weeks.

## 2020-12-30 NOTE — Progress Notes (Signed)
Established Patient Office Visit  SUBJECTIVE:  Subjective  Patient ID: Kyle Santos, male    DOB: [birth year]  Age: 85 y.o. MRN: [id number]  CC:  Chief Complaint  Patient presents with  . Headache    Patient complains of having headache behind eyes for past couple of days.   . Hypertension    Patient states that blood pressure elevated when he checks at home. Also elevated today in office.   . Atrial Fibrillation    HPI Kyle Santos is a 85 y.o. male presenting today for     Past Medical History:  Diagnosis Date  . A-fib ([place])   . Arthritis   . BPH (benign prostatic hyperplasia)   . Depression   . Heart murmur   . HOH (hard of hearing)   . Hypercholesteremia   . Hypertension   . Loss of equilibrium    being evaluated at this time.  . Pneumonia    history of  . Sleep apnea     Past Surgical History:  Procedure Laterality Date  . CATARACT EXTRACTION W/PHACO Right 01/09/2017   Procedure: CATARACT EXTRACTION PHACO AND INTRAOCULAR LENS PLACEMENT (IOC);  Surgeon: Birder Robson, MD;  Location: [facility] ORS;  Service: Ophthalmology;  Laterality: Right;  [place] 01:06 AP% 19.7 CDE 13.18 Fluid pack lot # [id number]  . CATARACT EXTRACTION W/PHACO Left 08/07/2017   Procedure: CATARACT EXTRACTION PHACO AND INTRAOCULAR LENS PLACEMENT (IOC)-LEFT;  Surgeon: Birder Robson, MD;  Location: [facility] ORS;  Service: Ophthalmology;  Laterality: Left;  [place]  00:43 AP% 15.0 CDE 6.50 Fluid pack lot # [id number]  . COLONOSCOPY    . JOINT REPLACEMENT     knee  . JOINT REPLACEMENT     shoulder    Family History  Problem Relation Age of Onset  . Heart disease Father     Social History   Socioeconomic History  . Marital status: Married    Spouse name: Not on file  . Number of children: Not on file  . Years of education: Not on file  . Highest education level: Not on file  Occupational History  . Not on file  Tobacco Use  . Smoking status: Former Smoker    Quit date: 08/22/1943     Years since quitting: 77.4  . Smokeless tobacco: Former Network engineer and Sexual Activity  . Alcohol use: Yes    Alcohol/week: 0.0 standard drinks    Comment: occasional  . Drug use: No  . Sexual activity: Not Currently    Birth control/protection: None  Other Topics Concern  . Not on file  Social History Narrative   Drinks about 2 cups of caffeine.   Social Determinants of Health   Financial Resource Strain: Not on file  Food Insecurity: Not on file  Transportation Needs: Not on file  Physical Activity: Not on file  Stress: Not on file  Social Connections: Not on file  Intimate Partner Violence: Not on file     Current Outpatient Medications:  .  acyclovir (ZOVIRAX) 800 MG tablet, Take 800 mg by mouth 4 (four) times daily., Disp: , Rfl:  .  ampicillin (PRINCIPEN) 500 MG capsule, 4 capsules 1 hour before dental procedure, Disp: 4 capsule, Rfl: 0 .  atorvastatin (LIPITOR) 80 MG tablet, Take 80 mg by mouth daily. , Disp: , Rfl:  .  B Complex Vitamins (B COMPLEX-B12 PO), Take 1 tablet by mouth daily., Disp: , Rfl:  .  Cascara [place]rada 450 MG CAPS, Take 450 mg  by mouth 2 (two) times daily., Disp: , Rfl:  .  Cholecalciferol (VITAMIN D3) 1000 units CAPS, Take 1,000 Units by mouth daily at 3 pm., Disp: , Rfl:  .  ibuprofen (ADVIL,MOTRIN) 200 MG tablet, Take 400 mg by mouth every 6 (six) hours as needed for headache or moderate pain., Disp: , Rfl:  .  lisinopril (ZESTRIL) 20 MG tablet, Take 20 mg by mouth daily., Disp: , Rfl:  .  Melatonin 5 MG TABS, Take 5 mg by mouth at bedtime. , Disp: , Rfl:  .  tamsulosin (FLOMAX) 0.4 MG CAPS capsule, Take 1 capsule (0.4 mg total) by mouth daily., Disp: 90 capsule, Rfl: 3 .  traZODone (DESYREL) 50 MG tablet, Take 25 mg by mouth at bedtime., Disp: , Rfl:  .  warfarin (COUMADIN) 5 MG tablet, Take 1 tablet (5 mg total) by mouth daily., Disp: 30 tablet, Rfl: 3 .  warfarin (COUMADIN) 7.5 MG tablet, TAKE 1 TABLET BY MOUTH  DAILY, Disp: 90 tablet, Rfl:  3   No Known Allergies  ROS Review of Systems  Constitutional: Negative.   HENT: Negative.   Respiratory: Negative.   Cardiovascular: Negative.   Genitourinary: Negative.      OBJECTIVE:    Physical Exam Vitals reviewed.  HENT:     Head: Normocephalic.  Cardiovascular:     Rate and Rhythm: Regular rhythm.  Neurological:     Mental Status: He is alert.     Cranial Nerves: Cranial nerve deficit present.     BP (!) 156/82   Pulse (!) 55   Ht 5\' 7"  (1.702 m)   Wt 170 lb 11.2 oz (77.4 kg)   BMI 26.74 kg/m  Wt Readings from Last 3 Encounters:  12/30/20 170 lb 11.2 oz (77.4 kg)  11/29/20 169 lb 11.2 oz (77 kg)  10/26/20 171 lb 12.8 oz (77.9 kg)    Health Maintenance Due  Topic Date Due  . TETANUS/TDAP  Never done  . PNA vac Low Risk Adult (1 of 2 - PCV13) Never done  . COVID-19 Vaccine (3 - Booster for Moderna series) 03/12/2020    There are no preventive care reminders to display for this patient.  CBC Latest Ref Rng & Units 08/25/2020 07/02/2019 06/18/2017  WBC 3.8 - 10.8 Thousand/uL 9.7 8.5 7.1  Hemoglobin 13.2 - 17.1 g/dL 14.0 14.5 13.6  Hematocrit 38.5 - 50.0 % 41.1 42.2 39.9(L)  Platelets 140 - 400 Thousand/uL 175 163 156   CMP Latest Ref Rng & Units 08/25/2020 07/02/2019 06/18/2017  Glucose 65 - 99 mg/dL 98 94 140(H)  BUN 7 - 25 mg/dL 18 20 27(H)  Creatinine 0.70 - 1.11 mg/dL 1.21(H) 1.25(H) 1.61(H)  Sodium 135 - 146 mmol/L 142 141 138  Potassium 3.5 - 5.3 mmol/L 4.6 3.9 4.1  Chloride 98 - 110 mmol/L 106 105 106  CO2 20 - 32 mmol/L 26 27 24   Calcium 8.6 - 10.3 mg/dL 9.9 9.2 9.1  Total Protein 6.1 - 8.1 g/dL 7.2 - -  Total Bilirubin 0.2 - 1.2 mg/dL 0.9 - -  AST 10 - 35 U/L 25 - -  ALT 9 - 46 U/L 25 - -    Lab Results  Component Value Date   TSH 1.77 08/25/2020   Lab Results  Component Value Date   ANIONGAP 9 07/02/2019   No results found for: CHOL, HDL, LDLCALC, CHOLHDL No results found for: TRIG Lab Results  Component Value Date   HGBA1C  6.5 (H) 08/25/2020  ASSESSMENT & PLAN:   Problem List Items Addressed This Visit      Cardiovascular and Mediastinum   Atrial fibrillation ([place]) - Primary    INR 4.1 today. He is currently taking 7.5 daily. I am going to hold tomorrows dose then start 5 mg every other day alternating with 7.5 mg. Fu 2 weeks.       Relevant Medications   warfarin (COUMADIN) 5 MG tablet   Other Relevant Orders   POCT INR (Completed)      Meds ordered this encounter  Medications  . warfarin (COUMADIN) 5 MG tablet    Sig: Take 1 tablet (5 mg total) by mouth daily.    Dispense:  30 tablet    Refill:  3      Follow-up: No follow-ups on file.    Beckie Salts, [place] [address] [place], [place] [address]

## 2021-01-13 ENCOUNTER — Other Ambulatory Visit: Payer: Self-pay

## 2021-01-13 ENCOUNTER — Ambulatory Visit (INDEPENDENT_AMBULATORY_CARE_PROVIDER_SITE_OTHER): Payer: Medicare Other | Admitting: Family Medicine

## 2021-01-13 ENCOUNTER — Encounter: Payer: Self-pay | Admitting: Family Medicine

## 2021-01-13 VITALS — BP 143/84 | HR 64 | Ht 67.0 in | Wt 166.2 lb

## 2021-01-13 DIAGNOSIS — I4811 Longstanding persistent atrial fibrillation: Secondary | ICD-10-CM

## 2021-01-13 DIAGNOSIS — Z7901 Long term (current) use of anticoagulants: Secondary | ICD-10-CM | POA: Diagnosis not present

## 2021-01-13 LAB — POCT INR: INR: 1.7 — AB (ref 2.0–3.0)

## 2021-01-13 NOTE — Progress Notes (Signed)
Established Patient Office Visit  SUBJECTIVE:  Subjective  Patient ID: Kyle Santos, male    DOB: 1936/03/07  Age: 85 y.o. MRN: [id number]  CC:  Chief Complaint  Patient presents with  . Atrial Fibrillation    HPI Kyle Santos is a 85 y.o. male presenting today for     Past Medical History:  Diagnosis Date  . A-fib ([place])   . Arthritis   . BPH (benign prostatic hyperplasia)   . Depression   . Heart murmur   . HOH (hard of hearing)   . Hypercholesteremia   . Hypertension   . Loss of equilibrium    being evaluated at this time.  . Pneumonia    history of  . Sleep apnea     Past Surgical History:  Procedure Laterality Date  . CATARACT EXTRACTION W/PHACO Right 01/09/2017   Procedure: CATARACT EXTRACTION PHACO AND INTRAOCULAR LENS PLACEMENT (IOC);  Surgeon: Birder Robson, MD;  Location: [facility] ORS;  Service: Ophthalmology;  Laterality: Right;  [place] 01:06 AP% 19.7 CDE 13.18 Fluid pack lot # [id number]  . CATARACT EXTRACTION W/PHACO Left 08/07/2017   Procedure: CATARACT EXTRACTION PHACO AND INTRAOCULAR LENS PLACEMENT (IOC)-LEFT;  Surgeon: Birder Robson, MD;  Location: [facility] ORS;  Service: Ophthalmology;  Laterality: Left;  [place]  00:43 AP% 15.0 CDE 6.50 Fluid pack lot # [id number]  . COLONOSCOPY    . JOINT REPLACEMENT     knee  . JOINT REPLACEMENT     shoulder    Family History  Problem Relation Age of Onset  . Heart disease Father     Social History   Socioeconomic History  . Marital status: Married    Spouse name: Not on file  . Number of children: Not on file  . Years of education: Not on file  . Highest education level: Not on file  Occupational History  . Not on file  Tobacco Use  . Smoking status: Former Smoker    Quit date: 08/22/1943    Years since quitting: 77.4  . Smokeless tobacco: Former Network engineer and Sexual Activity  . Alcohol use: Yes    Alcohol/week: 0.0 standard drinks    Comment: occasional  . Drug use: No  . Sexual activity:  Not Currently    Birth control/protection: None  Other Topics Concern  . Not on file  Social History Narrative   Drinks about 2 cups of caffeine.   Social Determinants of Health   Financial Resource Strain: Not on file  Food Insecurity: Not on file  Transportation Needs: Not on file  Physical Activity: Not on file  Stress: Not on file  Social Connections: Not on file  Intimate Partner Violence: Not on file     Current Outpatient Medications:  .  acyclovir (ZOVIRAX) 800 MG tablet, Take 800 mg by mouth 4 (four) times daily., Disp: , Rfl:  .  ampicillin (PRINCIPEN) 500 MG capsule, 4 capsules 1 hour before dental procedure, Disp: 4 capsule, Rfl: 0 .  atorvastatin (LIPITOR) 80 MG tablet, Take 80 mg by mouth daily. , Disp: , Rfl:  .  B Complex Vitamins (B COMPLEX-B12 PO), Take 1 tablet by mouth daily., Disp: , Rfl:  .  Cascara [place]rada 450 MG CAPS, Take 450 mg by mouth 2 (two) times daily., Disp: , Rfl:  .  Cholecalciferol (VITAMIN D3) 1000 units CAPS, Take 1,000 Units by mouth daily at 3 pm., Disp: , Rfl:  .  ibuprofen (ADVIL,MOTRIN) 200 MG tablet, Take 400 mg by  mouth every 6 (six) hours as needed for headache or moderate pain., Disp: , Rfl:  .  lisinopril (ZESTRIL) 20 MG tablet, Take 20 mg by mouth daily., Disp: , Rfl:  .  Melatonin 5 MG TABS, Take 5 mg by mouth at bedtime. , Disp: , Rfl:  .  tamsulosin (FLOMAX) 0.4 MG CAPS capsule, Take 1 capsule (0.4 mg total) by mouth daily., Disp: 90 capsule, Rfl: 3 .  traZODone (DESYREL) 50 MG tablet, Take 25 mg by mouth at bedtime., Disp: , Rfl:  .  warfarin (COUMADIN) 5 MG tablet, Take 1 tablet (5 mg total) by mouth daily., Disp: 30 tablet, Rfl: 3 .  warfarin (COUMADIN) 7.5 MG tablet, TAKE 1 TABLET BY MOUTH  DAILY, Disp: 90 tablet, Rfl: 3   No Known Allergies  ROS Review of Systems  Constitutional: Negative.   HENT: Negative.   Respiratory: Negative.   Cardiovascular: Positive for palpitations.  Genitourinary: Negative.    Musculoskeletal: Negative.   Psychiatric/Behavioral: Negative.      OBJECTIVE:    Physical Exam HENT:     Head: Normocephalic and atraumatic.  Cardiovascular:     Rate and Rhythm: Rhythm irregular.  Psychiatric:        Mood and Affect: Mood normal.     BP (!) 143/84   Pulse 64   Ht 5\' 7"  (1.702 m)   Wt 166 lb 3.2 oz (75.4 kg)   BMI 26.03 kg/m  Wt Readings from Last 3 Encounters:  01/13/21 166 lb 3.2 oz (75.4 kg)  12/30/20 170 lb 11.2 oz (77.4 kg)  11/29/20 169 lb 11.2 oz (77 kg)    Health Maintenance Due  Topic Date Due  . TETANUS/TDAP  Never done  . PNA vac Low Risk Adult (1 of 2 - PCV13) Never done  . COVID-19 Vaccine (3 - Booster for Moderna series) 03/12/2020    There are no preventive care reminders to display for this patient.  CBC Latest Ref Rng & Units 08/25/2020 07/02/2019 06/18/2017  WBC 3.8 - 10.8 Thousand/uL 9.7 8.5 7.1  Hemoglobin 13.2 - 17.1 g/dL 14.0 14.5 13.6  Hematocrit 38.5 - 50.0 % 41.1 42.2 39.9(L)  Platelets 140 - 400 Thousand/uL 175 163 156   CMP Latest Ref Rng & Units 08/25/2020 07/02/2019 06/18/2017  Glucose 65 - 99 mg/dL 98 94 140(H)  BUN 7 - 25 mg/dL 18 20 27(H)  Creatinine 0.70 - 1.11 mg/dL 1.21(H) 1.25(H) 1.61(H)  Sodium 135 - 146 mmol/L 142 141 138  Potassium 3.5 - 5.3 mmol/L 4.6 3.9 4.1  Chloride 98 - 110 mmol/L 106 105 106  CO2 20 - 32 mmol/L 26 27 24   Calcium 8.6 - 10.3 mg/dL 9.9 9.2 9.1  Total Protein 6.1 - 8.1 g/dL 7.2 - -  Total Bilirubin 0.2 - 1.2 mg/dL 0.9 - -  AST 10 - 35 U/L 25 - -  ALT 9 - 46 U/L 25 - -    Lab Results  Component Value Date   TSH 1.77 08/25/2020   Lab Results  Component Value Date   ANIONGAP 9 07/02/2019   No results found for: CHOL, HDL, LDLCALC, CHOLHDL No results found for: TRIG Lab Results  Component Value Date   HGBA1C 6.5 (H) 08/25/2020      ASSESSMENT & PLAN:   Problem List Items Addressed This Visit      Cardiovascular and Mediastinum   Atrial fibrillation ([place]) - Primary    Relevant Orders   POCT INR (Completed)     Other  Encounter for current long-term use of anticoagulants    INR 1.7 today, will put him back on 7.5 mg daily. FU 1 month.          No orders of the defined types were placed in this encounter.     Follow-up: No follow-ups on file.    Beckie Salts, [place] [address], [place], [place] [address]

## 2021-01-13 NOTE — Assessment & Plan Note (Signed)
INR 1.7 today, will put him back on 7.5 mg daily. FU 1 month.

## 2021-02-11 DIAGNOSIS — I10 Essential (primary) hypertension: Secondary | ICD-10-CM | POA: Diagnosis not present

## 2021-02-11 DIAGNOSIS — I2510 Atherosclerotic heart disease of native coronary artery without angina pectoris: Secondary | ICD-10-CM | POA: Diagnosis not present

## 2021-02-11 DIAGNOSIS — I6389 Other cerebral infarction: Secondary | ICD-10-CM | POA: Diagnosis not present

## 2021-02-17 ENCOUNTER — Other Ambulatory Visit: Payer: Self-pay

## 2021-02-17 ENCOUNTER — Ambulatory Visit (INDEPENDENT_AMBULATORY_CARE_PROVIDER_SITE_OTHER): Payer: Medicare Other | Admitting: Family Medicine

## 2021-02-17 ENCOUNTER — Encounter: Payer: Self-pay | Admitting: Family Medicine

## 2021-02-17 VITALS — BP 119/69 | HR 61 | Ht 67.0 in | Wt 166.5 lb

## 2021-02-17 DIAGNOSIS — I4811 Longstanding persistent atrial fibrillation: Secondary | ICD-10-CM | POA: Diagnosis not present

## 2021-02-17 LAB — POCT INR: INR: 2.8 (ref 2.0–3.0)

## 2021-02-18 DIAGNOSIS — I4891 Unspecified atrial fibrillation: Secondary | ICD-10-CM | POA: Diagnosis not present

## 2021-02-18 DIAGNOSIS — I2510 Atherosclerotic heart disease of native coronary artery without angina pectoris: Secondary | ICD-10-CM | POA: Diagnosis not present

## 2021-02-18 DIAGNOSIS — E7849 Other hyperlipidemia: Secondary | ICD-10-CM | POA: Diagnosis not present

## 2021-02-18 DIAGNOSIS — I10 Essential (primary) hypertension: Secondary | ICD-10-CM | POA: Diagnosis not present

## 2021-03-21 ENCOUNTER — Other Ambulatory Visit: Payer: Self-pay

## 2021-03-21 ENCOUNTER — Encounter: Payer: Self-pay | Admitting: Internal Medicine

## 2021-03-21 ENCOUNTER — Ambulatory Visit (INDEPENDENT_AMBULATORY_CARE_PROVIDER_SITE_OTHER): Payer: Medicare Other | Admitting: Internal Medicine

## 2021-03-21 VITALS — BP 135/75 | HR 81 | Ht 67.0 in | Wt 167.7 lb

## 2021-03-21 DIAGNOSIS — I10 Essential (primary) hypertension: Secondary | ICD-10-CM

## 2021-03-21 DIAGNOSIS — M519 Unspecified thoracic, thoracolumbar and lumbosacral intervertebral disc disorder: Secondary | ICD-10-CM | POA: Diagnosis not present

## 2021-03-21 DIAGNOSIS — I4811 Longstanding persistent atrial fibrillation: Secondary | ICD-10-CM | POA: Diagnosis not present

## 2021-03-21 DIAGNOSIS — G20 Parkinson's disease: Secondary | ICD-10-CM

## 2021-03-21 LAB — POCT INR: INR: 2.4 (ref 2.0–3.0)

## 2021-03-21 NOTE — Assessment & Plan Note (Signed)
Stable

## 2021-03-21 NOTE — Assessment & Plan Note (Signed)
Stable at the present time. 

## 2021-03-21 NOTE — Assessment & Plan Note (Signed)
-   Patient's back pain is under control with medication.  - Encouraged the patient to stretch or do yoga as able to help with back pain Refer to orthopedics for further evaluation of the lumbosacral pain and persistent symptoms of the lower back pain.

## 2021-03-21 NOTE — Assessment & Plan Note (Signed)

## 2021-03-21 NOTE — Progress Notes (Signed)
Established Patient Office Visit  Subjective:  Patient ID: Kyle Santos, male    DOB: 1936/02/25  Age: 85 y.o. MRN: [id number]  CC:  Chief Complaint  Patient presents with   Atrial Fibrillation    Atrial Fibrillation Past medical history includes atrial fibrillation.   Leah A Randell Patient presents for protime check  Past Medical History:  Diagnosis Date   A-fib [facility])    Arthritis    BPH (benign prostatic hyperplasia)    Depression    Heart murmur    HOH (hard of hearing)    Hypercholesteremia    Hypertension    Loss of equilibrium    being evaluated at this time.   Pneumonia    history of   Sleep apnea     Past Surgical History:  Procedure Laterality Date   CATARACT EXTRACTION W/PHACO Right 01/09/2017   Procedure: CATARACT EXTRACTION PHACO AND INTRAOCULAR LENS PLACEMENT (IOC);  Surgeon: Birder Robson, MD;  Location: [facility] ORS;  Service: Ophthalmology;  Laterality: Right;  [place] 01:06 AP% 19.7 CDE 13.18 Fluid pack lot # [id number]   CATARACT EXTRACTION W/PHACO Left 08/07/2017   Procedure: CATARACT EXTRACTION PHACO AND INTRAOCULAR LENS PLACEMENT (IOC)-LEFT;  Surgeon: Birder Robson, MD;  Location: [facility] ORS;  Service: Ophthalmology;  Laterality: Left;  [place]  00:43 AP% 15.0 CDE 6.50 Fluid pack lot # [id number]   COLONOSCOPY     JOINT REPLACEMENT     knee   JOINT REPLACEMENT     shoulder    Family History  Problem Relation Age of Onset   Heart disease Father     Social History   Socioeconomic History   Marital status: Married    Spouse name: Not on file   Number of children: Not on file   Years of education: Not on file   Highest education level: Not on file  Occupational History   Not on file  Tobacco Use   Smoking status: Former    Types: Cigarettes    Quit date: 08/22/1943    Years since quitting: 77.6   Smokeless tobacco: Former  Substance and Sexual Activity   Alcohol use: Yes    Alcohol/week: 0.0 standard drinks    Comment: occasional   Drug use:  No   Sexual activity: Not Currently    Birth control/protection: None  Other Topics Concern   Not on file  Social History Narrative   Drinks about 2 cups of caffeine.   Social Determinants of Health   Financial Resource Strain: Not on file  Food Insecurity: Not on file  Transportation Needs: Not on file  Physical Activity: Not on file  Stress: Not on file  Social Connections: Not on file  Intimate Partner Violence: Not on file     Current Outpatient Medications:    acyclovir (ZOVIRAX) 800 MG tablet, Take 800 mg by mouth 4 (four) times daily., Disp: , Rfl:    ampicillin (PRINCIPEN) 500 MG capsule, 4 capsules 1 hour before dental procedure, Disp: 4 capsule, Rfl: 0   atorvastatin (LIPITOR) 80 MG tablet, Take 25 mg by mouth daily., Disp: , Rfl:    Cascara Sagrada 450 MG CAPS, Take 450 mg by mouth 2 (two) times daily., Disp: , Rfl:    Cholecalciferol (VITAMIN D3) 1000 units CAPS, Take 1,000 Units by mouth daily at 3 pm., Disp: , Rfl:    ibuprofen (ADVIL,MOTRIN) 200 MG tablet, Take 400 mg by mouth every 6 (six) hours as needed for headache or moderate pain., Disp: , Rfl:  lisinopril (ZESTRIL) 20 MG tablet, Take 20 mg by mouth daily., Disp: , Rfl:    Melatonin 5 MG TABS, Take 5 mg by mouth at bedtime. , Disp: , Rfl:    tamsulosin (FLOMAX) 0.4 MG CAPS capsule, Take 1 capsule (0.4 mg total) by mouth daily., Disp: 90 capsule, Rfl: 3   traZODone (DESYREL) 50 MG tablet, Take 25 mg by mouth at bedtime., Disp: , Rfl:    warfarin (COUMADIN) 5 MG tablet, Take 1 tablet (5 mg total) by mouth daily., Disp: 30 tablet, Rfl: 3   warfarin (COUMADIN) 7.5 MG tablet, TAKE 1 TABLET BY MOUTH  DAILY, Disp: 90 tablet, Rfl: 3   B Complex Vitamins (B COMPLEX-B12 PO), Take 1 tablet by mouth daily. (Patient not taking: Reported on 03/21/2021), Disp: , Rfl:    No Known Allergies  ROS Review of Systems  Constitutional: Negative.   HENT: Negative.    Eyes: Negative.   Respiratory: Negative.    Cardiovascular:  Negative.   Gastrointestinal: Negative.   Endocrine: Negative.   Genitourinary: Negative.   Musculoskeletal: Negative.   Skin: Negative.   Allergic/Immunologic: Negative.   Neurological: Negative.   Hematological: Negative.   Psychiatric/Behavioral: Negative.    All other systems reviewed and are negative.    Objective:    Physical Exam Vitals reviewed.  Constitutional:      Appearance: Normal appearance.  HENT:     Mouth/Throat:     Mouth: Mucous membranes are moist.  Eyes:     Pupils: Pupils are equal, round, and reactive to light.  Neck:     Vascular: No carotid bruit.  Cardiovascular:     Rate and Rhythm: Normal rate and regular rhythm.     Pulses: Normal pulses.     Heart sounds: Normal heart sounds.  Pulmonary:     Effort: Pulmonary effort is normal.     Breath sounds: Normal breath sounds.  Abdominal:     General: Bowel sounds are normal.     Palpations: Abdomen is soft. There is no hepatomegaly, splenomegaly or mass.     Tenderness: There is no abdominal tenderness.     Hernia: No hernia is present.  Musculoskeletal:       Arms:     Cervical back: Neck supple.     Right lower leg: No edema.     Left lower leg: No edema.     Comments: Lumbosacral sprain, incomplete emptying of the bladder, patient be referred to orthopedics and neurology.  Skin:    Findings: No rash.  Neurological:     Mental Status: He is alert and oriented to person, place, and time.     Motor: No weakness.  Psychiatric:        Mood and Affect: Mood normal.        Behavior: Behavior normal.    BP 135/75   Pulse 81   Ht '5\' 7"'$  (1.702 m)   Wt 167 lb 11.2 oz (76.1 kg)   BMI 26.27 kg/m  Wt Readings from Last 3 Encounters:  03/21/21 167 lb 11.2 oz (76.1 kg)  02/17/21 166 lb 8 oz (75.5 kg)  01/13/21 166 lb 3.2 oz (75.4 kg)     Health Maintenance Due  Topic Date Due   Zoster Vaccines- Shingrix (1 of 2) Never done   INFLUENZA VACCINE  03/21/2021    There are no preventive care  reminders to display for this patient.  Lab Results  Component Value Date   TSH 1.77 08/25/2020  Lab Results  Component Value Date   WBC 9.7 08/25/2020   HGB 14.0 08/25/2020   HCT 41.1 08/25/2020   MCV 91.5 08/25/2020   PLT 175 08/25/2020   Lab Results  Component Value Date   NA 142 08/25/2020   K 4.6 08/25/2020   CO2 26 08/25/2020   GLUCOSE 98 08/25/2020   BUN 18 08/25/2020   CREATININE 1.21 (H) 08/25/2020   BILITOT 0.9 08/25/2020   AST 25 08/25/2020   ALT 25 08/25/2020   PROT 7.2 08/25/2020   CALCIUM 9.9 08/25/2020   ANIONGAP 9 07/02/2019   No results found for: CHOL No results found for: HDL No results found for: LDLCALC No results found for: TRIG No results found for: CHOLHDL Lab Results  Component Value Date   HGBA1C 6.5 (H) 08/25/2020      Assessment & Plan:   Problem List Items Addressed This Visit       Cardiovascular and Mediastinum   Atrial fibrillation ([place]) - Primary    Stable at the present time       Relevant Orders   POCT INR (Completed)   Essential hypertension     Patient denies any chest pain or shortness of breath there is no history of palpitation or paroxysmal nocturnal dyspnea   patient was advised to follow low-salt low-cholesterol diet    ideally I want to keep systolic blood pressure below 130 mmHg, patient was asked to check blood pressure one times a week and give me a report on that.  Patient will be follow-up in 3 months  or earlier as needed, patient will call me back for any change in the cardiovascular symptoms Patient was advised to buy a book from local bookstore concerning blood pressure and read several chapters  every day.  This will be supplemented by some of the material we will give him from the office.  Patient should also utilize other resources like YouTube and Internet to learn more about the blood pressure and the diet.         Nervous and Auditory   Primary parkinsonism ([place])    Stable          Musculoskeletal and Integument   Lumbosacral disc disease    - Patient's back pain is under control with medication.  - Encouraged the patient to stretch or do yoga as able to help with back pain Refer to orthopedics for further evaluation of the lumbosacral pain and persistent symptoms of the lower back pain.        No orders of the defined types were placed in this encounter.   Follow-up: No follow-ups on file.    Cletis [place], MD

## 2021-03-23 NOTE — Addendum Note (Signed)
Addended by: Lacretia Nicks L on: 03/23/2021 11:22 AM   Modules accepted: Orders

## 2021-03-27 ENCOUNTER — Encounter: Payer: Self-pay | Admitting: Family Medicine

## 2021-03-27 NOTE — Assessment & Plan Note (Signed)
INR wnl, pt doing well, rate wnl

## 2021-03-27 NOTE — Progress Notes (Signed)
Established Patient Office Visit  SUBJECTIVE:  Subjective  Patient ID: Kyle Santos, male    DOB: 07/16/1936  Age: 85 y.o. MRN: [id number]  CC:  Chief Complaint  Patient presents with   Atrial Fibrillation    1 month INR check    HPI Kyle Santos is a 85 y.o. male presenting today for     Past Medical History:  Diagnosis Date   A-fib ([place])    Arthritis    BPH (benign prostatic hyperplasia)    Depression    Heart murmur    HOH (hard of hearing)    Hypercholesteremia    Hypertension    Loss of equilibrium    being evaluated at this time.   Pneumonia    history of   Sleep apnea     Past Surgical History:  Procedure Laterality Date   CATARACT EXTRACTION W/PHACO Right 01/09/2017   Procedure: CATARACT EXTRACTION PHACO AND INTRAOCULAR LENS PLACEMENT (IOC);  Surgeon: Birder Robson, MD;  Location: [facility] ORS;  Service: Ophthalmology;  Laterality: Right;  [place] 01:06 AP% 19.7 CDE 13.18 Fluid pack lot # [id number]   CATARACT EXTRACTION W/PHACO Left 08/07/2017   Procedure: CATARACT EXTRACTION PHACO AND INTRAOCULAR LENS PLACEMENT (IOC)-LEFT;  Surgeon: Birder Robson, MD;  Location: [facility] ORS;  Service: Ophthalmology;  Laterality: Left;  [place]  00:43 AP% 15.0 CDE 6.50 Fluid pack lot # [id number]   COLONOSCOPY     JOINT REPLACEMENT     knee   JOINT REPLACEMENT     shoulder    Family History  Problem Relation Age of Onset   Heart disease Father     Social History   Socioeconomic History   Marital status: Married    Spouse name: Not on file   Number of children: Not on file   Years of education: Not on file   Highest education level: Not on file  Occupational History   Not on file  Tobacco Use   Smoking status: Former    Types: Cigarettes    Quit date: 08/22/1943    Years since quitting: 77.6   Smokeless tobacco: Former  Substance and Sexual Activity   Alcohol use: Yes    Alcohol/week: 0.0 standard drinks    Comment: occasional   Drug use: No   Sexual  activity: Not Currently    Birth control/protection: None  Other Topics Concern   Not on file  Social History Narrative   Drinks about 2 cups of caffeine.   Social Determinants of Health   Financial Resource Strain: Not on file  Food Insecurity: Not on file  Transportation Needs: Not on file  Physical Activity: Not on file  Stress: Not on file  Social Connections: Not on file  Intimate Partner Violence: Not on file     Current Outpatient Medications:    acyclovir (ZOVIRAX) 800 MG tablet, Take 800 mg by mouth 4 (four) times daily., Disp: , Rfl:    ampicillin (PRINCIPEN) 500 MG capsule, 4 capsules 1 hour before dental procedure, Disp: 4 capsule, Rfl: 0   atorvastatin (LIPITOR) 80 MG tablet, Take 25 mg by mouth daily., Disp: , Rfl:    B Complex Vitamins (B COMPLEX-B12 PO), Take 1 tablet by mouth daily. (Patient not taking: Reported on 03/21/2021), Disp: , Rfl:    Cascara Sagrada 450 MG CAPS, Take 450 mg by mouth 2 (two) times daily., Disp: , Rfl:    Cholecalciferol (VITAMIN D3) 1000 units CAPS, Take 1,000 Units by mouth daily at 3 pm.,  Disp: , Rfl:    ibuprofen (ADVIL,MOTRIN) 200 MG tablet, Take 400 mg by mouth every 6 (six) hours as needed for headache or moderate pain., Disp: , Rfl:    lisinopril (ZESTRIL) 20 MG tablet, Take 20 mg by mouth daily., Disp: , Rfl:    Melatonin 5 MG TABS, Take 5 mg by mouth at bedtime. , Disp: , Rfl:    tamsulosin (FLOMAX) 0.4 MG CAPS capsule, Take 1 capsule (0.4 mg total) by mouth daily., Disp: 90 capsule, Rfl: 3   traZODone (DESYREL) 50 MG tablet, Take 25 mg by mouth at bedtime., Disp: , Rfl:    warfarin (COUMADIN) 5 MG tablet, Take 1 tablet (5 mg total) by mouth daily., Disp: 30 tablet, Rfl: 3   warfarin (COUMADIN) 7.5 MG tablet, TAKE 1 TABLET BY MOUTH  DAILY, Disp: 90 tablet, Rfl: 3   No Known Allergies  ROS Review of Systems  Constitutional: Negative.   HENT: Negative.    Respiratory: Negative.    Cardiovascular: Negative.    Psychiatric/Behavioral: Negative.      OBJECTIVE:    Physical Exam HENT:     Right Ear: Tympanic membrane normal.     Mouth/Throat:     Mouth: Mucous membranes are moist.  Cardiovascular:     Rate and Rhythm: Rhythm irregular.  Musculoskeletal:        General: Normal range of motion.  Neurological:     Mental Status: He is alert.  Psychiatric:        Mood and Affect: Mood normal.    BP 119/69   Pulse 61   Ht '5\' 7"'$  (1.702 m)   Wt 166 lb 8 oz (75.5 kg)   BMI 26.08 kg/m  Wt Readings from Last 3 Encounters:  03/21/21 167 lb 11.2 oz (76.1 kg)  02/17/21 166 lb 8 oz (75.5 kg)  01/13/21 166 lb 3.2 oz (75.4 kg)    Health Maintenance Due  Topic Date Due   Zoster Vaccines- Shingrix (1 of 2) Never done   INFLUENZA VACCINE  03/21/2021    There are no preventive care reminders to display for this patient.  CBC Latest Ref Rng & Units 08/25/2020 07/02/2019 06/18/2017  WBC 3.8 - 10.8 Thousand/uL 9.7 8.5 7.1  Hemoglobin 13.2 - 17.1 g/dL 14.0 14.5 13.6  Hematocrit 38.5 - 50.0 % 41.1 42.2 39.9(L)  Platelets 140 - 400 Thousand/uL 175 163 156   CMP Latest Ref Rng & Units 08/25/2020 07/02/2019 06/18/2017  Glucose 65 - 99 mg/dL 98 94 140(H)  BUN 7 - 25 mg/dL 18 20 27(H)  Creatinine 0.70 - 1.11 mg/dL 1.21(H) 1.25(H) 1.61(H)  Sodium 135 - 146 mmol/L 142 141 138  Potassium 3.5 - 5.3 mmol/L 4.6 3.9 4.1  Chloride 98 - 110 mmol/L 106 105 106  CO2 20 - 32 mmol/L '26 27 24  '$ Calcium 8.6 - 10.3 mg/dL 9.9 9.2 9.1  Total Protein 6.1 - 8.1 g/dL 7.2 - -  Total Bilirubin 0.2 - 1.2 mg/dL 0.9 - -  AST 10 - 35 U/L 25 - -  ALT 9 - 46 U/L 25 - -    Lab Results  Component Value Date   TSH 1.77 08/25/2020   Lab Results  Component Value Date   ANIONGAP 9 07/02/2019   No results found for: CHOL, HDL, LDLCALC, CHOLHDL No results found for: TRIG Lab Results  Component Value Date   HGBA1C 6.5 (H) 08/25/2020      ASSESSMENT & PLAN:   Problem List Items Addressed This  Visit        Cardiovascular and Mediastinum   Atrial fibrillation ([place]) - Primary    INR wnl, pt doing well, rate wnl        Relevant Orders   POCT INR (Completed)    No orders of the defined types were placed in this encounter.     Follow-up: No follow-ups on file.    Beckie Salts, [place] [address] [place], [place] [address]

## 2021-03-28 DIAGNOSIS — M25512 Pain in left shoulder: Secondary | ICD-10-CM | POA: Diagnosis not present

## 2021-04-04 ENCOUNTER — Other Ambulatory Visit: Payer: Self-pay | Admitting: Physician Assistant

## 2021-04-04 DIAGNOSIS — M25512 Pain in left shoulder: Secondary | ICD-10-CM

## 2021-04-07 ENCOUNTER — Ambulatory Visit
Admission: RE | Admit: 2021-04-07 | Discharge: 2021-04-07 | Disposition: A | Discharge status: Home / Self Care | Payer: Medicare Other | Attending: Internal Medicine | Admitting: Internal Medicine

## 2021-04-07 ENCOUNTER — Ambulatory Visit
Admission: RE | Admit: 2021-04-07 | Discharge: 2021-04-07 | Disposition: A | Discharge status: Home / Self Care | Payer: Medicare Other | Source: Ambulatory Visit | Attending: Physician Assistant | Admitting: Physician Assistant

## 2021-04-07 ENCOUNTER — Other Ambulatory Visit: Payer: Self-pay

## 2021-04-07 ENCOUNTER — Ambulatory Visit
Admission: RE | Admit: 2021-04-07 | Discharge: 2021-04-07 | Disposition: A | Discharge status: Home / Self Care | Payer: Medicare Other | Source: Ambulatory Visit | Attending: Internal Medicine | Admitting: Internal Medicine

## 2021-04-07 DIAGNOSIS — M519 Unspecified thoracic, thoracolumbar and lumbosacral intervertebral disc disorder: Secondary | ICD-10-CM

## 2021-04-07 DIAGNOSIS — S46112A Strain of muscle, fascia and tendon of long head of biceps, left arm, initial encounter: Secondary | ICD-10-CM | POA: Diagnosis not present

## 2021-04-07 DIAGNOSIS — I10 Essential (primary) hypertension: Secondary | ICD-10-CM | POA: Diagnosis not present

## 2021-04-07 DIAGNOSIS — M439 Deforming dorsopathy, unspecified: Secondary | ICD-10-CM | POA: Diagnosis not present

## 2021-04-07 DIAGNOSIS — Z01818 Encounter for other preprocedural examination: Secondary | ICD-10-CM | POA: Diagnosis not present

## 2021-04-07 DIAGNOSIS — R0602 Shortness of breath: Secondary | ICD-10-CM | POA: Diagnosis not present

## 2021-04-07 DIAGNOSIS — S43432A Superior glenoid labrum lesion of left shoulder, initial encounter: Secondary | ICD-10-CM | POA: Diagnosis not present

## 2021-04-07 DIAGNOSIS — M25512 Pain in left shoulder: Secondary | ICD-10-CM | POA: Insufficient documentation

## 2021-04-07 DIAGNOSIS — I6389 Other cerebral infarction: Secondary | ICD-10-CM | POA: Diagnosis not present

## 2021-04-07 DIAGNOSIS — I2510 Atherosclerotic heart disease of native coronary artery without angina pectoris: Secondary | ICD-10-CM | POA: Diagnosis not present

## 2021-04-07 DIAGNOSIS — M47816 Spondylosis without myelopathy or radiculopathy, lumbar region: Secondary | ICD-10-CM | POA: Diagnosis not present

## 2021-04-07 DIAGNOSIS — M545 Low back pain, unspecified: Secondary | ICD-10-CM | POA: Diagnosis not present

## 2021-04-11 DIAGNOSIS — M19012 Primary osteoarthritis, left shoulder: Secondary | ICD-10-CM | POA: Diagnosis not present

## 2021-04-14 DIAGNOSIS — I10 Essential (primary) hypertension: Secondary | ICD-10-CM | POA: Diagnosis not present

## 2021-04-18 ENCOUNTER — Other Ambulatory Visit: Payer: Self-pay

## 2021-04-26 ENCOUNTER — Encounter: Payer: Self-pay | Admitting: Internal Medicine

## 2021-04-26 ENCOUNTER — Other Ambulatory Visit: Payer: Self-pay

## 2021-04-26 ENCOUNTER — Ambulatory Visit (INDEPENDENT_AMBULATORY_CARE_PROVIDER_SITE_OTHER): Payer: Medicare Other | Admitting: Internal Medicine

## 2021-04-26 VITALS — BP 149/84 | HR 59 | Ht 67.0 in | Wt 164.6 lb

## 2021-04-26 DIAGNOSIS — I4891 Unspecified atrial fibrillation: Secondary | ICD-10-CM | POA: Diagnosis not present

## 2021-04-26 DIAGNOSIS — G4733 Obstructive sleep apnea (adult) (pediatric): Secondary | ICD-10-CM | POA: Diagnosis not present

## 2021-04-26 DIAGNOSIS — Z9989 Dependence on other enabling machines and devices: Secondary | ICD-10-CM

## 2021-04-26 DIAGNOSIS — G250 Essential tremor: Secondary | ICD-10-CM | POA: Diagnosis not present

## 2021-04-26 DIAGNOSIS — I10 Essential (primary) hypertension: Secondary | ICD-10-CM

## 2021-04-26 LAB — POCT INR: INR: 2.2 (ref 2.0–3.0)

## 2021-04-26 MED ORDER — AMPICILLIN 500 MG PO CAPS: ORAL_CAPSULE | ORAL | 0 refills | Status: DC

## 2021-04-28 ENCOUNTER — Encounter: Payer: Self-pay | Admitting: Internal Medicine

## 2021-04-28 NOTE — Progress Notes (Signed)
Established Patient Office Visit  Subjective:  Patient ID: Kyle Santos, male    DOB: Dec 19, 1935  Age: 85 y.o. MRN: [id number]  CC:  Chief Complaint  Patient presents with   Atrial Fibrillation    Atrial Fibrillation Past medical history includes atrial fibrillation.   Betty A [place] presents for a general checkup.,  He denies any chest pain or shortness of breath.  Shoulders are stable after surgery.  He does not smoke does not drink.  Past Medical History:  Diagnosis Date   A-fib [facility])    Arthritis    BPH (benign prostatic hyperplasia)    Depression    Heart murmur    HOH (hard of hearing)    Hypercholesteremia    Hypertension    Loss of equilibrium    being evaluated at this time.   Pneumonia    history of   Sleep apnea     Past Surgical History:  Procedure Laterality Date   CATARACT EXTRACTION W/PHACO Right 01/09/2017   Procedure: CATARACT EXTRACTION PHACO AND INTRAOCULAR LENS PLACEMENT (IOC);  Surgeon: Birder Robson, MD;  Location: [facility] ORS;  Service: Ophthalmology;  Laterality: Right;  [place] 01:06 AP% 19.7 CDE 13.18 Fluid pack lot # [id number]   CATARACT EXTRACTION W/PHACO Left 08/07/2017   Procedure: CATARACT EXTRACTION PHACO AND INTRAOCULAR LENS PLACEMENT (IOC)-LEFT;  Surgeon: Birder Robson, MD;  Location: [facility] ORS;  Service: Ophthalmology;  Laterality: Left;  [place]  00:43 AP% 15.0 CDE 6.50 Fluid pack lot # [id number]   COLONOSCOPY     JOINT REPLACEMENT     knee   JOINT REPLACEMENT     shoulder    Family History  Problem Relation Age of Onset   Heart disease Father     Social History   Socioeconomic History   Marital status: Married    Spouse name: Not on file   Number of children: Not on file   Years of education: Not on file   Highest education level: Not on file  Occupational History   Not on file  Tobacco Use   Smoking status: Former    Types: Cigarettes    Quit date: 08/22/1943    Years since quitting: 77.7   Smokeless tobacco:  Former  Substance and Sexual Activity   Alcohol use: Yes    Alcohol/week: 0.0 standard drinks    Comment: occasional   Drug use: No   Sexual activity: Not Currently    Birth control/protection: None  Other Topics Concern   Not on file  Social History Narrative   Drinks about 2 cups of caffeine.   Social Determinants of Health   Financial Resource Strain: Not on file  Food Insecurity: Not on file  Transportation Needs: Not on file  Physical Activity: Not on file  Stress: Not on file  Social Connections: Not on file  Intimate Partner Violence: Not on file     Current Outpatient Medications:    acyclovir (ZOVIRAX) 800 MG tablet, Take 800 mg by mouth 4 (four) times daily., Disp: , Rfl:    atorvastatin (LIPITOR) 80 MG tablet, Take 25 mg by mouth daily., Disp: , Rfl:    B Complex Vitamins (B COMPLEX-B12 PO), Take 1 tablet by mouth daily., Disp: , Rfl:    Cascara [place] 450 MG CAPS, Take 450 mg by mouth 2 (two) times daily., Disp: , Rfl:    Cholecalciferol (VITAMIN D3) 1000 units CAPS, Take 1,000 Units by mouth daily at 3 pm., Disp: , Rfl:    ibuprofen (  ADVIL,MOTRIN) 200 MG tablet, Take 400 mg by mouth every 6 (six) hours as needed for headache or moderate pain., Disp: , Rfl:    lisinopril (ZESTRIL) 20 MG tablet, Take 20 mg by mouth daily., Disp: , Rfl:    Melatonin 5 MG TABS, Take 5 mg by mouth at bedtime. , Disp: , Rfl:    tamsulosin (FLOMAX) 0.4 MG CAPS capsule, Take 1 capsule (0.4 mg total) by mouth daily., Disp: 90 capsule, Rfl: 3   traZODone (DESYREL) 50 MG tablet, Take 25 mg by mouth at bedtime., Disp: , Rfl:    warfarin (COUMADIN) 5 MG tablet, Take 1 tablet (5 mg total) by mouth daily., Disp: 30 tablet, Rfl: 3   warfarin (COUMADIN) 7.5 MG tablet, TAKE 1 TABLET BY MOUTH  DAILY, Disp: 90 tablet, Rfl: 3   ampicillin (PRINCIPEN) 500 MG capsule, 4 capsules 1 hour before dental procedure, Disp: 4 capsule, Rfl: 0   No Known Allergies  ROS Review of Systems  Constitutional:  Negative.   HENT: Negative.    Eyes: Negative.   Respiratory: Negative.    Cardiovascular: Negative.   Gastrointestinal: Negative.   Endocrine: Negative.   Genitourinary: Negative.   Musculoskeletal: Negative.   Skin: Negative.   Allergic/Immunologic: Negative.   Neurological: Negative.   Hematological: Negative.   Psychiatric/Behavioral: Negative.    All other systems reviewed and are negative.    Objective:    Physical Exam Vitals reviewed.  Constitutional:      Appearance: Normal appearance.  HENT:     Mouth/Throat:     Mouth: Mucous membranes are moist.  Eyes:     Pupils: Pupils are equal, round, and reactive to light.  Neck:     Vascular: No carotid bruit.  Cardiovascular:     Rate and Rhythm: Normal rate and regular rhythm.     Pulses: Normal pulses.     Heart sounds: Normal heart sounds.  Pulmonary:     Effort: Pulmonary effort is normal.     Breath sounds: Normal breath sounds.  Abdominal:     General: Bowel sounds are normal.     Palpations: Abdomen is soft. There is no hepatomegaly, splenomegaly or mass.     Tenderness: There is no abdominal tenderness.     Hernia: No hernia is present.  Musculoskeletal:     Cervical back: Neck supple.     Right lower leg: No edema.     Left lower leg: No edema.  Skin:    Findings: No rash.  Neurological:     Mental Status: He is alert and oriented to person, place, and time.     Motor: No weakness.  Psychiatric:        Mood and Affect: Mood normal.        Behavior: Behavior normal.    BP (!) 149/84   Pulse (!) 59   Ht '5\' 7"'$  (1.702 m)   Wt 164 lb 9.6 oz (74.7 kg)   BMI 25.78 kg/m  Wt Readings from Last 3 Encounters:  04/26/21 164 lb 9.6 oz (74.7 kg)  03/21/21 167 lb 11.2 oz (76.1 kg)  02/17/21 166 lb 8 oz (75.5 kg)     Health Maintenance Due  Topic Date Due   Zoster Vaccines- Shingrix (1 of 2) Never done   INFLUENZA VACCINE  03/21/2021    There are no preventive care reminders to display for this  patient.  Lab Results  Component Value Date   TSH 1.77 08/25/2020   Lab Results  Component Value Date   WBC 9.7 08/25/2020   HGB 14.0 08/25/2020   HCT 41.1 08/25/2020   MCV 91.5 08/25/2020   PLT 175 08/25/2020   Lab Results  Component Value Date   NA 142 08/25/2020   K 4.6 08/25/2020   CO2 26 08/25/2020   GLUCOSE 98 08/25/2020   BUN 18 08/25/2020   CREATININE 1.21 (H) 08/25/2020   BILITOT 0.9 08/25/2020   AST 25 08/25/2020   ALT 25 08/25/2020   PROT 7.2 08/25/2020   CALCIUM 9.9 08/25/2020   ANIONGAP 9 07/02/2019   No results found for: CHOL No results found for: HDL No results found for: LDLCALC No results found for: TRIG No results found for: CHOLHDL Lab Results  Component Value Date   HGBA1C 6.5 (H) 08/25/2020      Assessment & Plan:   Problem List Items Addressed This Visit       Cardiovascular and Mediastinum   Atrial fibrillation ([place]) - Primary    He does not give any history of arrhythmia or syncope.  Denies any chest pain or paroxysmal nocturnal dyspnea.      Relevant Orders   POCT INR (Completed)   Essential hypertension    The following hypertensive lifestyle modification were recommended and discussed:  1. Limiting alcohol intake to less than 1 oz/day of ethanol:(24 oz of beer or 8 oz of wine or 2 oz of 100-proof whiskey). 2 3. Importance of regular aerobic exercise and losing weight. 4. Reduce dietary saturated fat and cholesterol intake for overall cardiovascular health. 5. Maintaining adequate dietary potassium, calcium, and magnesium intake. 6. Regular monitoring of the blood pressure. 7. Reduce sodium intake to less than 100 mmol/day (less than 2.3 gm of sodium or less than 6 gm of sodium choride)         Respiratory   OSA on CPAP    Stable at the present time        Nervous and Auditory   Tremor, essential    Stable at the present time       Meds ordered this encounter  Medications   ampicillin (PRINCIPEN) 500 MG capsule     Sig: 4 capsules 1 hour before dental procedure    Dispense:  4 capsule    Refill:  0    Follow-up: No follow-ups on file.   Pro time is stable.

## 2021-04-28 NOTE — Assessment & Plan Note (Signed)
Stable at the present time. 

## 2021-04-28 NOTE — Assessment & Plan Note (Signed)
The following hypertensive lifestyle modification were recommended and discussed:  1. Limiting alcohol intake to less than 1 oz/day of ethanol:(24 oz of beer or 8 oz of wine or 2 oz of 100-proof whiskey). 2 3. Importance of regular aerobic exercise and losing weight. 4. Reduce dietary saturated fat and cholesterol intake for overall cardiovascular health. 5. Maintaining adequate dietary potassium, calcium, and magnesium intake. 6. Regular monitoring of the blood pressure. 7. Reduce sodium intake to less than 100 mmol/day (less than 2.3 gm of sodium or less than 6 gm of sodium choride)

## 2021-04-28 NOTE — Assessment & Plan Note (Signed)
He does not give any history of arrhythmia or syncope.  Denies any chest pain or paroxysmal nocturnal dyspnea.

## 2021-05-13 ENCOUNTER — Ambulatory Visit (INDEPENDENT_AMBULATORY_CARE_PROVIDER_SITE_OTHER): Payer: Medicare Other

## 2021-05-13 DIAGNOSIS — Z0000 Encounter for general adult medical examination without abnormal findings: Secondary | ICD-10-CM | POA: Diagnosis not present

## 2021-05-13 NOTE — Progress Notes (Signed)
I have reviewed this visit and agree with the documentation.   

## 2021-05-13 NOTE — Progress Notes (Signed)
Subjective:   Kyle Santos is a 85 y.o. male who presents for Medicare Annual/Subsequent preventive examination. Visit Done by audio.  Patient Location: Home Provider Location: Home   Review of Systems    N/A       Objective:    Today's Vitals   05/13/21 1602  PainSc: 8    There is no height or weight on file to calculate BMI.  Advanced Directives 05/13/2021 07/02/2019 06/18/2017 01/09/2017  Does Patient Have a Medical Advance Directive? Yes No No No  Type of Paramedic of [place];Living will - - -  Copy of [place] in Chart? No - copy requested - - -  Would patient like information on creating a medical advance directive? - - No - Patient declined No - Patient declined    Current Medications (verified) Outpatient Encounter Medications as of 05/13/2021  Medication Sig   atorvastatin (LIPITOR) 80 MG tablet Take 25 mg by mouth daily.   B Complex Vitamins (B COMPLEX-B12 PO) Take 1 tablet by mouth daily.   Cascara Sagrada 450 MG CAPS Take 450 mg by mouth 2 (two) times daily.   Cholecalciferol (VITAMIN D3) 1000 units CAPS Take 1,000 Units by mouth daily at 3 pm.   losartan (COZAAR) 25 MG tablet Take 25 mg by mouth daily.   Melatonin 5 MG TABS Take 5 mg by mouth at bedtime.    naproxen sodium (ALEVE) 220 MG tablet Take 220 mg by mouth daily as needed.   tamsulosin (FLOMAX) 0.4 MG CAPS capsule Take 1 capsule (0.4 mg total) by mouth daily.   traZODone (DESYREL) 50 MG tablet Take 25 mg by mouth at bedtime.   warfarin (COUMADIN) 7.5 MG tablet TAKE 1 TABLET BY MOUTH  DAILY   [DISCONTINUED] acyclovir (ZOVIRAX) 800 MG tablet Take 800 mg by mouth 4 (four) times daily.   [DISCONTINUED] ibuprofen (ADVIL,MOTRIN) 200 MG tablet Take 400 mg by mouth every 6 (six) hours as needed for headache or moderate pain.   [DISCONTINUED] lisinopril (ZESTRIL) 20 MG tablet Take 20 mg by mouth daily.   [DISCONTINUED] warfarin (COUMADIN) 5 MG tablet Take 1  tablet (5 mg total) by mouth daily.   No facility-administered encounter medications on file as of 05/13/2021.    Allergies (verified) Patient has no known allergies.   History: Past Medical History:  Diagnosis Date   A-fib ([place])    Arthritis    BPH (benign prostatic hyperplasia)    Depression    Heart murmur    HOH (hard of hearing)    Hypercholesteremia    Hypertension    Loss of equilibrium    being evaluated at this time.   Pneumonia    history of   Sleep apnea    Past Surgical History:  Procedure Laterality Date   CATARACT EXTRACTION W/PHACO Right 01/09/2017   Procedure: CATARACT EXTRACTION PHACO AND INTRAOCULAR LENS PLACEMENT (IOC);  Surgeon: Birder Robson, MD;  Location: [facility] ORS;  Service: Ophthalmology;  Laterality: Right;  [place] 01:06 AP% 19.7 CDE 13.18 Fluid pack lot # [id number]   CATARACT EXTRACTION W/PHACO Left 08/07/2017   Procedure: CATARACT EXTRACTION PHACO AND INTRAOCULAR LENS PLACEMENT (IOC)-LEFT;  Surgeon: Birder Robson, MD;  Location: [facility] ORS;  Service: Ophthalmology;  Laterality: Left;  [place]  00:43 AP% 15.0 CDE 6.50 Fluid pack lot # [id number]   COLONOSCOPY     JOINT REPLACEMENT     knee   JOINT REPLACEMENT     shoulder   Family History  Problem Relation Age of Onset   Heart disease Mother    Heart disease Father    Social History   Socioeconomic History   Marital status: Married    Spouse name: [name]   Number of children: 3   Years of education: College Degree   Highest education level: Bachelor's degree (e.g., BA, AB, BS)  Occupational History   Occupation: Retired  Tobacco Use   Smoking status: Former    Packs/day: 0.50    Years: 7.00    Pack years: 3.50    Types: Cigarettes    Quit date: 08/22/1963    Years since quitting: 57.7   Smokeless tobacco: Never  Vaping Use   Vaping Use: Never used  Substance and Sexual Activity   Alcohol use: Yes    Alcohol/week: 0.0 standard drinks    Comment: occasional   Drug use: No   Sexual  activity: Not Currently    Birth control/protection: None  Other Topics Concern   Not on file  Social History Narrative   Drinks about 2 cups of caffeine.   Social Determinants of Health   Financial Resource Strain: Medium Risk   Difficulty of Paying Living Expenses: Somewhat hard  Food Insecurity: No Food Insecurity   Worried About Charity fundraiser in the Last Year: Never true   Ran Out of Food in the Last Year: Never true  Transportation Needs: No Transportation Needs   Lack of Transportation (Medical): No   Lack of Transportation (Non-Medical): No  Physical Activity: Sufficiently Active   Days of Exercise per Week: 3 days   Minutes of Exercise per Session: 150+ min  Stress: Not on file  Social Connections: Socially Integrated   Frequency of Communication with Friends and Family: More than three times a week   Frequency of Social Gatherings with Friends and Family: Once a week   Attends Religious Services: More than 4 times per year   Active Member of [employer] or Organizations: Yes   Attends Music therapist: More than 4 times per year   Marital Status: Married    Tobacco Counseling Counseling given: Not Answered   Clinical Intake:  Pre-visit preparation completed: Yes  Pain : 0-10 Pain Score: 8  Pain Type: Chronic pain Pain Location: Shoulder Pain Orientation: Left Pain Descriptors / Indicators: Stabbing Pain Onset: More than a month ago Pain Frequency: Constant Pain Relieving Factors: Pt takes Naproxen as needed Effect of Pain on Daily Activities: Difficulty when reaching back or up  Pain Relieving Factors: Pt takes Naproxen as needed  Diabetes: No  How often do you need to have someone help you when you read instructions, pamphlets, or other written materials from your doctor or pharmacy?: 2 - Rarely (depending on font) What is the last grade level you completed in school?: 4 yrs of college  Diabetic? No  Interpreter Needed?:  No  Information entered by :: Anson [place] CMA   Activities of Daily Living In your present state of health, do you have any difficulty performing the following activities: 05/13/2021 06/10/2020  Hearing? Y Y  Comment Pt does not want a referral for hearing loss -  Vision? N N  Difficulty concentrating or making decisions? N N  Walking or climbing stairs? Y Y  Comment Back pain -  Dressing or bathing? N N  Doing errands, shopping? N N  Preparing Food and eating ? N -  Using the Toilet? N -  In the past six months, have you accidently leaked  urine? N -  Do you have problems with loss of bowel control? Y -  Managing your Medications? N -  Managing your Finances? N -  Housekeeping or managing your Housekeeping? N -  Some recent data might be hidden    Patient Care Team: Cletis Athens, MD as PCP - General (Internal Medicine)  Indicate any recent Medical Services you may have received from other than [facility] providers in the past year (date may be approximate).     Assessment:   This is a routine wellness examination for [place].  Hearing/Vision screen No results found.  Dietary issues and exercise activities discussed: Current Exercise Habits: Home exercise routine, Type of exercise: Other - see comments (yard work), Time (Minutes): > 60, Intensity: Moderate   Goals Addressed   None    Depression Screen PHQ 2/9 Scores 05/13/2021 03/21/2021 04/19/2020 04/19/2020  PHQ - 2 Score 1 0 0 0  PHQ- 9 Score - - 0 -    Fall Risk Fall Risk  05/13/2021 03/21/2021 04/19/2020  Falls in the past year? 1 0 1  Number falls in past yr: 1 0 1  Injury with Fall? 0 0 0  Risk for fall due to : History of fall(s) No Fall Risks History of fall(s)  Follow up Falls evaluation completed Falls evaluation completed -    FALL RISK PREVENTION PERTAINING TO THE HOME:  Any stairs in or around the home? Yes  If so, are there any without handrails? No  Home free of loose throw rugs in walkways, pet beds,  electrical cords, etc? Yes  Adequate lighting in your home to reduce risk of falls? Yes   ASSISTIVE DEVICES UTILIZED TO PREVENT FALLS:  Life alert? No  Use of a cane, walker or w/c? Yes  (at times) Grab bars in the bathroom? Yes  Shower chair or bench in shower? Yes  Elevated toilet seat or a handicapped toilet? Yes   TIMED UP AND GO:  Was the test performed? No .  Length of time to ambulate 10 feet: 0 sec.     Cognitive Function:     6CIT Screen 05/13/2021  What Year? 0 points  What time? 0 points  Count back from 20 0 points  Months in reverse 0 points  Repeat phrase 0 points    Immunizations Immunization History  Administered Date(s) Administered   Fluad Quad(high Dose 65+) 06/10/2020   Influenza-Unspecified 06/04/2015   Moderna Sars-Covid-2 Vaccination 09/16/2019, 10/14/2019, 06/23/2020, 12/30/2020   Pneumococcal Conjugate-13 07/14/2014   Pneumococcal Polysaccharide-23 09/13/2017   Tdap 09/13/2017    TDAP status: Up to date  Flu Vaccine status: Due, Education has been provided regarding the importance of this vaccine. Advised may receive this vaccine at local pharmacy or Health Dept. Aware to provide a copy of the vaccination record if obtained from local pharmacy or Health Dept. Verbalized acceptance and understanding.  Pneumococcal vaccine status: Up to date  Covid-19 vaccine status: Completed vaccines  Qualifies for Shingles Vaccine? Yes   Zostavax completed No   Shingrix Completed?: No.    Education has been provided regarding the importance of this vaccine. Patient has been advised to call insurance company to determine out of pocket expense if they have not yet received this vaccine. Advised may also receive vaccine at local pharmacy or Health Dept. Verbalized acceptance and understanding.  Screening Tests Health Maintenance  Topic Date Due   Zoster Vaccines- Shingrix (1 of 2) Never done   INFLUENZA VACCINE  03/21/2021   TETANUS/TDAP  09/14/2027    COVID-19 Vaccine  Completed   HPV VACCINES  Aged Out    Health Maintenance  Health Maintenance Due  Topic Date Due   Zoster Vaccines- Shingrix (1 of 2) Never done   INFLUENZA VACCINE  03/21/2021    Colorectal cancer screening: No longer required.   Lung Cancer Screening: (Low Dose CT Chest recommended if Age 44-80 years, 30 pack-year currently smoking OR have quit w/in 15years.) does not qualify.   Lung Cancer Screening Referral: N/A  Additional Screening:  Hepatitis C Screening: does not qualify; Completed   Vision Screening: Recommended annual ophthalmology exams for early detection of glaucoma and other disorders of the eye. Is the patient up to date with their annual eye exam?  No  Who is the provider or what is the name of the office in which the patient attends annual eye exams? [facility] If pt is not established with a provider, would they like to be referred to a provider to establish care? No .   Dental Screening: Recommended annual dental exams for proper oral hygiene  Community Resource Referral / Chronic Care Management: CRR required this visit?  No   CCM required this visit?  No      Plan:     I have personally reviewed and noted the following in the patient's chart:   Medical and social history Use of alcohol, tobacco or illicit drugs  Current medications and supplements including opioid prescriptions. Patient is not currently taking opioid prescriptions. Functional ability and status Nutritional status Physical activity Advanced directives List of other physicians Hospitalizations, surgeries, and ER visits in previous 12 months Vitals Screenings to include cognitive, depression, and falls Referrals and appointments  In addition, I have reviewed and discussed with patient certain preventive protocols, quality metrics, and best practice recommendations. A written personalized care plan for preventive services as well as general preventive  health recommendations were provided to patient.    Mr. Strahm , Thank you for taking time to come for your Medicare Wellness Visit. I appreciate your ongoing commitment to your health goals. Please review the following plan we discussed and let me know if I can assist you in the future.   These are the goals we discussed:  Goals   None     This is a list of the screening recommended for you and due dates:  Health Maintenance  Topic Date Due   Zoster (Shingles) Vaccine (1 of 2) Never done   Flu Shot  03/21/2021   Tetanus Vaccine  09/14/2027   COVID-19 Vaccine  Completed   HPV Vaccine  Aged [address] [place], [place]   05/13/2021   Nurse Notes: Patient is up to date on all care gaps besides flu and shingles vaccine. Mr Delucia plans on getting his flu shot at his next office visit on 05/25/2021. A copy of patient's Medical Advance Directives was requested, patient states he will bring in a copy for his medical chart.

## 2021-05-19 ENCOUNTER — Ambulatory Visit: Payer: Self-pay | Admitting: Urology

## 2021-05-19 ENCOUNTER — Ambulatory Visit: Payer: Medicare Other | Admitting: Urology

## 2021-05-19 DIAGNOSIS — I2510 Atherosclerotic heart disease of native coronary artery without angina pectoris: Secondary | ICD-10-CM | POA: Diagnosis not present

## 2021-05-19 DIAGNOSIS — I10 Essential (primary) hypertension: Secondary | ICD-10-CM | POA: Diagnosis not present

## 2021-05-19 DIAGNOSIS — I340 Nonrheumatic mitral (valve) insufficiency: Secondary | ICD-10-CM | POA: Diagnosis not present

## 2021-05-19 DIAGNOSIS — I4891 Unspecified atrial fibrillation: Secondary | ICD-10-CM | POA: Diagnosis not present

## 2021-05-19 DIAGNOSIS — I6389 Other cerebral infarction: Secondary | ICD-10-CM | POA: Diagnosis not present

## 2021-05-19 DIAGNOSIS — E782 Mixed hyperlipidemia: Secondary | ICD-10-CM | POA: Diagnosis not present

## 2021-05-23 DIAGNOSIS — M19012 Primary osteoarthritis, left shoulder: Secondary | ICD-10-CM | POA: Diagnosis not present

## 2021-05-23 DIAGNOSIS — Z01818 Encounter for other preprocedural examination: Secondary | ICD-10-CM | POA: Diagnosis not present

## 2021-05-25 ENCOUNTER — Encounter: Payer: Self-pay | Admitting: Internal Medicine

## 2021-05-25 ENCOUNTER — Ambulatory Visit (INDEPENDENT_AMBULATORY_CARE_PROVIDER_SITE_OTHER): Payer: Medicare Other | Admitting: Internal Medicine

## 2021-05-25 ENCOUNTER — Other Ambulatory Visit: Payer: Self-pay

## 2021-05-25 VITALS — BP 105/66 | HR 71 | Ht 67.0 in | Wt 164.0 lb

## 2021-05-25 DIAGNOSIS — G20 Parkinson's disease: Secondary | ICD-10-CM

## 2021-05-25 DIAGNOSIS — I4891 Unspecified atrial fibrillation: Secondary | ICD-10-CM | POA: Diagnosis not present

## 2021-05-25 DIAGNOSIS — G4733 Obstructive sleep apnea (adult) (pediatric): Secondary | ICD-10-CM

## 2021-05-25 DIAGNOSIS — R339 Retention of urine, unspecified: Secondary | ICD-10-CM | POA: Diagnosis not present

## 2021-05-25 DIAGNOSIS — Z23 Encounter for immunization: Secondary | ICD-10-CM

## 2021-05-25 DIAGNOSIS — Z9989 Dependence on other enabling machines and devices: Secondary | ICD-10-CM | POA: Diagnosis not present

## 2021-05-25 DIAGNOSIS — N200 Calculus of kidney: Secondary | ICD-10-CM | POA: Diagnosis not present

## 2021-05-25 LAB — POCT INR: INR: 2.1 (ref 2.0–3.0)

## 2021-05-26 ENCOUNTER — Encounter: Payer: Self-pay | Admitting: Internal Medicine

## 2021-05-26 NOTE — Assessment & Plan Note (Signed)
No symptoms recently no tenderness in the flank

## 2021-05-26 NOTE — Progress Notes (Signed)
Established Patient Office Visit  Subjective:  Patient ID: Kyle Santos, male    DOB: 09-Sep-1935  Age: 85 y.o. MRN: [id number]  CC:  Chief Complaint  Patient presents with   Atrial Fibrillation    Atrial Fibrillation Symptoms are negative for chest pain, palpitations and shortness of breath. Past medical history includes atrial fibrillation.   [name] presents for general checkup.  He is known to have a left shoulder surgery, he denies any chest pain or shortness of breath there is no paroxysmal nocturnal dyspnea or palpitation denies any swelling of the legs there is no history of syncope he does not smoke does not drink.  Pro time is therapeutic  Past Medical History:  Diagnosis Date   A-fib ([place])    Arthritis    BPH (benign prostatic hyperplasia)    Depression    Heart murmur    HOH (hard of hearing)    Hypercholesteremia    Hypertension    Loss of equilibrium    being evaluated at this time.   Pneumonia    history of   Sleep apnea     Past Surgical History:  Procedure Laterality Date   CATARACT EXTRACTION W/PHACO Right 01/09/2017   Procedure: CATARACT EXTRACTION PHACO AND INTRAOCULAR LENS PLACEMENT (IOC);  Surgeon: Birder Robson, MD;  Location: [facility] OR[facility];  Service: Ophthalmology;  Laterality: Right;  [place] 01:06 AP% 19.7 CDE 13.18 Fluid pack lot # [id number]   CATARACT EXTRACTION W/PHACO Left 08/07/2017   Procedure: CATARACT EXTRACTION PHACO AND INTRAOCULAR LENS PLACEMENT (IOC)-LEFT;  Surgeon: Birder Robson, MD;  Location: [facility] OR[facility];  Service: Ophthalmology;  Laterality: Left;  [place]  00:43 AP% 15.0 CDE 6.50 Fluid pack lot # [id number]   COLONOSCOPY     JOINT REPLACEMENT     knee   JOINT REPLACEMENT     shoulder    Family History  Problem Relation Age of Onset   Heart disease Mother    Heart disease Father     Social History   Socioeconomic History   Marital status: Married    Spouse name: [name]   Number of children: 3   Years of education:  College Degree   Highest education level: Bachelor's degree (e.g., BA, AB, BS)  Occupational History   Occupation: Retired  Tobacco Use   Smoking status: Former    Packs/day: 0.50    Years: 7.00    Pack years: 3.50    Types: Cigarettes    Quit date: 08/22/1963    Years since quitting: 57.8   Smokeless tobacco: Never  Vaping Use   Vaping Use: Never used  Substance and Sexual Activity   Alcohol use: Yes    Alcohol/week: 0.0 standard drinks    Comment: occasional   Drug use: No   Sexual activity: Not Currently    Birth control/protection: None  Other Topics Concern   Not on file  Social History Narrative   Drinks about 2 cups of caffeine.   Social Determinants of Health   Financial Resource Strain: Medium Risk   Difficulty of Paying Living Expenses: Somewhat hard  Food Insecurity: No Food Insecurity   Worried About Charity fundraiser in the Last Year: Never true   Ran Out of Food in the Last Year: Never true  Transportation Needs: No Transportation Needs   Lack of Transportation (Medical): No   Lack of Transportation (Non-Medical): No  Physical Activity: Sufficiently Active   Days of Exercise per Week: 3 days   Minutes of  Exercise per Session: 150+ min  Stress: Not on file  Social Connections: Socially Integrated   Frequency of Communication with Friends and Family: More than three times a week   Frequency of Social Gatherings with Friends and Family: Once a week   Attends Religious Services: More than 4 times per year   Active Member of [employer] or Organizations: Yes   Attends Music therapist: More than 4 times per year   Marital Status: Married  Human resources officer Violence: Not At Risk   Fear of Current or Ex-Partner: No   Emotionally Abused: No   Physically Abused: No   Sexually Abused: No     Current Outpatient Medications:    atorvastatin (LIPITOR) 80 MG tablet, Take 25 mg by mouth daily., Disp: , Rfl:    B Complex Vitamins (B COMPLEX-B12 PO), Take  1 tablet by mouth daily., Disp: , Rfl:    Casc[place] 450 MG CAPS, Take 450 mg by mouth 2 (two) times daily., Disp: , Rfl:    Cholecalciferol (VITAMIN D3) 1000 units CAPS, Take 1,000 Units by mouth daily at 3 pm., Disp: , Rfl:    losartan (COZAAR) 25 MG tablet, Take 25 mg by mouth daily., Disp: , Rfl:    Melatonin 5 MG TABS, Take 5 mg by mouth at bedtime. , Disp: , Rfl:    naproxen sodium (ALEVE) 220 MG tablet, Take 220 mg by mouth daily as needed., Disp: , Rfl:    tamsulosin (FLOMAX) 0.4 MG CAPS capsule, Take 1 capsule (0.4 mg total) by mouth daily., Disp: 90 capsule, Rfl: 3   traZODone (DESYREL) 50 MG tablet, Take 25 mg by mouth at bedtime., Disp: , Rfl:    warfarin (COUMADIN) 7.5 MG tablet, TAKE 1 TABLET BY MOUTH  DAILY, Disp: 90 tablet, Rfl: 3   No Known Allergies  ROS Review of Systems  Constitutional: Negative.   HENT: Negative.  Negative for congestion.   Eyes: Negative.   Respiratory: Negative.  Negative for cough, chest tightness, shortness of breath and wheezing.   Cardiovascular: Negative.  Negative for chest pain, palpitations and leg swelling.  Gastrointestinal: Negative.   Endocrine: Negative.   Genitourinary: Negative.  Negative for dysuria.  Musculoskeletal: Negative.   Skin: Negative.   Allergic/Immunologic: Negative.   Neurological: Negative.  Negative for headaches.  Hematological: Negative.  Does not bruise/bleed easily.  Psychiatric/Behavioral: Negative.  Negative for agitation.   All other systems reviewed and are negative.    Objective:    Physical Exam Neck:     Thyroid: No thyroid mass.     Vascular: No carotid bruit.     Trachea: Trachea normal.  Cardiovascular:     Rate and Rhythm: Rhythm irregular.     Heart sounds: No murmur heard. Musculoskeletal:       Arms:     Cervical back: Neck supple.     Right lower leg: No edema.     Comments: There is limited mobility of the left shoulder.  Heart is regular chest is clear abdomen is soft  nontender without any hepatosplenomegaly  Lymphadenopathy:     Cervical: No cervical adenopathy.    BP 105/66   Pulse 71   Ht 5\' 7"  (1.702 m)   Wt 164 lb (74.4 kg)   BMI 25.69 kg/m  Wt Readings from Last 3 Encounters:  05/25/21 164 lb (74.4 kg)  04/26/21 164 lb 9.6 oz (74.7 kg)  03/21/21 167 lb 11.2 oz (76.1 kg)     Health Maintenance  Due  Topic Date Due   Zoster Vaccines- Shingrix (1 of 2) Never done    There are no preventive care reminders to display for this patient.  Lab Results  Component Value Date   TSH 1.77 08/25/2020   Lab Results  Component Value Date   WBC 9.7 08/25/2020   HGB 14.0 08/25/2020   HCT 41.1 08/25/2020   MCV 91.5 08/25/2020   PLT 175 08/25/2020   Lab Results  Component Value Date   NA 142 08/25/2020   K 4.6 08/25/2020   CO2 26 08/25/2020   GLUCOSE 98 08/25/2020   BUN 18 08/25/2020   CREATININE 1.21 (H) 08/25/2020   BILITOT 0.9 08/25/2020   AST 25 08/25/2020   ALT 25 08/25/2020   PROT 7.2 08/25/2020   CALCIUM 9.9 08/25/2020   ANIONGAP 9 07/02/2019   No results found for: CHOL No results found for: HDL No results found for: LDLCALC No results found for: TRIG No results found for: CHOLHDL Lab Results  Component Value Date   HGBA1C 6.5 (H) 08/25/2020   Patient is approved for left shoulder surgery.  He was advised to stop taking his Coumadin 5 days before surgery   Assessment & Plan:   Problem List Items Addressed This Visit       Cardiovascular and Mediastinum   Atrial fibrillation ([place]) - Primary    Stable at the present time heart is regular chest is clear abdomen is soft nontender.      Relevant Orders   POCT INR (Completed)   Flu vaccine HIGH DOSE PF (Fluzone High dose) (Completed)     Respiratory   OSA on CPAP    Stable        Nervous and Auditory   Primary parkinsonism ([place])    Stable Memory is good        Genitourinary   Kidney stone    No symptoms recently no tenderness in the flank         Other   Incomplete emptying of bladder (Chronic)    Patient follows up with urologist for that       No orders of the defined types were placed in this encounter.   Follow-up: No follow-ups on file.    Cletis Athens, MD

## 2021-05-26 NOTE — Assessment & Plan Note (Signed)
Stable

## 2021-05-26 NOTE — Assessment & Plan Note (Signed)
Stable at the present time heart is regular chest is clear abdomen is soft nontender.

## 2021-05-26 NOTE — Assessment & Plan Note (Signed)
Stable Memory is good

## 2021-05-26 NOTE — Assessment & Plan Note (Signed)
Patient follows up with urologist for that

## 2021-05-31 DIAGNOSIS — M19012 Primary osteoarthritis, left shoulder: Secondary | ICD-10-CM | POA: Diagnosis not present

## 2021-05-31 DIAGNOSIS — Z96612 Presence of left artificial shoulder joint: Secondary | ICD-10-CM | POA: Diagnosis not present

## 2021-05-31 DIAGNOSIS — I129 Hypertensive chronic kidney disease with stage 1 through stage 4 chronic kidney disease, or unspecified chronic kidney disease: Secondary | ICD-10-CM | POA: Diagnosis not present

## 2021-05-31 DIAGNOSIS — Z471 Aftercare following joint replacement surgery: Secondary | ICD-10-CM | POA: Diagnosis not present

## 2021-05-31 DIAGNOSIS — G4733 Obstructive sleep apnea (adult) (pediatric): Secondary | ICD-10-CM | POA: Diagnosis not present

## 2021-05-31 DIAGNOSIS — G4730 Sleep apnea, unspecified: Secondary | ICD-10-CM | POA: Diagnosis not present

## 2021-05-31 DIAGNOSIS — Z87891 Personal history of nicotine dependence: Secondary | ICD-10-CM | POA: Diagnosis not present

## 2021-05-31 DIAGNOSIS — N189 Chronic kidney disease, unspecified: Secondary | ICD-10-CM | POA: Diagnosis not present

## 2021-06-01 DIAGNOSIS — I129 Hypertensive chronic kidney disease with stage 1 through stage 4 chronic kidney disease, or unspecified chronic kidney disease: Secondary | ICD-10-CM | POA: Diagnosis not present

## 2021-06-01 DIAGNOSIS — G4730 Sleep apnea, unspecified: Secondary | ICD-10-CM | POA: Diagnosis not present

## 2021-06-01 DIAGNOSIS — Z87891 Personal history of nicotine dependence: Secondary | ICD-10-CM | POA: Diagnosis not present

## 2021-06-01 DIAGNOSIS — M19012 Primary osteoarthritis, left shoulder: Secondary | ICD-10-CM | POA: Diagnosis not present

## 2021-06-01 DIAGNOSIS — N189 Chronic kidney disease, unspecified: Secondary | ICD-10-CM | POA: Diagnosis not present

## 2021-06-10 DIAGNOSIS — M19012 Primary osteoarthritis, left shoulder: Secondary | ICD-10-CM | POA: Diagnosis not present

## 2021-06-16 DIAGNOSIS — I340 Nonrheumatic mitral (valve) insufficiency: Secondary | ICD-10-CM | POA: Diagnosis not present

## 2021-06-16 DIAGNOSIS — I4891 Unspecified atrial fibrillation: Secondary | ICD-10-CM | POA: Diagnosis not present

## 2021-06-16 DIAGNOSIS — I6389 Other cerebral infarction: Secondary | ICD-10-CM | POA: Diagnosis not present

## 2021-06-16 DIAGNOSIS — I2510 Atherosclerotic heart disease of native coronary artery without angina pectoris: Secondary | ICD-10-CM | POA: Diagnosis not present

## 2021-06-16 DIAGNOSIS — I10 Essential (primary) hypertension: Secondary | ICD-10-CM | POA: Diagnosis not present

## 2021-06-28 ENCOUNTER — Other Ambulatory Visit: Payer: Self-pay

## 2021-06-28 ENCOUNTER — Ambulatory Visit (INDEPENDENT_AMBULATORY_CARE_PROVIDER_SITE_OTHER): Payer: Medicare Other | Admitting: Internal Medicine

## 2021-06-28 ENCOUNTER — Encounter: Payer: Self-pay | Admitting: Internal Medicine

## 2021-06-28 VITALS — BP 140/80 | HR 65 | Ht 67.0 in | Wt 161.2 lb

## 2021-06-28 DIAGNOSIS — R3914 Feeling of incomplete bladder emptying: Secondary | ICD-10-CM | POA: Diagnosis not present

## 2021-06-28 DIAGNOSIS — I4891 Unspecified atrial fibrillation: Secondary | ICD-10-CM | POA: Diagnosis not present

## 2021-06-28 DIAGNOSIS — Z9989 Dependence on other enabling machines and devices: Secondary | ICD-10-CM | POA: Diagnosis not present

## 2021-06-28 DIAGNOSIS — G4733 Obstructive sleep apnea (adult) (pediatric): Secondary | ICD-10-CM | POA: Diagnosis not present

## 2021-06-28 DIAGNOSIS — N401 Enlarged prostate with lower urinary tract symptoms: Secondary | ICD-10-CM

## 2021-06-28 MED ORDER — WARFARIN SODIUM 7.5 MG PO TABS
7.5000 mg | ORAL_TABLET | Freq: Every day | ORAL | 3 refills | Status: DC
Start: 2021-06-28 — End: 2022-05-29

## 2021-06-28 MED ORDER — TAMSULOSIN HCL 0.4 MG PO CAPS: 0.4000 mg | ORAL_CAPSULE | Freq: Every day | ORAL | 3 refills | Status: DC

## 2021-06-28 NOTE — Progress Notes (Signed)
Established Patient Office Visit  Subjective:  Patient ID: Kyle Santos, male    DOB: Mar 19, 1936  Age: 85 y.o. MRN: [id number]  CC:  Chief Complaint  Patient presents with   Atrial Fibrillation    Atrial Fibrillation Past medical history includes atrial fibrillation.   Kyle Santos presents for patient had a left shoulder surgery   Left shoulder replacement.  Past Medical History:  Diagnosis Date   A-fib [facility])    Arthritis    BPH (benign prostatic hyperplasia)    Depression    Heart murmur    HOH (hard of hearing)    Hypercholesteremia    Hypertension    Loss of equilibrium    being evaluated at this time.   Pneumonia    history of   Sleep apnea     Past Surgical History:  Procedure Laterality Date   CATARACT EXTRACTION W/PHACO Right 01/09/2017   Procedure: CATARACT EXTRACTION PHACO AND INTRAOCULAR LENS PLACEMENT (IOC);  Surgeon: Birder Robson, MD;  Location: [facility] ORS;  Service: Ophthalmology;  Laterality: Right;  [place] 01:06 AP% 19.7 CDE 13.18 Fluid pack lot # [id number]   CATARACT EXTRACTION W/PHACO Left 08/07/2017   Procedure: CATARACT EXTRACTION PHACO AND INTRAOCULAR LENS PLACEMENT (IOC)-LEFT;  Surgeon: Birder Robson, MD;  Location: [facility] ORS;  Service: Ophthalmology;  Laterality: Left;  [place]  00:43 AP% 15.0 CDE 6.50 Fluid pack lot # [id number]   COLONOSCOPY     JOINT REPLACEMENT     knee   JOINT REPLACEMENT     shoulder    Family History  Problem Relation Age of Onset   Heart disease Mother    Heart disease Father     Social History   Socioeconomic History   Marital status: Married    Spouse name: [name]   Number of children: 3   Years of education: College Degree   Highest education level: Bachelor's degree (e.g., BA, AB, BS)  Occupational History   Occupation: Retired  Tobacco Use   Smoking status: Former    Packs/day: 0.50    Years: 7.00    Pack years: 3.50    Types: Cigarettes    Quit date: 08/22/1963    Years since quitting: 57.8    Smokeless tobacco: Never  Vaping Use   Vaping Use: Never used  Substance and Sexual Activity   Alcohol use: Yes    Alcohol/week: 0.0 standard drinks    Comment: occasional   Drug use: No   Sexual activity: Not Currently    Birth control/protection: None  Other Topics Concern   Not on file  Social History Narrative   Drinks about 2 cups of caffeine.   Social Determinants of Health   Financial Resource Strain: Medium Risk   Difficulty of Paying Living Expenses: Somewhat hard  Food Insecurity: No Food Insecurity   Worried About Charity fundraiser in the Last Year: Never true   Ran Out of Food in the Last Year: Never true  Transportation Needs: No Transportation Needs   Lack of Transportation (Medical): No   Lack of Transportation (Non-Medical): No  Physical Activity: Sufficiently Active   Days of Exercise per Week: 3 days   Minutes of Exercise per Session: 150+ min  Stress: Not on file  Social Connections: Socially Integrated   Frequency of Communication with Friends and Family: More than three times a week   Frequency of Social Gatherings with Friends and Family: Once a week   Attends Religious Services: More than 4 times per  year   Active Member of Clubs or Organizations: Yes   Attends Music therapist: More than 4 times per year   Marital Status: Married  Human resources officer Violence: Not At Risk   Fear of Current or Ex-Partner: No   Emotionally Abused: No   Physically Abused: No   Sexually Abused: No     Current Outpatient Medications:    atorvastatin (LIPITOR) 80 MG tablet, Take 25 mg by mouth daily., Disp: , Rfl:    B Complex Vitamins (B COMPLEX-B12 PO), Take 1 tablet by mouth daily., Disp: , Rfl:    Casc[place] 450 MG CAPS, Take 450 mg by mouth 2 (two) times daily., Disp: , Rfl:    Cholecalciferol (VITAMIN D3) 1000 units CAPS, Take 1,000 Units by mouth daily at 3 pm., Disp: , Rfl:    losartan (COZAAR) 25 MG tablet, Take 25 mg by mouth daily., Disp:  , Rfl:    Melatonin 5 MG TABS, Take 5 mg by mouth at bedtime. , Disp: , Rfl:    naproxen sodium (ALEVE) 220 MG tablet, Take 220 mg by mouth daily as needed., Disp: , Rfl:    traZODone (DESYREL) 50 MG tablet, Take 25 mg by mouth at bedtime., Disp: , Rfl:    tamsulosin (FLOMAX) 0.4 MG CAPS capsule, Take 1 capsule (0.4 mg total) by mouth daily., Disp: 90 capsule, Rfl: 3   warfarin (COUMADIN) 7.5 MG tablet, Take 1 tablet (7.5 mg total) by mouth daily., Disp: 90 tablet, Rfl: 3   No Known Allergies  ROS Review of Systems  Constitutional: Negative.   HENT: Negative.    Eyes: Negative.   Respiratory: Negative.    Cardiovascular: Negative.   Gastrointestinal: Negative.   Endocrine: Negative.   Genitourinary: Negative.   Musculoskeletal: Negative.   Skin: Negative.   Allergic/Immunologic: Negative.   Neurological: Negative.   Hematological: Negative.   Psychiatric/Behavioral: Negative.    All other systems reviewed and are negative.    Objective:    Physical Exam Vitals reviewed.  Constitutional:      Appearance: Normal appearance.  HENT:     Mouth/Throat:     Mouth: Mucous membranes are moist.  Eyes:     Pupils: Pupils are equal, round, and reactive to light.  Neck:     Vascular: No carotid bruit.  Cardiovascular:     Rate and Rhythm: Normal rate and regular rhythm.     Pulses: Normal pulses.     Heart sounds: Normal heart sounds.  Pulmonary:     Effort: Pulmonary effort is normal.     Breath sounds: Normal breath sounds.  Abdominal:     General: Bowel sounds are normal.     Palpations: Abdomen is soft. There is no hepatomegaly, splenomegaly or mass.     Tenderness: There is no abdominal tenderness.     Hernia: No hernia is present.  Musculoskeletal:     Cervical back: Neck supple.     Right lower leg: No edema.     Left lower leg: No edema.  Skin:    Findings: No rash.  Neurological:     Mental Status: He is alert and oriented to person, place, and time.      Motor: No weakness.  Psychiatric:        Mood and Affect: Mood normal.        Behavior: Behavior normal.    BP 140/80   Pulse 65   Ht 5\' 7"  (1.702 m)   Wt 161 lb  3.2 oz (73.1 kg)   BMI 25.25 kg/m  Wt Readings from Last 3 Encounters:  06/28/21 161 lb 3.2 oz (73.1 kg)  05/25/21 164 lb (74.4 kg)  04/26/21 164 lb 9.6 oz (74.7 kg)     Health Maintenance Due  Topic Date Due   Zoster Vaccines- Shingrix (1 of 2) Never done   COVID-19 Vaccine (5 - Booster for Moderna series) 02/24/2021    There are no preventive care reminders to display for this patient.  Lab Results  Component Value Date   TSH 1.77 08/25/2020   Lab Results  Component Value Date   WBC 9.7 08/25/2020   HGB 14.0 08/25/2020   HCT 41.1 08/25/2020   MCV 91.5 08/25/2020   PLT 175 08/25/2020   Lab Results  Component Value Date   NA 142 08/25/2020   K 4.6 08/25/2020   CO2 26 08/25/2020   GLUCOSE 98 08/25/2020   BUN 18 08/25/2020   CREATININE 1.21 (H) 08/25/2020   BILITOT 0.9 08/25/2020   AST 25 08/25/2020   ALT 25 08/25/2020   PROT 7.2 08/25/2020   CALCIUM 9.9 08/25/2020   ANIONGAP 9 07/02/2019   No results found for: CHOL No results found for: HDL No results found for: LDLCALC No results found for: TRIG No results found for: CHOLHDL Lab Results  Component Value Date   HGBA1C 6.5 (H) 08/25/2020      Assessment & Plan:   Problem List Items Addressed This Visit       Cardiovascular and Mediastinum   Atrial fibrillation ([place]) - Primary    Stable at the present time, Pro time Is therapeutic      Relevant Medications   warfarin (COUMADIN) 7.5 MG tablet   Other Relevant Orders   POCT INR     Respiratory   OSA on CPAP    Patient uses a CPAP machine        Genitourinary   Benign localized prostatic hyperplasia with lower urinary tract symptoms (LUTS)    Under control      Relevant Medications   tamsulosin (FLOMAX) 0.4 MG CAPS capsule    Meds ordered this encounter  Medications    tamsulosin (FLOMAX) 0.4 MG CAPS capsule    Sig: Take 1 capsule (0.4 mg total) by mouth daily.    Dispense:  90 capsule    Refill:  3    Requesting 1 year supply   warfarin (COUMADIN) 7.5 MG tablet    Sig: Take 1 tablet (7.5 mg total) by mouth daily.    Dispense:  90 tablet    Refill:  3    Requesting 1 year supply  Patient is doing well postoperatively, he has a left shoulder replacement  Follow-up: No follow-ups on file.    Cletis Athens, MD

## 2021-06-28 NOTE — Assessment & Plan Note (Signed)
Stable at the present time, Pro time Is therapeutic

## 2021-06-28 NOTE — Assessment & Plan Note (Signed)
Under control 

## 2021-06-28 NOTE — Assessment & Plan Note (Signed)
Patient uses a CPAP machine

## 2021-07-01 ENCOUNTER — Encounter: Payer: Self-pay | Admitting: Internal Medicine

## 2021-07-01 DIAGNOSIS — M19012 Primary osteoarthritis, left shoulder: Secondary | ICD-10-CM | POA: Diagnosis not present

## 2021-07-11 DIAGNOSIS — M25612 Stiffness of left shoulder, not elsewhere classified: Secondary | ICD-10-CM | POA: Diagnosis not present

## 2021-07-11 DIAGNOSIS — M6281 Muscle weakness (generalized): Secondary | ICD-10-CM | POA: Diagnosis not present

## 2021-07-11 DIAGNOSIS — M25512 Pain in left shoulder: Secondary | ICD-10-CM | POA: Diagnosis not present

## 2021-07-15 DIAGNOSIS — M6281 Muscle weakness (generalized): Secondary | ICD-10-CM | POA: Diagnosis not present

## 2021-07-15 DIAGNOSIS — M25612 Stiffness of left shoulder, not elsewhere classified: Secondary | ICD-10-CM | POA: Diagnosis not present

## 2021-07-15 DIAGNOSIS — M25512 Pain in left shoulder: Secondary | ICD-10-CM | POA: Diagnosis not present

## 2021-07-19 DIAGNOSIS — M25612 Stiffness of left shoulder, not elsewhere classified: Secondary | ICD-10-CM | POA: Diagnosis not present

## 2021-07-19 DIAGNOSIS — M6281 Muscle weakness (generalized): Secondary | ICD-10-CM | POA: Diagnosis not present

## 2021-07-19 DIAGNOSIS — M25512 Pain in left shoulder: Secondary | ICD-10-CM | POA: Diagnosis not present

## 2021-07-22 DIAGNOSIS — M25512 Pain in left shoulder: Secondary | ICD-10-CM | POA: Diagnosis not present

## 2021-07-22 DIAGNOSIS — M25612 Stiffness of left shoulder, not elsewhere classified: Secondary | ICD-10-CM | POA: Diagnosis not present

## 2021-07-22 DIAGNOSIS — M6281 Muscle weakness (generalized): Secondary | ICD-10-CM | POA: Diagnosis not present

## 2021-07-26 DIAGNOSIS — M25612 Stiffness of left shoulder, not elsewhere classified: Secondary | ICD-10-CM | POA: Diagnosis not present

## 2021-07-26 DIAGNOSIS — M6281 Muscle weakness (generalized): Secondary | ICD-10-CM | POA: Diagnosis not present

## 2021-07-26 DIAGNOSIS — M25512 Pain in left shoulder: Secondary | ICD-10-CM | POA: Diagnosis not present

## 2021-07-28 ENCOUNTER — Ambulatory Visit (INDEPENDENT_AMBULATORY_CARE_PROVIDER_SITE_OTHER): Payer: Medicare Other | Admitting: Internal Medicine

## 2021-07-28 ENCOUNTER — Encounter: Payer: Self-pay | Admitting: Internal Medicine

## 2021-07-28 ENCOUNTER — Other Ambulatory Visit: Payer: Self-pay

## 2021-07-28 VITALS — BP 120/68 | HR 65 | Ht 67.0 in | Wt 161.2 lb

## 2021-07-28 DIAGNOSIS — G250 Essential tremor: Secondary | ICD-10-CM | POA: Diagnosis not present

## 2021-07-28 DIAGNOSIS — I4891 Unspecified atrial fibrillation: Secondary | ICD-10-CM | POA: Diagnosis not present

## 2021-07-28 DIAGNOSIS — M519 Unspecified thoracic, thoracolumbar and lumbosacral intervertebral disc disorder: Secondary | ICD-10-CM

## 2021-07-28 DIAGNOSIS — N200 Calculus of kidney: Secondary | ICD-10-CM | POA: Diagnosis not present

## 2021-07-28 LAB — POCT INR: INR: 2 (ref 2.0–3.0)

## 2021-07-29 DIAGNOSIS — M25612 Stiffness of left shoulder, not elsewhere classified: Secondary | ICD-10-CM | POA: Diagnosis not present

## 2021-07-29 DIAGNOSIS — M6281 Muscle weakness (generalized): Secondary | ICD-10-CM | POA: Diagnosis not present

## 2021-07-29 DIAGNOSIS — M25512 Pain in left shoulder: Secondary | ICD-10-CM | POA: Diagnosis not present

## 2021-07-31 ENCOUNTER — Encounter: Payer: Self-pay | Admitting: Internal Medicine

## 2021-07-31 NOTE — Assessment & Plan Note (Signed)
-   Patient's back pain is under control with medication.  - Encouraged the patient to stretch or do yoga as able to help with back pain 

## 2021-07-31 NOTE — Assessment & Plan Note (Signed)
Patient has early parkinsonian disease

## 2021-07-31 NOTE — Assessment & Plan Note (Signed)
Atrial fibrillation is under control

## 2021-07-31 NOTE — Assessment & Plan Note (Signed)
No recurrence. 

## 2021-07-31 NOTE — Progress Notes (Signed)
Established Patient Office Visit  Subjective:  Patient ID: Kyle Santos, male    DOB: [birth year]  Age: 85 y.o. MRN: [id number]  CC:  Chief Complaint  Patient presents with   Atrial Fibrillation    Patient is here for INR check    Atrial Fibrillation Past medical history includes atrial fibrillation.   Kyle Santos presents for checkup he denies any chest pain or shortness of breath.  His gait is unsteady  Past Medical History:  Diagnosis Date   A-fib ([place])    Arthritis    BPH (benign prostatic hyperplasia)    Depression    Heart murmur    HOH (hard of hearing)    Hypercholesteremia    Hypertension    Loss of equilibrium    being evaluated at this time.   Pneumonia    history of   Sleep apnea     Past Surgical History:  Procedure Laterality Date   CATARACT EXTRACTION W/PHACO Right 01/09/2017   Procedure: CATARACT EXTRACTION PHACO AND INTRAOCULAR LENS PLACEMENT (IOC);  Surgeon: Birder Robson, MD;  Location: [facility] ORS;  Service: Ophthalmology;  Laterality: Right;  [place] 01:06 AP% 19.7 CDE 13.18 Fluid pack lot # [id number]   CATARACT EXTRACTION W/PHACO Left 08/07/2017   Procedure: CATARACT EXTRACTION PHACO AND INTRAOCULAR LENS PLACEMENT (IOC)-LEFT;  Surgeon: Birder Robson, MD;  Location: [facility] ORS;  Service: Ophthalmology;  Laterality: Left;  [place]  00:43 AP% 15.0 CDE 6.50 Fluid pack lot # [id number]   COLONOSCOPY     JOINT REPLACEMENT     knee   JOINT REPLACEMENT     shoulder    Family History  Problem Relation Age of Onset   Heart disease Mother    Heart disease Father     Social History   Socioeconomic History   Marital status: Married    Spouse name: [name]   Number of children: 3   Years of education: College Degree   Highest education level: Bachelor's degree (e.g., BA, AB, BS)  Occupational History   Occupation: Retired  Tobacco Use   Smoking status: Former    Packs/day: 0.50    Years: 7.00    Pack years: 3.50    Types: Cigarettes    Quit  date: 08/22/1963    Years since quitting: 57.9   Smokeless tobacco: Never  Vaping Use   Vaping Use: Never used  Substance and Sexual Activity   Alcohol use: Yes    Alcohol/week: 0.0 standard drinks    Comment: occasional   Drug use: No   Sexual activity: Not Currently    Birth control/protection: None  Other Topics Concern   Not on file  Social History Narrative   Drinks about 2 cups of caffeine.   Social Determinants of Health   Financial Resource Strain: Medium Risk   Difficulty of Paying Living Expenses: Somewhat hard  Food Insecurity: No Food Insecurity   Worried About Charity fundraiser in the Last Year: Never true   Ran Out of Food in the Last Year: Never true  Transportation Needs: No Transportation Needs   Lack of Transportation (Medical): No   Lack of Transportation (Non-Medical): No  Physical Activity: Sufficiently Active   Days of Exercise per Week: 3 days   Minutes of Exercise per Session: 150+ min  Stress: Not on file  Social Connections: Socially Integrated   Frequency of Communication with Friends and Family: More than three times a week   Frequency of Social Gatherings with Friends and Family:  Once a week   Attends Religious Services: More than 4 times per year   Active Member of Clubs or Organizations: Yes   Attends Music therapist: More than 4 times per year   Marital Status: Married  Human resources officer Violence: Not At Risk   Fear of Current or Ex-Partner: No   Emotionally Abused: No   Physically Abused: No   Sexually Abused: No     Current Outpatient Medications:    atorvastatin (LIPITOR) 80 MG tablet, Take 25 mg by mouth daily., Disp: , Rfl:    B Complex Vitamins (B COMPLEX-B12 PO), Take 1 tablet by mouth daily., Disp: , Rfl:    C[place] 450 MG CAPS, Take 450 mg by mouth 2 (two) times daily., Disp: , Rfl:    Cholecalciferol (VITAMIN D3) 1000 units CAPS, Take 1,000 Units by mouth daily at 3 pm., Disp: , Rfl:    losartan (COZAAR)  25 MG tablet, Take 25 mg by mouth daily., Disp: , Rfl:    Melatonin 5 MG TABS, Take 5 mg by mouth at bedtime. , Disp: , Rfl:    naproxen sodium (ALEVE) 220 MG tablet, Take 220 mg by mouth daily as needed., Disp: , Rfl:    tamsulosin (FLOMAX) 0.4 MG CAPS capsule, Take 1 capsule (0.4 mg total) by mouth daily., Disp: 90 capsule, Rfl: 3   traZODone (DESYREL) 50 MG tablet, Take 25 mg by mouth at bedtime., Disp: , Rfl:    warfarin (COUMADIN) 7.5 MG tablet, Take 1 tablet (7.5 mg total) by mouth daily., Disp: 90 tablet, Rfl: 3   No Known Allergies  ROS Review of Systems  Constitutional: Negative.   HENT: Negative.    Eyes: Negative.   Respiratory: Negative.    Cardiovascular: Negative.   Gastrointestinal: Negative.   Endocrine: Negative.   Genitourinary: Negative.   Musculoskeletal: Negative.   Skin: Negative.   Allergic/Immunologic: Negative.   Neurological: Negative.   Hematological: Negative.   Psychiatric/Behavioral: Negative.    All other systems reviewed and are negative.    Objective:    Physical Exam Vitals reviewed.  Constitutional:      Appearance: Normal appearance.  HENT:     Mouth/Throat:     Mouth: Mucous membranes are moist.  Eyes:     Pupils: Pupils are equal, round, and reactive to light.  Neck:     Vascular: No carotid bruit.  Cardiovascular:     Rate and Rhythm: Normal rate and regular rhythm.     Pulses: Normal pulses.     Heart sounds: Normal heart sounds.  Pulmonary:     Effort: Pulmonary effort is normal.     Breath sounds: Normal breath sounds.  Abdominal:     General: Bowel sounds are normal.     Palpations: Abdomen is soft. There is no hepatomegaly, splenomegaly or mass.     Tenderness: There is no abdominal tenderness.     Hernia: No hernia is present.  Musculoskeletal:     Cervical back: Neck supple.     Right lower leg: No edema.     Left lower leg: No edema.  Skin:    Findings: No rash.  Neurological:     Mental Status: He is alert and  oriented to person, place, and time.     Motor: No weakness.  Psychiatric:        Mood and Affect: Mood normal.        Behavior: Behavior normal.    BP 120/68   Pulse  65   Ht 5\' 7"  (1.702 m)   Wt 161 lb 3.2 oz (73.1 kg)   BMI 25.25 kg/m  Wt Readings from Last 3 Encounters:  07/28/21 161 lb 3.2 oz (73.1 kg)  06/28/21 161 lb 3.2 oz (73.1 kg)  05/25/21 164 lb (74.4 kg)     Health Maintenance Due  Topic Date Due   Zoster Vaccines- Shingrix (1 of 2) Never done   COVID-19 Vaccine (5 - Booster for Moderna series) 02/24/2021    There are no preventive care reminders to display for this patient.  Lab Results  Component Value Date   TSH 1.77 08/25/2020   Lab Results  Component Value Date   WBC 9.7 08/25/2020   HGB 14.0 08/25/2020   HCT 41.1 08/25/2020   MCV 91.5 08/25/2020   PLT 175 08/25/2020   Lab Results  Component Value Date   NA 142 08/25/2020   K 4.6 08/25/2020   CO2 26 08/25/2020   GLUCOSE 98 08/25/2020   BUN 18 08/25/2020   CREATININE 1.21 (H) 08/25/2020   BILITOT 0.9 08/25/2020   AST 25 08/25/2020   ALT 25 08/25/2020   PROT 7.2 08/25/2020   CALCIUM 9.9 08/25/2020   ANIONGAP 9 07/02/2019   No results found for: CHOL No results found for: HDL No results found for: LDLCALC No results found for: TRIG No results found for: CHOLHDL Lab Results  Component Value Date   HGBA1C 6.5 (H) 08/25/2020      Assessment & Plan:   Problem List Items Addressed This Visit       Cardiovascular and Mediastinum   Atrial fibrillation ([place]) - Primary    Atrial fibrillation is under control      Relevant Orders   POCT INR (Completed)     Nervous and Auditory   Tremor, essential    Patient has early parkinsonian disease        Musculoskeletal and Integument   Lumbosacral disc disease    - Patient's back pain is under control with medication.  - Encouraged the patient to stretch or do yoga as able to help with back pain        Genitourinary   Kidney  stone    No recurrence     Pro time is therapeutic  No orders of the defined types were placed in this encounter.   Follow-up: No follow-ups on file.    Cletis Athens, MD

## 2021-08-02 DIAGNOSIS — M25512 Pain in left shoulder: Secondary | ICD-10-CM | POA: Diagnosis not present

## 2021-08-02 DIAGNOSIS — M6281 Muscle weakness (generalized): Secondary | ICD-10-CM | POA: Diagnosis not present

## 2021-08-02 DIAGNOSIS — M25612 Stiffness of left shoulder, not elsewhere classified: Secondary | ICD-10-CM | POA: Diagnosis not present

## 2021-08-05 DIAGNOSIS — M25612 Stiffness of left shoulder, not elsewhere classified: Secondary | ICD-10-CM | POA: Diagnosis not present

## 2021-08-05 DIAGNOSIS — M25512 Pain in left shoulder: Secondary | ICD-10-CM | POA: Diagnosis not present

## 2021-08-05 DIAGNOSIS — M6281 Muscle weakness (generalized): Secondary | ICD-10-CM | POA: Diagnosis not present

## 2021-08-08 DIAGNOSIS — M25612 Stiffness of left shoulder, not elsewhere classified: Secondary | ICD-10-CM | POA: Diagnosis not present

## 2021-08-08 DIAGNOSIS — M6281 Muscle weakness (generalized): Secondary | ICD-10-CM | POA: Diagnosis not present

## 2021-08-08 DIAGNOSIS — M25512 Pain in left shoulder: Secondary | ICD-10-CM | POA: Diagnosis not present

## 2021-08-12 DIAGNOSIS — M6281 Muscle weakness (generalized): Secondary | ICD-10-CM | POA: Diagnosis not present

## 2021-08-12 DIAGNOSIS — M25512 Pain in left shoulder: Secondary | ICD-10-CM | POA: Diagnosis not present

## 2021-08-12 DIAGNOSIS — M25612 Stiffness of left shoulder, not elsewhere classified: Secondary | ICD-10-CM | POA: Diagnosis not present

## 2021-08-16 DIAGNOSIS — R0602 Shortness of breath: Secondary | ICD-10-CM | POA: Diagnosis not present

## 2021-08-16 DIAGNOSIS — I4891 Unspecified atrial fibrillation: Secondary | ICD-10-CM | POA: Diagnosis not present

## 2021-08-16 DIAGNOSIS — I10 Essential (primary) hypertension: Secondary | ICD-10-CM | POA: Diagnosis not present

## 2021-08-16 DIAGNOSIS — I340 Nonrheumatic mitral (valve) insufficiency: Secondary | ICD-10-CM | POA: Diagnosis not present

## 2021-08-16 DIAGNOSIS — E782 Mixed hyperlipidemia: Secondary | ICD-10-CM | POA: Diagnosis not present

## 2021-08-16 DIAGNOSIS — I2510 Atherosclerotic heart disease of native coronary artery without angina pectoris: Secondary | ICD-10-CM | POA: Diagnosis not present

## 2021-08-17 DIAGNOSIS — M6281 Muscle weakness (generalized): Secondary | ICD-10-CM | POA: Diagnosis not present

## 2021-08-17 DIAGNOSIS — M25512 Pain in left shoulder: Secondary | ICD-10-CM | POA: Diagnosis not present

## 2021-08-17 DIAGNOSIS — M25612 Stiffness of left shoulder, not elsewhere classified: Secondary | ICD-10-CM | POA: Diagnosis not present

## 2021-08-24 DIAGNOSIS — M25612 Stiffness of left shoulder, not elsewhere classified: Secondary | ICD-10-CM | POA: Diagnosis not present

## 2021-08-24 DIAGNOSIS — M6281 Muscle weakness (generalized): Secondary | ICD-10-CM | POA: Diagnosis not present

## 2021-08-24 DIAGNOSIS — M25512 Pain in left shoulder: Secondary | ICD-10-CM | POA: Diagnosis not present

## 2021-08-29 ENCOUNTER — Other Ambulatory Visit: Payer: Self-pay

## 2021-08-29 ENCOUNTER — Encounter: Payer: Self-pay | Admitting: Internal Medicine

## 2021-08-29 ENCOUNTER — Ambulatory Visit (INDEPENDENT_AMBULATORY_CARE_PROVIDER_SITE_OTHER): Payer: Medicare Other | Admitting: Internal Medicine

## 2021-08-29 VITALS — BP 130/80 | HR 64 | Ht 67.0 in | Wt 165.1 lb

## 2021-08-29 DIAGNOSIS — G250 Essential tremor: Secondary | ICD-10-CM | POA: Diagnosis not present

## 2021-08-29 DIAGNOSIS — I10 Essential (primary) hypertension: Secondary | ICD-10-CM | POA: Diagnosis not present

## 2021-08-29 DIAGNOSIS — I4891 Unspecified atrial fibrillation: Secondary | ICD-10-CM

## 2021-08-29 DIAGNOSIS — M519 Unspecified thoracic, thoracolumbar and lumbosacral intervertebral disc disorder: Secondary | ICD-10-CM

## 2021-08-29 DIAGNOSIS — N401 Enlarged prostate with lower urinary tract symptoms: Secondary | ICD-10-CM

## 2021-08-29 LAB — POCT INR: INR: 2.5 (ref 2.0–3.0)

## 2021-08-29 NOTE — Assessment & Plan Note (Signed)
stable °

## 2021-08-29 NOTE — Progress Notes (Signed)
Established Patient Office Visit  Subjective:  Patient ID: Kyle Santos, male    DOB: 1936-03-28  Age: 86 y.o. MRN: [id number]  CC:  Chief Complaint  Patient presents with   Atrial Fibrillation    Atrial Fibrillation Past medical history includes atrial fibrillation.   Kyle Santos Patient presents for check up  Past Medical History:  Diagnosis Date   A-fib [facility])    Arthritis    BPH (benign prostatic hyperplasia)    Depression    Heart murmur    HOH (hard of hearing)    Hypercholesteremia    Hypertension    Loss of equilibrium    being evaluated at this time.   Pneumonia    history of   Sleep apnea     Past Surgical History:  Procedure Laterality Date   CATARACT EXTRACTION W/PHACO Right 01/09/2017   Procedure: CATARACT EXTRACTION PHACO AND INTRAOCULAR LENS PLACEMENT (IOC);  Surgeon: Birder Robson, MD;  Location: [facility] ORS;  Service: Ophthalmology;  Laterality: Right;  [place] 01:06 AP% 19.7 CDE 13.18 Fluid pack lot # [id number]   CATARACT EXTRACTION W/PHACO Left 08/07/2017   Procedure: CATARACT EXTRACTION PHACO AND INTRAOCULAR LENS PLACEMENT (IOC)-LEFT;  Surgeon: Birder Robson, MD;  Location: [facility] ORS;  Service: Ophthalmology;  Laterality: Left;  [place]  00:43 AP% 15.0 CDE 6.50 Fluid pack lot # [id number]   COLONOSCOPY     JOINT REPLACEMENT     knee   JOINT REPLACEMENT     shoulder    Family History  Problem Relation Age of Onset   Heart disease Mother    Heart disease Father     Social History   Socioeconomic History   Marital status: Married    Spouse name: [name]   Number of children: 3   Years of education: College Degree   Highest education level: Bachelor's degree (e.g., BA, AB, BS)  Occupational History   Occupation: Retired  Tobacco Use   Smoking status: Former    Packs/day: 0.50    Years: 7.00    Pack years: 3.50    Types: Cigarettes    Quit date: 08/22/1963    Years since quitting: 58.0   Smokeless tobacco: Never  Vaping Use   Vaping Use:  Never used  Substance and Sexual Activity   Alcohol use: Yes    Alcohol/week: 0.0 standard drinks    Comment: occasional   Drug use: No   Sexual activity: Not Currently    Birth control/protection: None  Other Topics Concern   Not on file  Social History Narrative   Drinks about 2 cups of caffeine.   Social Determinants of Health   Financial Resource Strain: Medium Risk   Difficulty of Paying Living Expenses: Somewhat hard  Food Insecurity: No Food Insecurity   Worried About Charity fundraiser in the Last Year: Never true   Ran Out of Food in the Last Year: Never true  Transportation Needs: No Transportation Needs   Lack of Transportation (Medical): No   Lack of Transportation (Non-Medical): No  Physical Activity: Sufficiently Active   Days of Exercise per Week: 3 days   Minutes of Exercise per Session: 150+ min  Stress: Not on file  Social Connections: Socially Integrated   Frequency of Communication with Friends and Family: More than three times a week   Frequency of Social Gatherings with Friends and Family: Once a week   Attends Religious Services: More than 4 times per year   Active Member of [employer] or Organizations:  Yes   Attends Archivist Meetings: More than 4 times per year   Marital Status: Married  Human resources officer Violence: Not At Risk   Fear of Current or Ex-Partner: No   Emotionally Abused: No   Physically Abused: No   Sexually Abused: No     Current Outpatient Medications:    atorvastatin (LIPITOR) 80 MG tablet, Take 25 mg by mouth daily., Disp: , Rfl:    B Complex Vitamins (B COMPLEX-B12 PO), Take 1 tablet by mouth daily., Disp: , Rfl:    Casc[place] 450 MG CAPS, Take 450 mg by mouth 2 (two) times daily., Disp: , Rfl:    Cholecalciferol (VITAMIN D3) 1000 units CAPS, Take 1,000 Units by mouth daily at 3 pm., Disp: , Rfl:    losartan (COZAAR) 25 MG tablet, Take 25 mg by mouth daily., Disp: , Rfl:    Melatonin 5 MG TABS, Take 5 mg by mouth at  bedtime. , Disp: , Rfl:    naproxen sodium (ALEVE) 220 MG tablet, Take 220 mg by mouth daily as needed., Disp: , Rfl:    tamsulosin (FLOMAX) 0.4 MG CAPS capsule, Take 1 capsule (0.4 mg total) by mouth daily., Disp: 90 capsule, Rfl: 3   traZODone (DESYREL) 50 MG tablet, Take 25 mg by mouth at bedtime., Disp: , Rfl:    warfarin (COUMADIN) 7.5 MG tablet, Take 1 tablet (7.5 mg total) by mouth daily., Disp: 90 tablet, Rfl: 3   No Known Allergies  ROS Review of Systems  Constitutional: Negative.   HENT: Negative.    Eyes: Negative.   Respiratory: Negative.    Cardiovascular: Negative.   Gastrointestinal: Negative.   Endocrine: Negative.   Genitourinary: Negative.   Musculoskeletal: Negative.   Skin: Negative.   Allergic/Immunologic: Negative.   Neurological: Negative.   Hematological: Negative.   Psychiatric/Behavioral: Negative.    All other systems reviewed and are negative.    Objective:    Physical Exam Vitals reviewed.  Constitutional:      Appearance: Normal appearance.  HENT:     Mouth/Throat:     Mouth: Mucous membranes are moist.  Eyes:     Pupils: Pupils are equal, round, and reactive to light.  Neck:     Vascular: No carotid bruit.  Cardiovascular:     Rate and Rhythm: Normal rate and regular rhythm.     Pulses: Normal pulses.     Heart sounds: Normal heart sounds.  Pulmonary:     Effort: Pulmonary effort is normal.     Breath sounds: Normal breath sounds.  Abdominal:     General: Bowel sounds are normal.     Palpations: Abdomen is soft. There is no hepatomegaly, splenomegaly or mass.     Tenderness: There is no abdominal tenderness.     Hernia: No hernia is present.  Musculoskeletal:     Cervical back: Neck supple.     Right lower leg: No edema.     Left lower leg: No edema.  Skin:    Findings: No rash.  Neurological:     Mental Status: He is alert and oriented to person, place, and time.     Motor: No weakness.  Psychiatric:        Mood and Affect:  Mood normal.        Behavior: Behavior normal.    BP 130/80    Pulse 64    Ht 5\' 7"  (1.702 m)    Wt 165 lb 1.6 oz (74.9 kg)  BMI 25.86 kg/m  Wt Readings from Last 3 Encounters:  08/29/21 165 lb 1.6 oz (74.9 kg)  07/28/21 161 lb 3.2 oz (73.1 kg)  06/28/21 161 lb 3.2 oz (73.1 kg)     Health Maintenance Due  Topic Date Due   Zoster Vaccines- Shingrix (1 of 2) Never done   COVID-19 Vaccine (5 - Booster for Moderna series) 02/24/2021    There are no preventive care reminders to display for this patient.  Lab Results  Component Value Date   TSH 1.77 08/25/2020   Lab Results  Component Value Date   WBC 9.7 08/25/2020   HGB 14.0 08/25/2020   HCT 41.1 08/25/2020   MCV 91.5 08/25/2020   PLT 175 08/25/2020   Lab Results  Component Value Date   NA 142 08/25/2020   K 4.6 08/25/2020   CO2 26 08/25/2020   GLUCOSE 98 08/25/2020   BUN 18 08/25/2020   CREATININE 1.21 (H) 08/25/2020   BILITOT 0.9 08/25/2020   AST 25 08/25/2020   ALT 25 08/25/2020   PROT 7.2 08/25/2020   CALCIUM 9.9 08/25/2020   ANIONGAP 9 07/02/2019   No results found for: CHOL No results found for: HDL No results found for: LDLCALC No results found for: TRIG No results found for: CHOLHDL Lab Results  Component Value Date   HGBA1C 6.5 (H) 08/25/2020      Assessment & Plan:   Problem List Items Addressed This Visit       Cardiovascular and Mediastinum   Atrial fibrillation ([place]) - Primary   Relevant Orders   POCT INR (Completed)   Essential hypertension    stable        Nervous and Auditory   Tremor, essential    stable        Musculoskeletal and Integument   Lumbosacral disc disease    stable        Genitourinary   Benign localized prostatic hyperplasia with lower urinary tract symptoms (LUTS)    stable       No orders of the defined types were placed in this encounter.   Follow-up: No follow-ups on file.    Cletis Athens, MD

## 2021-08-29 NOTE — Progress Notes (Signed)
Established Patient Office Visit  Subjective:  Patient ID: Kyle Santos, male    DOB: 24-Jun-1936  Age: 86 y.o. MRN: [id number]  CC:  Chief Complaint  Patient presents with   Atrial Fibrillation    HPI  Kyle Santos presents for check up  Past Medical History:  Diagnosis Date   A-fib [facility])    Arthritis    BPH (benign prostatic hyperplasia)    Depression    Heart murmur    HOH (hard of hearing)    Hypercholesteremia    Hypertension    Loss of equilibrium    being evaluated at this time.   Pneumonia    history of   Sleep apnea     Past Surgical History:  Procedure Laterality Date   CATARACT EXTRACTION W/PHACO Right 01/09/2017   Procedure: CATARACT EXTRACTION PHACO AND INTRAOCULAR LENS PLACEMENT (IOC);  Surgeon: Birder Robson, MD;  Location: [facility] ORS;  Service: Ophthalmology;  Laterality: Right;  [place] 01:06 AP% 19.7 CDE 13.18 Fluid pack lot # [id number]   CATARACT EXTRACTION W/PHACO Left 08/07/2017   Procedure: CATARACT EXTRACTION PHACO AND INTRAOCULAR LENS PLACEMENT (IOC)-LEFT;  Surgeon: Birder Robson, MD;  Location: [facility] ORS;  Service: Ophthalmology;  Laterality: Left;  [place]  00:43 AP% 15.0 CDE 6.50 Fluid pack lot # [id number]   COLONOSCOPY     JOINT REPLACEMENT     knee   JOINT REPLACEMENT     shoulder    Family History  Problem Relation Age of Onset   Heart disease Mother    Heart disease Father     Social History   Socioeconomic History   Marital status: Married    Spouse name: [name]   Number of children: 3   Years of education: College Degree   Highest education level: Bachelor's degree (e.g., BA, AB, BS)  Occupational History   Occupation: Retired  Tobacco Use   Smoking status: Former    Packs/day: 0.50    Years: 7.00    Pack years: 3.50    Types: Cigarettes    Quit date: 08/22/1963    Years since quitting: 58.0   Smokeless tobacco: Never  Vaping Use   Vaping Use: Never used  Substance and Sexual Activity    Alcohol use: Yes    Alcohol/week: 0.0 standard drinks    Comment: occasional   Drug use: No   Sexual activity: Not Currently    Birth control/protection: None  Other Topics Concern   Not on file  Social History Narrative   Drinks about 2 cups of caffeine.   Social Determinants of Health   Financial Resource Strain: Medium Risk   Difficulty of Paying Living Expenses: Somewhat hard  Food Insecurity: No Food Insecurity   Worried About Charity fundraiser in the Last Year: Never true   Ran Out of Food in the Last Year: Never true  Transportation Needs: No Transportation Needs   Lack of Transportation (Medical): No   Lack of Transportation (Non-Medical): No  Physical Activity: Sufficiently Active   Days of Exercise per Week: 3 days   Minutes of Exercise per Session: 150+ min  Stress: Not on file  Social Connections: Socially Integrated   Frequency of Communication with Friends and Family: More than three times a week   Frequency of Social Gatherings with Friends and Family: Once a week   Attends Religious Services: More than 4 times per year   Active Member of [employer] or Organizations: Yes   Attends Archivist Meetings:  More than 4 times per year   Marital Status: Married  Human resources officer Violence: Not At Risk   Fear of Current or Ex-Partner: No   Emotionally Abused: No   Physically Abused: No   Sexually Abused: No     Current Outpatient Medications:    atorvastatin (LIPITOR) 80 MG tablet, Take 25 mg by mouth daily., Disp: , Rfl:    B Complex Vitamins (B COMPLEX-B12 PO), Take 1 tablet by mouth daily., Disp: , Rfl:    Casc[place] 450 MG CAPS, Take 450 mg by mouth 2 (two) times daily., Disp: , Rfl:    Cholecalciferol (VITAMIN D3) 1000 units CAPS, Take 1,000 Units by mouth daily at 3 pm., Disp: , Rfl:    losartan (COZAAR) 25 MG tablet, Take 25 mg by mouth daily., Disp: , Rfl:    Melatonin 5 MG TABS, Take 5 mg by mouth at bedtime. ,  Disp: , Rfl:    naproxen sodium (ALEVE) 220 MG tablet, Take 220 mg by mouth daily as needed., Disp: , Rfl:    tamsulosin (FLOMAX) 0.4 MG CAPS capsule, Take 1 capsule (0.4 mg total) by mouth daily., Disp: 90 capsule, Rfl: 3   traZODone (DESYREL) 50 MG tablet, Take 25 mg by mouth at bedtime., Disp: , Rfl:    warfarin (COUMADIN) 7.5 MG tablet, Take 1 tablet (7.5 mg total) by mouth daily., Disp: 90 tablet, Rfl: 3   No Known Allergies  ROS Review of Systems    Objective:    Physical Exam  There were no vitals taken for this visit. Wt Readings from Last 3 Encounters:  07/28/21 161 lb 3.2 oz (73.1 kg)  06/28/21 161 lb 3.2 oz (73.1 kg)  05/25/21 164 lb (74.4 kg)     Health Maintenance Due  Topic Date Due   Zoster Vaccines- Shingrix (1 of 2) Never done   COVID-19 Vaccine (5 - Booster for Moderna series) 02/24/2021    There are no preventive care reminders to display for this patient.  Lab Results  Component Value Date   TSH 1.77 08/25/2020   Lab Results  Component Value Date   WBC 9.7 08/25/2020   HGB 14.0 08/25/2020   HCT 41.1 08/25/2020   MCV 91.5 08/25/2020   PLT 175 08/25/2020   Lab Results  Component Value Date   NA 142 08/25/2020   K 4.6 08/25/2020   CO2 26 08/25/2020   GLUCOSE 98 08/25/2020   BUN 18 08/25/2020   CREATININE 1.21 (H) 08/25/2020   BILITOT 0.9 08/25/2020   AST 25 08/25/2020   ALT 25 08/25/2020   PROT 7.2 08/25/2020   CALCIUM 9.9 08/25/2020   ANIONGAP 9 07/02/2019   No results found for: CHOL No results found for: HDL No results found for: LDLCALC No results found for: TRIG No results found for: CHOLHDL Lab Results  Component Value Date   HGBA1C 6.5 (H) 08/25/2020      Assessment & Plan:   Problem List Items Addressed This Visit       Cardiovascular and Mediastinum   Atrial fibrillation ([place]) - Primary   Relevant Orders   POCT INR    No orders of the defined types were placed in this encounter.   Follow-up: No  follow-ups on file.    Cletis Athens, MD

## 2021-08-30 DIAGNOSIS — M25612 Stiffness of left shoulder, not elsewhere classified: Secondary | ICD-10-CM | POA: Diagnosis not present

## 2021-08-30 DIAGNOSIS — M25512 Pain in left shoulder: Secondary | ICD-10-CM | POA: Diagnosis not present

## 2021-08-30 DIAGNOSIS — M6281 Muscle weakness (generalized): Secondary | ICD-10-CM | POA: Diagnosis not present

## 2021-08-31 DIAGNOSIS — M19012 Primary osteoarthritis, left shoulder: Secondary | ICD-10-CM | POA: Diagnosis not present

## 2021-09-19 DIAGNOSIS — M19012 Primary osteoarthritis, left shoulder: Secondary | ICD-10-CM | POA: Diagnosis not present

## 2021-09-28 DIAGNOSIS — M25512 Pain in left shoulder: Secondary | ICD-10-CM | POA: Diagnosis not present

## 2021-09-28 DIAGNOSIS — M6281 Muscle weakness (generalized): Secondary | ICD-10-CM | POA: Diagnosis not present

## 2021-10-03 ENCOUNTER — Other Ambulatory Visit: Payer: Self-pay | Admitting: *Deleted

## 2021-10-03 ENCOUNTER — Encounter: Payer: Self-pay | Admitting: Internal Medicine

## 2021-10-03 ENCOUNTER — Other Ambulatory Visit: Payer: Self-pay

## 2021-10-03 ENCOUNTER — Ambulatory Visit (INDEPENDENT_AMBULATORY_CARE_PROVIDER_SITE_OTHER): Payer: Medicare Other | Admitting: Internal Medicine

## 2021-10-03 VITALS — BP 167/88 | HR 58 | Ht 67.0 in | Wt 166.4 lb

## 2021-10-03 DIAGNOSIS — G4733 Obstructive sleep apnea (adult) (pediatric): Secondary | ICD-10-CM

## 2021-10-03 DIAGNOSIS — G250 Essential tremor: Secondary | ICD-10-CM | POA: Diagnosis not present

## 2021-10-03 DIAGNOSIS — Z9989 Dependence on other enabling machines and devices: Secondary | ICD-10-CM | POA: Diagnosis not present

## 2021-10-03 DIAGNOSIS — I4891 Unspecified atrial fibrillation: Secondary | ICD-10-CM | POA: Diagnosis not present

## 2021-10-03 DIAGNOSIS — K029 Dental caries, unspecified: Secondary | ICD-10-CM

## 2021-10-03 DIAGNOSIS — N401 Enlarged prostate with lower urinary tract symptoms: Secondary | ICD-10-CM

## 2021-10-03 LAB — POCT INR: INR: 2.3 (ref 2.0–3.0)

## 2021-10-03 MED ORDER — AMOXICILLIN 500 MG PO CAPS: 500.0000 mg | ORAL_CAPSULE | Freq: Three times a day (TID) | ORAL | 0 refills | Status: DC

## 2021-10-03 NOTE — Assessment & Plan Note (Signed)
Stable at the present time. 

## 2021-10-03 NOTE — Progress Notes (Signed)
Established Patient Office Visit  Subjective:  Patient ID: Kyle Santos, male    DOB: [birth year]  Age: 86 y.o. MRN: [id number]  CC:  Chief Complaint  Patient presents with   Atrial Fibrillation    Patient here for pt/inr     Atrial Fibrillation Past medical history includes atrial fibrillation.   Raynelle Chary presents for general check  Past Medical History:  Diagnosis Date   A-fib [facility])    Arthritis    BPH (benign prostatic hyperplasia)    Depression    Heart murmur    HOH (hard of hearing)    Hypercholesteremia    Hypertension    Loss of equilibrium    being evaluated at this time.   Pneumonia    history of   Sleep apnea     Past Surgical History:  Procedure Laterality Date   CATARACT EXTRACTION W/PHACO Right 01/09/2017   Procedure: CATARACT EXTRACTION PHACO AND INTRAOCULAR LENS PLACEMENT (IOC);  Surgeon: Birder Robson, MD;  Location: [facility] ORS;  Service: Ophthalmology;  Laterality: Right;  [place] 01:06 AP% 19.7 CDE 13.18 Fluid pack lot # [id number]   CATARACT EXTRACTION W/PHACO Left 08/07/2017   Procedure: CATARACT EXTRACTION PHACO AND INTRAOCULAR LENS PLACEMENT (IOC)-LEFT;  Surgeon: Birder Robson, MD;  Location: [facility] ORS;  Service: Ophthalmology;  Laterality: Left;  [place]  00:43 AP% 15.0 CDE 6.50 Fluid pack lot # [id number]   COLONOSCOPY     JOINT REPLACEMENT     knee   JOINT REPLACEMENT     shoulder    Family History  Problem Relation Age of Onset   Heart disease Mother    Heart disease Father     Social History   Socioeconomic History   Marital status: Married    Spouse name: [name]   Number of children: 3   Years of education: College Degree   Highest education level: Bachelor's degree (e.g., BA, AB, BS)  Occupational History   Occupation: Retired  Tobacco Use   Smoking status: Former    Packs/day: 0.50    Years: 7.00    Pack years: 3.50    Types: Cigarettes    Quit date: 08/22/1963    Years since quitting: 58.1   Smokeless tobacco:  Never  Vaping Use   Vaping Use: Never used  Substance and Sexual Activity   Alcohol use: Yes    Alcohol/week: 0.0 standard drinks    Comment: occasional   Drug use: No   Sexual activity: Not Currently    Birth control/protection: None  Other Topics Concern   Not on file  Social History Narrative   Drinks about 2 cups of caffeine.   Social Determinants of Health   Financial Resource Strain: Medium Risk   Difficulty of Paying Living Expenses: Somewhat hard  Food Insecurity: No Food Insecurity   Worried About Charity fundraiser in the Last Year: Never true   Ran Out of Food in the Last Year: Never true  Transportation Needs: No Transportation Needs   Lack of Transportation (Medical): No   Lack of Transportation (Non-Medical): No  Physical Activity: Sufficiently Active   Days of Exercise per Week: 3 days   Minutes of Exercise per Session: 150+ min  Stress: Not on file  Social Connections: Socially Integrated   Frequency of Communication with Friends and Family: More than three times a week   Frequency of Social Gatherings with Friends and Family: Once a week   Attends Religious Services: More than 4 times per year  Active Member of Clubs or Organizations: Yes   Attends Music therapist: More than 4 times per year   Marital Status: Married  Human resources officer Violence: Not At Risk   Fear of Current or Ex-Partner: No   Emotionally Abused: No   Physically Abused: No   Sexually Abused: No     Current Outpatient Medications:    amoxicillin (AMOXIL) 500 MG capsule, Take 1 capsule (500 mg total) by mouth 3 (three) times daily for 10 days., Disp: 10 capsule, Rfl: 0   atorvastatin (LIPITOR) 80 MG tablet, Take 25 mg by mouth daily., Disp: , Rfl:    B Complex Vitamins (B COMPLEX-B12 PO), Take 1 tablet by mouth daily., Disp: , Rfl:    [place] 450 MG CAPS, Take 450 mg by mouth 2 (two) times daily., Disp: , Rfl:    Cholecalciferol (VITAMIN D3) 1000 units CAPS, Take  1,000 Units by mouth daily at 3 pm., Disp: , Rfl:    losartan (COZAAR) 25 MG tablet, Take 25 mg by mouth daily., Disp: , Rfl:    Melatonin 5 MG TABS, Take 5 mg by mouth at bedtime. , Disp: , Rfl:    naproxen sodium (ALEVE) 220 MG tablet, Take 220 mg by mouth daily as needed., Disp: , Rfl:    tamsulosin (FLOMAX) 0.4 MG CAPS capsule, Take 1 capsule (0.4 mg total) by mouth daily., Disp: 90 capsule, Rfl: 3   traZODone (DESYREL) 50 MG tablet, Take 25 mg by mouth at bedtime., Disp: , Rfl:    warfarin (COUMADIN) 7.5 MG tablet, Take 1 tablet (7.5 mg total) by mouth daily., Disp: 90 tablet, Rfl: 3   No Known Allergies  ROS Review of Systems  Constitutional: Negative.   HENT: Negative.    Eyes: Negative.   Respiratory: Negative.    Cardiovascular: Negative.   Gastrointestinal: Negative.   Endocrine: Negative.   Genitourinary: Negative.   Musculoskeletal: Negative.   Skin: Negative.   Allergic/Immunologic: Negative.   Neurological: Negative.   Hematological: Negative.   Psychiatric/Behavioral: Negative.    All other systems reviewed and are negative.    Objective:    Physical Exam Vitals reviewed.  Constitutional:      Appearance: Normal appearance.  HENT:     Mouth/Throat:     Mouth: Mucous membranes are moist.  Eyes:     Pupils: Pupils are equal, round, and reactive to light.  Neck:     Vascular: No carotid bruit.  Cardiovascular:     Rate and Rhythm: Normal rate and regular rhythm.     Pulses: Normal pulses.     Heart sounds: Normal heart sounds.  Pulmonary:     Effort: Pulmonary effort is normal.     Breath sounds: Normal breath sounds.  Abdominal:     General: Bowel sounds are normal.     Palpations: Abdomen is soft. There is no hepatomegaly, splenomegaly or mass.     Tenderness: There is no abdominal tenderness.     Hernia: No hernia is present.  Musculoskeletal:     Cervical back: Neck supple.     Right lower leg: No edema.     Left lower leg: No edema.  Skin:     Findings: No rash.  Neurological:     Mental Status: He is alert and oriented to person, place, and time.     Motor: No weakness.  Psychiatric:        Mood and Affect: Mood normal.  Behavior: Behavior normal.    BP (!) 167/88    Pulse (!) 58    Ht 5\' 7"  (1.702 m)    Wt 166 lb 6.4 oz (75.5 kg)    BMI 26.06 kg/m  Wt Readings from Last 3 Encounters:  10/03/21 166 lb 6.4 oz (75.5 kg)  08/29/21 165 lb 1.6 oz (74.9 kg)  07/28/21 161 lb 3.2 oz (73.1 kg)     Health Maintenance Due  Topic Date Due   Zoster Vaccines- Shingrix (1 of 2) Never done   COVID-19 Vaccine (5 - Booster for Moderna series) 02/24/2021    There are no preventive care reminders to display for this patient.  Lab Results  Component Value Date   TSH 1.77 08/25/2020   Lab Results  Component Value Date   WBC 9.7 08/25/2020   HGB 14.0 08/25/2020   HCT 41.1 08/25/2020   MCV 91.5 08/25/2020   PLT 175 08/25/2020   Lab Results  Component Value Date   NA 142 08/25/2020   K 4.6 08/25/2020   CO2 26 08/25/2020   GLUCOSE 98 08/25/2020   BUN 18 08/25/2020   CREATININE 1.21 (H) 08/25/2020   BILITOT 0.9 08/25/2020   AST 25 08/25/2020   ALT 25 08/25/2020   PROT 7.2 08/25/2020   CALCIUM 9.9 08/25/2020   ANIONGAP 9 07/02/2019   No results found for: CHOL No results found for: HDL No results found for: LDLCALC No results found for: TRIG No results found for: CHOLHDL Lab Results  Component Value Date   HGBA1C 6.5 (H) 08/25/2020   Pro time is therapeutic   Assessment & Plan:   Problem List Items Addressed This Visit       Cardiovascular and Mediastinum   Atrial fibrillation ([place]) - Primary    Pro time is stable, no bleeding or ecchymosis      Relevant Orders   POCT INR (Completed)     Respiratory   OSA on CPAP    Sleep apnea is under control        Digestive   Tooth decay    Refer to the dentist        Nervous and Auditory   Tremor, essential    Tremors are stable         Genitourinary   Benign localized prostatic hyperplasia with lower urinary tract symptoms (LUTS)    Stable at the present time       No orders of the defined types were placed in this encounter.   Follow-up: No follow-ups on file.    Cletis Athens, MD

## 2021-10-03 NOTE — Assessment & Plan Note (Signed)
Sleep apnea is under control

## 2021-10-03 NOTE — Assessment & Plan Note (Signed)
Refer to the dentist

## 2021-10-03 NOTE — Assessment & Plan Note (Signed)
Pro time is stable, no bleeding or ecchymosis

## 2021-10-03 NOTE — Assessment & Plan Note (Signed)
Tremors are stable

## 2021-10-04 ENCOUNTER — Other Ambulatory Visit: Payer: Self-pay | Admitting: *Deleted

## 2021-10-04 DIAGNOSIS — M6281 Muscle weakness (generalized): Secondary | ICD-10-CM | POA: Diagnosis not present

## 2021-10-04 DIAGNOSIS — M25512 Pain in left shoulder: Secondary | ICD-10-CM | POA: Diagnosis not present

## 2021-10-04 MED ORDER — AMOXICILLIN 500 MG PO CAPS: 500.0000 mg | ORAL_CAPSULE | Freq: Three times a day (TID) | ORAL | 0 refills | Status: DC

## 2021-10-07 DIAGNOSIS — M6281 Muscle weakness (generalized): Secondary | ICD-10-CM | POA: Diagnosis not present

## 2021-10-07 DIAGNOSIS — M25512 Pain in left shoulder: Secondary | ICD-10-CM | POA: Diagnosis not present

## 2021-10-11 DIAGNOSIS — M6281 Muscle weakness (generalized): Secondary | ICD-10-CM | POA: Diagnosis not present

## 2021-10-11 DIAGNOSIS — M25512 Pain in left shoulder: Secondary | ICD-10-CM | POA: Diagnosis not present

## 2021-10-14 DIAGNOSIS — M25512 Pain in left shoulder: Secondary | ICD-10-CM | POA: Diagnosis not present

## 2021-10-14 DIAGNOSIS — M6281 Muscle weakness (generalized): Secondary | ICD-10-CM | POA: Diagnosis not present

## 2021-10-24 DIAGNOSIS — M25512 Pain in left shoulder: Secondary | ICD-10-CM | POA: Diagnosis not present

## 2021-10-24 DIAGNOSIS — M6281 Muscle weakness (generalized): Secondary | ICD-10-CM | POA: Diagnosis not present

## 2021-10-26 DIAGNOSIS — M25512 Pain in left shoulder: Secondary | ICD-10-CM | POA: Diagnosis not present

## 2021-10-26 DIAGNOSIS — M6281 Muscle weakness (generalized): Secondary | ICD-10-CM | POA: Diagnosis not present

## 2021-10-31 ENCOUNTER — Ambulatory Visit: Payer: Medicare Other | Admitting: Internal Medicine

## 2021-11-01 ENCOUNTER — Ambulatory Visit (INDEPENDENT_AMBULATORY_CARE_PROVIDER_SITE_OTHER): Payer: Medicare Other | Admitting: Internal Medicine

## 2021-11-01 ENCOUNTER — Encounter: Payer: Self-pay | Admitting: Internal Medicine

## 2021-11-01 ENCOUNTER — Other Ambulatory Visit: Payer: Self-pay

## 2021-11-01 VITALS — BP 140/80 | HR 56 | Ht 67.0 in | Wt 162.9 lb

## 2021-11-01 DIAGNOSIS — M25512 Pain in left shoulder: Secondary | ICD-10-CM | POA: Diagnosis not present

## 2021-11-01 DIAGNOSIS — R5383 Other fatigue: Secondary | ICD-10-CM | POA: Diagnosis not present

## 2021-11-01 DIAGNOSIS — M519 Unspecified thoracic, thoracolumbar and lumbosacral intervertebral disc disorder: Secondary | ICD-10-CM | POA: Diagnosis not present

## 2021-11-01 DIAGNOSIS — G250 Essential tremor: Secondary | ICD-10-CM

## 2021-11-01 DIAGNOSIS — I10 Essential (primary) hypertension: Secondary | ICD-10-CM | POA: Diagnosis not present

## 2021-11-01 DIAGNOSIS — R5381 Other malaise: Secondary | ICD-10-CM

## 2021-11-01 DIAGNOSIS — M6281 Muscle weakness (generalized): Secondary | ICD-10-CM | POA: Diagnosis not present

## 2021-11-01 DIAGNOSIS — I4891 Unspecified atrial fibrillation: Secondary | ICD-10-CM

## 2021-11-01 LAB — POCT INR: INR: 3 (ref 2.0–3.0)

## 2021-11-01 NOTE — Assessment & Plan Note (Signed)
Chronic problem. 

## 2021-11-01 NOTE — Assessment & Plan Note (Signed)
Stable at the present time. 

## 2021-11-01 NOTE — Progress Notes (Signed)
? ?Established Patient Office Visit ? ?Subjective:  ?Patient ID: Kyle Santos, male    DOB: 01-21-1936  Age: 86 y.o. MRN: [id number] ? ?CC:  ?Chief Complaint  ?Patient presents with  ? Atrial Fibrillation  ?  Patient here today for 1 month PT/INR   ? ? ?Atrial Fibrillation ?Past medical history includes atrial fibrillation.  ? ?Kyle Santos presents for general check and pro time check ? ?Past Medical History:  ?Diagnosis Date  ? A-fib ([place])   ? Arthritis   ? BPH (benign prostatic hyperplasia)   ? Depression   ? Heart murmur   ? HOH (hard of hearing)   ? Hypercholesteremia   ? Hypertension   ? Loss of equilibrium   ? being evaluated at this time.  ? Pneumonia   ? history of  ? Sleep apnea   ? ? ?Past Surgical History:  ?Procedure Laterality Date  ? CATARACT EXTRACTION W/PHACO Right 01/09/2017  ? Procedure: CATARACT EXTRACTION PHACO AND INTRAOCULAR LENS PLACEMENT (IOC);  Surgeon: Birder Robson, MD;  Location: [facility] ORS;  Service: Ophthalmology;  Laterality: Right;  [place] 01:06 ?AP% 19.7 ?CDE 13.18 ?Fluid pack lot # [id number]  ? CATARACT EXTRACTION W/PHACO Left 08/07/2017  ? Procedure: CATARACT EXTRACTION PHACO AND INTRAOCULAR LENS PLACEMENT (IOC)-LEFT;  Surgeon: Birder Robson, MD;  Location: [facility] ORS;  Service: Ophthalmology;  Laterality: Left;  [place]  00:43 ?AP% 15.0 ?CDE 6.50 ?Fluid pack lot # [id number]  ? COLONOSCOPY    ? JOINT REPLACEMENT    ? knee  ? JOINT REPLACEMENT    ? shoulder  ? ? ?Family History  ?Problem Relation Age of Onset  ? Heart disease Mother   ? Heart disease Father   ? ? ?Social History  ? ?Socioeconomic History  ? Marital status: Married  ?  Spouse name: [name]  ? Number of children: 3  ? Years of education: College Degree  ? Highest education level: Bachelor's degree (e.g., BA, AB, BS)  ?Occupational History  ? Occupation: Retired  ?Tobacco Use  ? Smoking status: Former  ?  Packs/day: 0.50  ?  Years: 7.00  ?  Pack years: 3.50  ?  Types: Cigarettes  ?  Quit date: 08/22/1963  ?  Years since  quitting: 58.2  ? Smokeless tobacco: Never  ?Vaping Use  ? Vaping Use: Never used  ?Substance and Sexual Activity  ? Alcohol use: Yes  ?  Alcohol/week: 0.0 standard drinks  ?  Comment: occasional  ? Drug use: No  ? Sexual activity: Not Currently  ?  Birth control/protection: None  ?Other Topics Concern  ? Not on file  ?Social History Narrative  ? Drinks about 2 cups of caffeine.  ? ?Social Determinants of Health  ? ?Financial Resource Strain: Medium Risk  ? Difficulty of Paying Living Expenses: Somewhat hard  ?Food Insecurity: No Food Insecurity  ? Worried About Charity fundraiser in the Last Year: Never true  ? Ran Out of Food in the Last Year: Never true  ?Transportation Needs: No Transportation Needs  ? Lack of Transportation (Medical): No  ? Lack of Transportation (Non-Medical): No  ?Physical Activity: Sufficiently Active  ? Days of Exercise per Week: 3 days  ? Minutes of Exercise per Session: 150+ min  ?Stress: Not on file  ?Social Connections: Socially Integrated  ? Frequency of Communication with Friends and Family: More than three times a week  ? Frequency of Social Gatherings with Friends and Family: Once a week  ? Attends Religious  Services: More than 4 times per year  ? Active Member of Clubs or Organizations: Yes  ? Attends Archivist Meetings: More than 4 times per year  ? Marital Status: Married  ?Intimate Partner Violence: Not At Risk  ? Fear of Current or Ex-Partner: No  ? Emotionally Abused: No  ? Physically Abused: No  ? Sexually Abused: No  ? ? ? ?Current Outpatient Medications:  ?  atorvastatin (LIPITOR) 80 MG tablet, Take 25 mg by mouth daily., Disp: , Rfl:  ?  B Complex Vitamins (B COMPLEX-B12 PO), Take 1 tablet by mouth daily., Disp: , Rfl:  ?  C[place] 450 MG CAPS, Take 450 mg by mouth 2 (two) times daily., Disp: , Rfl:  ?  Cholecalciferol (VITAMIN D3) 1000 units CAPS, Take 1,000 Units by mouth daily at 3 pm., Disp: , Rfl:  ?  losartan (COZAAR) 25 MG tablet, Take 25 mg by  mouth daily., Disp: , Rfl:  ?  Melatonin 5 MG TABS, Take 5 mg by mouth at bedtime. , Disp: , Rfl:  ?  naproxen sodium (ALEVE) 220 MG tablet, Take 220 mg by mouth daily as needed., Disp: , Rfl:  ?  tamsulosin (FLOMAX) 0.4 MG CAPS capsule, Take 1 capsule (0.4 mg total) by mouth daily., Disp: 90 capsule, Rfl: 3 ?  traZODone (DESYREL) 50 MG tablet, Take 25 mg by mouth at bedtime., Disp: , Rfl:  ?  warfarin (COUMADIN) 7.5 MG tablet, Take 1 tablet (7.5 mg total) by mouth daily., Disp: 90 tablet, Rfl: 3  ? ?No Known Allergies ? ?ROS ?Review of Systems  ?Constitutional: Negative.   ?HENT: Negative.    ?Eyes: Negative.   ?Respiratory: Negative.    ?Cardiovascular: Negative.   ?Gastrointestinal: Negative.   ?Endocrine: Negative.   ?Genitourinary: Negative.   ?Musculoskeletal: Negative.   ?Skin: Negative.   ?Allergic/Immunologic: Negative.   ?Neurological: Negative.   ?Hematological: Negative.   ?Psychiatric/Behavioral: Negative.    ?All other systems reviewed and are negative. ? ?  ?Objective:  ?  ?Physical Exam ?Vitals reviewed.  ?Constitutional:   ?   Appearance: Normal appearance.  ?HENT:  ?   Mouth/Throat:  ?   Mouth: Mucous membranes are moist.  ?Eyes:  ?   Pupils: Pupils are equal, round, and reactive to light.  ?Neck:  ?   Vascular: No carotid bruit.  ?Cardiovascular:  ?   Rate and Rhythm: Normal rate and regular rhythm.  ?   Pulses: Normal pulses.  ?   Heart sounds: Normal heart sounds.  ?Pulmonary:  ?   Effort: Pulmonary effort is normal.  ?   Breath sounds: Normal breath sounds.  ?Abdominal:  ?   General: Bowel sounds are normal.  ?   Palpations: Abdomen is soft. There is no hepatomegaly, splenomegaly or mass.  ?   Tenderness: There is no abdominal tenderness.  ?   Hernia: No hernia is present.  ?Musculoskeletal:  ?   Cervical back: Neck supple.  ?   Right lower leg: No edema.  ?   Left lower leg: No edema.  ?Skin: ?   Findings: No rash.  ?Neurological:  ?   Mental Status: He is alert and oriented to person, place,  and time.  ?   Motor: No weakness.  ?Psychiatric:     ?   Mood and Affect: Mood normal.     ?   Behavior: Behavior normal.  ? ? ?BP 140/80   Pulse (!) 56   Ht '5\' 7"'$  (  1.702 m)   Wt 162 lb 14.4 oz (73.9 kg)   BMI 25.51 kg/m?  ?Wt Readings from Last 3 Encounters:  ?11/01/21 162 lb 14.4 oz (73.9 kg)  ?10/03/21 166 lb 6.4 oz (75.5 kg)  ?08/29/21 165 lb 1.6 oz (74.9 kg)  ? ? ? ?Health Maintenance Due  ?Topic Date Due  ? Zoster Vaccines- Shingrix (1 of 2) Never done  ? COVID-19 Vaccine (5 - Booster for Moderna series) 02/24/2021  ? ? ?There are no preventive care reminders to display for this patient. ? ?Lab Results  ?Component Value Date  ? TSH 1.77 08/25/2020  ? ?Lab Results  ?Component Value Date  ? WBC 9.7 08/25/2020  ? HGB 14.0 08/25/2020  ? HCT 41.1 08/25/2020  ? MCV 91.5 08/25/2020  ? PLT 175 08/25/2020  ? ?Lab Results  ?Component Value Date  ? NA 142 08/25/2020  ? K 4.6 08/25/2020  ? CO2 26 08/25/2020  ? GLUCOSE 98 08/25/2020  ? BUN 18 08/25/2020  ? CREATININE 1.21 (H) 08/25/2020  ? BILITOT 0.9 08/25/2020  ? AST 25 08/25/2020  ? ALT 25 08/25/2020  ? PROT 7.2 08/25/2020  ? CALCIUM 9.9 08/25/2020  ? ANIONGAP 9 07/02/2019  ? ?No results found for: CHOL ?No results found for: HDL ?No results found for: [place] ?No results found for: TRIG ?No results found for: CHOLHDL ?Lab Results  ?Component Value Date  ? HGBA1C 6.5 (H) 08/25/2020  ? ? ?  ?Assessment & Plan:  ? ?Problem List Items Addressed This Visit   ? ?  ? Cardiovascular and Mediastinum  ? Atrial fibrillation ([place]) - Primary  ?  Stable at the present time ?  ?  ? Relevant Orders  ? POCT INR (Completed)  ? Essential hypertension  ?   Patient denies any chest pain or shortness of breath there is no history of palpitation or paroxysmal nocturnal dyspnea ?  patient was advised to follow low-salt low-cholesterol diet ? ?  ideally I want to keep systolic blood pressure below 130 mmHg, patient was asked to check blood pressure one times a week and give me a report on  that.  Patient will be follow-up in 3 months  or earlier as needed, patient will call me back for any change in the cardiovascular symptoms ?Patient was advised to buy a book from local bookstore concerning bloo

## 2021-11-01 NOTE — Assessment & Plan Note (Signed)
Chronic problem due to parkinsonian disease ?

## 2021-11-01 NOTE — Addendum Note (Signed)
Addended by: Alois Cliche on: 11/01/2021 11:58 AM ? ? Modules accepted: Orders ? ?

## 2021-11-01 NOTE — Assessment & Plan Note (Signed)

## 2021-11-02 LAB — CBC WITH DIFFERENTIAL/PLATELET
Absolute Monocytes: 475 cells/uL (ref 200–950)
Basophils Absolute: 22 cells/uL (ref 0–200)
Basophils Relative: 0.4 %
Eosinophils Absolute: 92 cells/uL (ref 15–500)
Eosinophils Relative: 1.7 %
HCT: 41.3 % (ref 38.5–50.0)
Hemoglobin: 13.7 g/dL (ref 13.2–17.1)
Lymphs Abs: 1755 cells/uL (ref 850–3900)
MCH: 30.9 pg (ref 27.0–33.0)
MCHC: 33.2 g/dL (ref 32.0–36.0)
MCV: 93.2 fL (ref 80.0–100.0)
MPV: 10.5 fL (ref 7.5–12.5)
Monocytes Relative: 8.8 %
Neutro Abs: 3056 cells/uL (ref 1500–7800)
Neutrophils Relative %: 56.6 %
Platelets: 163 10*3/uL (ref 140–400)
RBC: 4.43 10*6/uL (ref 4.20–5.80)
RDW: 13 % (ref 11.0–15.0)
Total Lymphocyte: 32.5 %
WBC: 5.4 10*3/uL (ref 3.8–10.8)

## 2021-11-02 LAB — COMPLETE METABOLIC PANEL WITH GFR
AG Ratio: 1.4 (calc) (ref 1.0–2.5)
ALT: 26 U/L (ref 9–46)
AST: 26 U/L (ref 10–35)
Albumin: 4.1 g/dL (ref 3.6–5.1)
Alkaline phosphatase (APISO): 82 U/L (ref 35–144)
BUN/Creatinine Ratio: 22 (calc) (ref 6–22)
BUN: 26 mg/dL — ABNORMAL HIGH (ref 7–25)
CO2: 23 mmol/L (ref 20–32)
Calcium: 9.2 mg/dL (ref 8.6–10.3)
Chloride: 108 mmol/L (ref 98–110)
Creat: 1.19 mg/dL (ref 0.70–1.22)
Globulin: 3 g/dL (calc) (ref 1.9–3.7)
Glucose, Bld: 99 mg/dL (ref 65–99)
Potassium: 4.4 mmol/L (ref 3.5–5.3)
Sodium: 141 mmol/L (ref 135–146)
Total Bilirubin: 1 mg/dL (ref 0.2–1.2)
Total Protein: 7.1 g/dL (ref 6.1–8.1)
eGFR: 60 mL/min/{1.73_m2} (ref >=60)

## 2021-11-04 DIAGNOSIS — H43813 Vitreous degeneration, bilateral: Secondary | ICD-10-CM | POA: Diagnosis not present

## 2021-11-04 DIAGNOSIS — M25512 Pain in left shoulder: Secondary | ICD-10-CM | POA: Diagnosis not present

## 2021-11-04 DIAGNOSIS — M6281 Muscle weakness (generalized): Secondary | ICD-10-CM | POA: Diagnosis not present

## 2021-11-08 DIAGNOSIS — M6281 Muscle weakness (generalized): Secondary | ICD-10-CM | POA: Diagnosis not present

## 2021-11-08 DIAGNOSIS — M25512 Pain in left shoulder: Secondary | ICD-10-CM | POA: Diagnosis not present

## 2021-11-11 DIAGNOSIS — M6281 Muscle weakness (generalized): Secondary | ICD-10-CM | POA: Diagnosis not present

## 2021-11-11 DIAGNOSIS — M25512 Pain in left shoulder: Secondary | ICD-10-CM | POA: Diagnosis not present

## 2021-11-14 DIAGNOSIS — M6281 Muscle weakness (generalized): Secondary | ICD-10-CM | POA: Diagnosis not present

## 2021-11-14 DIAGNOSIS — M25512 Pain in left shoulder: Secondary | ICD-10-CM | POA: Diagnosis not present

## 2021-11-15 DIAGNOSIS — I6389 Other cerebral infarction: Secondary | ICD-10-CM | POA: Diagnosis not present

## 2021-11-15 DIAGNOSIS — I10 Essential (primary) hypertension: Secondary | ICD-10-CM | POA: Diagnosis not present

## 2021-11-15 DIAGNOSIS — I4891 Unspecified atrial fibrillation: Secondary | ICD-10-CM | POA: Diagnosis not present

## 2021-11-15 DIAGNOSIS — I2510 Atherosclerotic heart disease of native coronary artery without angina pectoris: Secondary | ICD-10-CM | POA: Diagnosis not present

## 2021-11-15 DIAGNOSIS — E782 Mixed hyperlipidemia: Secondary | ICD-10-CM | POA: Diagnosis not present

## 2021-11-15 DIAGNOSIS — I340 Nonrheumatic mitral (valve) insufficiency: Secondary | ICD-10-CM | POA: Diagnosis not present

## 2021-11-17 DIAGNOSIS — M25512 Pain in left shoulder: Secondary | ICD-10-CM | POA: Diagnosis not present

## 2021-11-17 DIAGNOSIS — M6281 Muscle weakness (generalized): Secondary | ICD-10-CM | POA: Diagnosis not present

## 2021-11-21 DIAGNOSIS — M6281 Muscle weakness (generalized): Secondary | ICD-10-CM | POA: Diagnosis not present

## 2021-11-21 DIAGNOSIS — M25512 Pain in left shoulder: Secondary | ICD-10-CM | POA: Diagnosis not present

## 2021-11-29 DIAGNOSIS — M25512 Pain in left shoulder: Secondary | ICD-10-CM | POA: Diagnosis not present

## 2021-11-29 DIAGNOSIS — M6281 Muscle weakness (generalized): Secondary | ICD-10-CM | POA: Diagnosis not present

## 2021-11-30 DIAGNOSIS — M25512 Pain in left shoulder: Secondary | ICD-10-CM | POA: Diagnosis not present

## 2021-11-30 DIAGNOSIS — M19012 Primary osteoarthritis, left shoulder: Secondary | ICD-10-CM | POA: Diagnosis not present

## 2021-12-05 ENCOUNTER — Ambulatory Visit (INDEPENDENT_AMBULATORY_CARE_PROVIDER_SITE_OTHER): Payer: Medicare Other | Admitting: Internal Medicine

## 2021-12-05 ENCOUNTER — Encounter: Payer: Self-pay | Admitting: Internal Medicine

## 2021-12-05 VITALS — BP 147/94 | HR 68 | Ht 67.0 in | Wt 164.8 lb

## 2021-12-05 DIAGNOSIS — I4811 Longstanding persistent atrial fibrillation: Secondary | ICD-10-CM

## 2021-12-05 DIAGNOSIS — G20 Parkinson's disease: Secondary | ICD-10-CM | POA: Diagnosis not present

## 2021-12-05 DIAGNOSIS — G4733 Obstructive sleep apnea (adult) (pediatric): Secondary | ICD-10-CM

## 2021-12-05 DIAGNOSIS — N401 Enlarged prostate with lower urinary tract symptoms: Secondary | ICD-10-CM

## 2021-12-05 DIAGNOSIS — M519 Unspecified thoracic, thoracolumbar and lumbosacral intervertebral disc disorder: Secondary | ICD-10-CM

## 2021-12-05 DIAGNOSIS — Z9989 Dependence on other enabling machines and devices: Secondary | ICD-10-CM | POA: Diagnosis not present

## 2021-12-05 LAB — POCT INR: INR: 2.5 (ref 2.0–3.0)

## 2021-12-05 NOTE — Assessment & Plan Note (Signed)
Refer to urologist 

## 2021-12-05 NOTE — Assessment & Plan Note (Signed)
Stable

## 2021-12-05 NOTE — Progress Notes (Signed)
? ?Established Patient Office Visit ? ?Subjective:  ?Patient ID: Kyle Santos, male    DOB: [birth year]  Age: 86 y.o. MRN: [id number] ? ?CC:  ?Chief Complaint  ?Patient presents with  ? Atrial Fibrillation  ? ? ?Atrial Fibrillation ?Past medical history includes atrial fibrillation.  ? ?Kyle Santos presents for check up ? ?Past Medical History:  ?Diagnosis Date  ? A-fib ([place])   ? Arthritis   ? BPH (benign prostatic hyperplasia)   ? Depression   ? Heart murmur   ? HOH (hard of hearing)   ? Hypercholesteremia   ? Hypertension   ? Loss of equilibrium   ? being evaluated at this time.  ? Pneumonia   ? history of  ? Sleep apnea   ? ? ?Past Surgical History:  ?Procedure Laterality Date  ? CATARACT EXTRACTION W/PHACO Right 01/09/2017  ? Procedure: CATARACT EXTRACTION PHACO AND INTRAOCULAR LENS PLACEMENT (IOC);  Surgeon: Birder Robson, MD;  Location: [facility] ORS;  Service: Ophthalmology;  Laterality: Right;  [place] 01:06 ?AP% 19.7 ?CDE 13.18 ?Fluid pack lot # [id number]  ? CATARACT EXTRACTION W/PHACO Left 08/07/2017  ? Procedure: CATARACT EXTRACTION PHACO AND INTRAOCULAR LENS PLACEMENT (IOC)-LEFT;  Surgeon: Birder Robson, MD;  Location: [facility] ORS;  Service: Ophthalmology;  Laterality: Left;  [place]  00:43 ?AP% 15.0 ?CDE 6.50 ?Fluid pack lot # [id number]  ? COLONOSCOPY    ? JOINT REPLACEMENT    ? knee  ? JOINT REPLACEMENT    ? shoulder  ? ? ?Family History  ?Problem Relation Age of Onset  ? Heart disease Mother   ? Heart disease Father   ? ? ?Social History  ? ?Socioeconomic History  ? Marital status: Married  ?  Spouse name: [name]  ? Number of children: 3  ? Years of education: College Degree  ? Highest education level: Bachelor's degree (e.g., BA, AB, BS)  ?Occupational History  ? Occupation: Retired  ?Tobacco Use  ? Smoking status: Former  ?  Packs/day: 0.50  ?  Years: 7.00  ?  Pack years: 3.50  ?  Types: Cigarettes  ?  Quit date: 08/22/1963  ?  Years since quitting: 58.3  ? Smokeless tobacco: Never  ?Vaping Use  ? Vaping Use:  Never used  ?Substance and Sexual Activity  ? Alcohol use: Yes  ?  Alcohol/week: 0.0 standard drinks  ?  Comment: occasional  ? Drug use: No  ? Sexual activity: Not Currently  ?  Birth control/protection: None  ?Other Topics Concern  ? Not on file  ?Social History Narrative  ? Drinks about 2 cups of caffeine.  ? ?Social Determinants of Health  ? ?Financial Resource Strain: Medium Risk  ? Difficulty of Paying Living Expenses: Somewhat hard  ?Food Insecurity: No Food Insecurity  ? Worried About Charity fundraiser in the Last Year: Never true  ? Ran Out of Food in the Last Year: Never true  ?Transportation Needs: No Transportation Needs  ? Lack of Transportation (Medical): No  ? Lack of Transportation (Non-Medical): No  ?Physical Activity: Sufficiently Active  ? Days of Exercise per Week: 3 days  ? Minutes of Exercise per Session: 150+ min  ?Stress: Not on file  ?Social Connections: Socially Integrated  ? Frequency of Communication with Friends and Family: More than three times a week  ? Frequency of Social Gatherings with Friends and Family: Once a week  ? Attends Religious Services: More than 4 times per year  ? Active Member of Clubs or Organizations:  Yes  ? Attends Club or Organization Meetings: More than 4 times per year  ? Marital Status: Married  ?Intimate Partner Violence: Not At Risk  ? Fear of Current or Ex-Partner: No  ? Emotionally Abused: No  ? Physically Abused: No  ? Sexually Abused: No  ? ? ? ?Current Outpatient Medications:  ?  atorvastatin (LIPITOR) 80 MG tablet, Take 25 mg by mouth daily., Disp: , Rfl:  ?  B Complex Vitamins (B COMPLEX-B12 PO), Take 1 tablet by mouth daily., Disp: , Rfl:  ?  Casc[place] 450 MG CAPS, Take 450 mg by mouth 2 (two) times daily., Disp: , Rfl:  ?  Cholecalciferol (VITAMIN D3) 1000 units CAPS, Take 1,000 Units by mouth daily at 3 pm., Disp: , Rfl:  ?  losartan (COZAAR) 25 MG tablet, Take 25 mg by mouth daily., Disp: , Rfl:  ?  Melatonin 5 MG TABS, Take 5 mg by mouth at  bedtime. , Disp: , Rfl:  ?  naproxen sodium (ALEVE) 220 MG tablet, Take 220 mg by mouth daily as needed., Disp: , Rfl:  ?  tamsulosin (FLOMAX) 0.4 MG CAPS capsule, Take 1 capsule (0.4 mg total) by mouth daily., Disp: 90 capsule, Rfl: 3 ?  traZODone (DESYREL) 50 MG tablet, Take 25 mg by mouth at bedtime., Disp: , Rfl:  ?  warfarin (COUMADIN) 7.5 MG tablet, Take 1 tablet (7.5 mg total) by mouth daily., Disp: 90 tablet, Rfl: 3  ? ?No Known Allergies ? ?ROS ?Review of Systems  ?Constitutional: Negative.   ?HENT: Negative.    ?Eyes: Negative.   ?Respiratory: Negative.    ?Cardiovascular: Negative.   ?Gastrointestinal: Negative.   ?Endocrine: Negative.   ?Genitourinary: Negative.   ?Musculoskeletal: Negative.   ?Skin: Negative.   ?Allergic/Immunologic: Negative.   ?Neurological: Negative.   ?Hematological: Negative.   ?Psychiatric/Behavioral: Negative.    ?All other systems reviewed and are negative. ? ?  ?Objective:  ?  ?Physical Exam ?Vitals reviewed.  ?Constitutional:   ?   Appearance: Normal appearance.  ?HENT:  ?   Mouth/Throat:  ?   Mouth: Mucous membranes are moist.  ?Eyes:  ?   Pupils: Pupils are equal, round, and reactive to light.  ?Neck:  ?   Vascular: No carotid bruit.  ?Cardiovascular:  ?   Rate and Rhythm: Normal rate and regular rhythm.  ?   Pulses: Normal pulses.  ?   Heart sounds: Normal heart sounds.  ?Pulmonary:  ?   Effort: Pulmonary effort is normal.  ?   Breath sounds: Normal breath sounds.  ?Abdominal:  ?   General: Bowel sounds are normal.  ?   Palpations: Abdomen is soft. There is no hepatomegaly, splenomegaly or mass.  ?   Tenderness: There is no abdominal tenderness.  ?   Hernia: No hernia is present.  ?Musculoskeletal:  ?   Cervical back: Neck supple.  ?   Right lower leg: No edema.  ?   Left lower leg: No edema.  ?Skin: ?   Findings: No rash.  ?Neurological:  ?   Mental Status: He is alert and oriented to person, place, and time.  ?   Motor: No weakness.  ?Psychiatric:     ?   Mood and Affect:  Mood normal.     ?   Behavior: Behavior normal.  ? ? ?BP (!) 147/94   Pulse 68   Ht 5' 7" (1.702 m)   Wt 164 lb 12.8 oz (74.8 kg)   BMI 25.81   kg/m?  ?Wt Readings from Last 3 Encounters:  ?12/05/21 164 lb 12.8 oz (74.8 kg)  ?11/01/21 162 lb 14.4 oz (73.9 kg)  ?10/03/21 166 lb 6.4 oz (75.5 kg)  ? ? ? ?Health Maintenance Due  ?Topic Date Due  ? Zoster Vaccines- Shingrix (1 of 2) Never done  ? COVID-19 Vaccine (5 - Booster for Moderna series) 02/24/2021  ? ? ?There are no preventive care reminders to display for this patient. ? ?Lab Results  ?Component Value Date  ? TSH 1.77 08/25/2020  ? ?Lab Results  ?Component Value Date  ? WBC 5.4 11/01/2021  ? HGB 13.7 11/01/2021  ? HCT 41.3 11/01/2021  ? MCV 93.2 11/01/2021  ? PLT 163 11/01/2021  ? ?Lab Results  ?Component Value Date  ? NA 141 11/01/2021  ? K 4.4 11/01/2021  ? CO2 23 11/01/2021  ? GLUCOSE 99 11/01/2021  ? BUN 26 (H) 11/01/2021  ? CREATININE 1.19 11/01/2021  ? BILITOT 1.0 11/01/2021  ? AST 26 11/01/2021  ? ALT 26 11/01/2021  ? PROT 7.1 11/01/2021  ? CALCIUM 9.2 11/01/2021  ? ANIONGAP 9 07/02/2019  ? EGFR 60 11/01/2021  ? ?No results found for: CHOL ?No results found for: HDL ?No results found for: LDLCALC ?No results found for: TRIG ?No results found for: CHOLHDL ?Lab Results  ?Component Value Date  ? HGBA1C 6.5 (H) 08/25/2020  ? ? ?  ?Assessment & Plan:  ? ?Problem List Items Addressed This Visit   ? ?  ? Cardiovascular and Mediastinum  ? Atrial fibrillation ([facility]) - Primary  ?  Stable time ? ?  ?  ? Relevant Orders  ? POCT INR (Completed)  ?  ? Respiratory  ? OSA on CPAP  ?  Patient does not use the CPAP anymore ? ?  ?  ?  ? Nervous and Auditory  ? 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  Stable ? ?  ?  ?  ? Musculoskeletal and Integument  ? Lumbosacral disc disease  ?  Stable ? ?  ?  ?  ? Genitourinary  ? Benign localized prostatic hyperplasia with lower urinary tract symptoms (LUTS)  ?  Refer to urologist ? ?  ?  ? ? ?No orders of the defined types were placed in this  encounter. ? ? ?Follow-up: No follow-ups on file.  ? ? ?Javed Masoud, MD ?

## 2021-12-05 NOTE — Assessment & Plan Note (Signed)
Patient does not use the CPAP anymore ?

## 2021-12-05 NOTE — Assessment & Plan Note (Signed)
Stable time ?

## 2022-01-04 ENCOUNTER — Ambulatory Visit (INDEPENDENT_AMBULATORY_CARE_PROVIDER_SITE_OTHER): Payer: Medicare Other | Admitting: Internal Medicine

## 2022-01-04 ENCOUNTER — Encounter: Payer: Self-pay | Admitting: Internal Medicine

## 2022-01-04 VITALS — BP 140/85 | HR 63 | Ht 67.0 in | Wt 164.5 lb

## 2022-01-04 DIAGNOSIS — Z9989 Dependence on other enabling machines and devices: Secondary | ICD-10-CM

## 2022-01-04 DIAGNOSIS — N200 Calculus of kidney: Secondary | ICD-10-CM

## 2022-01-04 DIAGNOSIS — G20 Parkinson's disease: Secondary | ICD-10-CM

## 2022-01-04 DIAGNOSIS — G4733 Obstructive sleep apnea (adult) (pediatric): Secondary | ICD-10-CM

## 2022-01-04 DIAGNOSIS — I4891 Unspecified atrial fibrillation: Secondary | ICD-10-CM

## 2022-01-04 LAB — POCT INR: INR: 2.7 (ref 2.0–3.0)

## 2022-01-04 NOTE — Progress Notes (Signed)
? ?Established Patient Office Visit ? ?Subjective:  ?Patient ID: Kyle Santos, male    DOB: July 14, 1936  Age: 86 y.o. MRN: [id number] ? ?CC:  ?Chief Complaint  ?Patient presents with  ? Atrial Fibrillation  ? ? ?Atrial Fibrillation ?Past medical history includes atrial fibrillation.  ? ?Kyle Santos presents for protime check ?Past Medical History:  ?Diagnosis Date  ? A-fib ([place])   ? Arthritis   ? BPH (benign prostatic hyperplasia)   ? Depression   ? Heart murmur   ? HOH (hard of hearing)   ? Hypercholesteremia   ? Hypertension   ? Loss of equilibrium   ? being evaluated at this time.  ? Pneumonia   ? history of  ? Sleep apnea   ? ? ?Past Surgical History:  ?Procedure Laterality Date  ? CATARACT EXTRACTION W/PHACO Right 01/09/2017  ? Procedure: CATARACT EXTRACTION PHACO AND INTRAOCULAR LENS PLACEMENT (IOC);  Surgeon: Birder Robson, MD;  Location: [facility];  Service: Ophthalmology;  Laterality: Right;  [place] 01:06 ?AP% 19.7 ?CDE 13.18 ?Fluid pack lot # [id number]  ? CATARACT EXTRACTION W/PHACO Left 08/07/2017  ? Procedure: CATARACT EXTRACTION PHACO AND INTRAOCULAR LENS PLACEMENT (IOC)-LEFT;  Surgeon: Birder Robson, MD;  Location: [facility];  Service: Ophthalmology;  Laterality: Left;  [place]  00:43 ?AP% 15.0 ?CDE 6.50 ?Fluid pack lot # [id number]  ? COLONOSCOPY    ? JOINT REPLACEMENT    ? knee  ? JOINT REPLACEMENT    ? shoulder  ? ? ?Family History  ?Problem Relation Age of Onset  ? Heart disease Mother   ? Heart disease Father   ? ? ?Social History  ? ?Socioeconomic History  ? Marital status: Married  ?  Spouse name: [name]  ? Number of children: 3  ? Years of education: College Degree  ? Highest education level: Bachelor's degree (e.g., BA, AB, BS)  ?Occupational History  ? Occupation: Retired  ?Tobacco Use  ? Smoking status: Former  ?  Packs/day: 0.50  ?  Years: 7.00  ?  Pack years: 3.50  ?  Types: Cigarettes  ?  Quit date: 08/22/1963  ?  Years since quitting: 58.4  ? Smokeless tobacco: Never  ?Vaping Use  ? Vaping Use:  Never used  ?Substance and Sexual Activity  ? Alcohol use: Yes  ?  Alcohol/week: 0.0 standard drinks  ?  Comment: occasional  ? Drug use: No  ? Sexual activity: Not Currently  ?  Birth control/protection: None  ?Other Topics Concern  ? Not on file  ?Social History Narrative  ? Drinks about 2 cups of caffeine.  ? ?Social Determinants of Health  ? ?Financial Resource Strain: Medium Risk  ? Difficulty of Paying Living Expenses: Somewhat hard  ?Food Insecurity: No Food Insecurity  ? Worried About Charity fundraiser in the Last Year: Never true  ? Ran Out of Food in the Last Year: Never true  ?Transportation Needs: No Transportation Needs  ? Lack of Transportation (Medical): No  ? Lack of Transportation (Non-Medical): No  ?Physical Activity: Sufficiently Active  ? Days of Exercise per Week: 3 days  ? Minutes of Exercise per Session: 150+ min  ?Stress: Not on file  ?Social Connections: Socially Integrated  ? Frequency of Communication with Friends and Family: More than three times a week  ? Frequency of Social Gatherings with Friends and Family: Once a week  ? Attends Religious Services: More than 4 times per year  ? Active Member of Clubs or Organizations: Yes  ?  Attends Archivist Meetings: More than 4 times per year  ? Marital Status: Married  ?Intimate Partner Violence: Not At Risk  ? Fear of Current or Ex-Partner: No  ? Emotionally Abused: No  ? Physically Abused: No  ? Sexually Abused: No  ? ? ? ?Current Outpatient Medications:  ?  atorvastatin (LIPITOR) 80 MG tablet, Take 25 mg by mouth daily., Disp: , Rfl:  ?  B Complex Vitamins (B COMPLEX-B12 PO), Take 1 tablet by mouth daily., Disp: , Rfl:  ?  Cascara [place] 450 MG CAPS, Take 450 mg by mouth 2 (two) times daily., Disp: , Rfl:  ?  Cholecalciferol (VITAMIN D3) 1000 units CAPS, Take 1,000 Units by mouth daily at 3 pm., Disp: , Rfl:  ?  losartan (COZAAR) 25 MG tablet, Take 25 mg by mouth daily., Disp: , Rfl:  ?  Melatonin 5 MG TABS, Take 5 mg by mouth at  bedtime. , Disp: , Rfl:  ?  naproxen sodium (ALEVE) 220 MG tablet, Take 220 mg by mouth daily as needed., Disp: , Rfl:  ?  tamsulosin (FLOMAX) 0.4 MG CAPS capsule, Take 1 capsule (0.4 mg total) by mouth daily., Disp: 90 capsule, Rfl: 3 ?  traZODone (DESYREL) 50 MG tablet, Take 25 mg by mouth at bedtime., Disp: , Rfl:  ?  warfarin (COUMADIN) 7.5 MG tablet, Take 1 tablet (7.5 mg total) by mouth daily., Disp: 90 tablet, Rfl: 3  ? ?No Known Allergies ? ?ROS ?Review of Systems  ?Constitutional: Negative.   ?HENT: Negative.    ?Eyes: Negative.   ?Respiratory: Negative.    ?Cardiovascular: Negative.   ?Gastrointestinal: Negative.   ?Endocrine: Negative.   ?Genitourinary: Negative.   ?Musculoskeletal: Negative.   ?Skin: Negative.   ?Allergic/Immunologic: Negative.   ?Neurological: Negative.   ?Hematological: Negative.   ?Psychiatric/Behavioral: Negative.    ?All other systems reviewed and are negative. ? ?  ?Objective:  ?  ?Physical Exam ?Vitals reviewed.  ?Constitutional:   ?   Appearance: Normal appearance.  ?HENT:  ?   Mouth/Throat:  ?   Mouth: Mucous membranes are moist.  ?Eyes:  ?   Pupils: Pupils are equal, round, and reactive to light.  ?Neck:  ?   Vascular: No carotid bruit.  ?Cardiovascular:  ?   Rate and Rhythm: Normal rate and regular rhythm.  ?   Pulses: Normal pulses.  ?   Heart sounds: Normal heart sounds.  ?Pulmonary:  ?   Effort: Pulmonary effort is normal.  ?   Breath sounds: Normal breath sounds.  ?Abdominal:  ?   General: Bowel sounds are normal.  ?   Palpations: Abdomen is soft. There is no hepatomegaly, splenomegaly or mass.  ?   Tenderness: There is no abdominal tenderness.  ?   Hernia: No hernia is present.  ?Musculoskeletal:  ?   Cervical back: Neck supple.  ?   Right lower leg: No edema.  ?   Left lower leg: No edema.  ?Skin: ?   Findings: No rash.  ?Neurological:  ?   Mental Status: He is alert and oriented to person, place, and time.  ?   Motor: No weakness.  ?Psychiatric:     ?   Mood and Affect:  Mood normal.     ?   Behavior: Behavior normal.  ? ? ?BP 140/85   Pulse 63   Ht 5' 7"  (1.702 m)   Wt 164 lb 8 oz (74.6 kg)   BMI 25.76 kg/m?  ?Wt Readings  from Last 3 Encounters:  ?01/04/22 164 lb 8 oz (74.6 kg)  ?12/05/21 164 lb 12.8 oz (74.8 kg)  ?11/01/21 162 lb 14.4 oz (73.9 kg)  ? ? ? ?Health Maintenance Due  ?Topic Date Due  ? Zoster Vaccines- Shingrix (1 of 2) Never done  ? COVID-19 Vaccine (5 - Booster for Moderna series) 02/24/2021  ? ? ?There are no preventive care reminders to display for this patient. ? ?Lab Results  ?Component Value Date  ? TSH 1.77 08/25/2020  ? ?Lab Results  ?Component Value Date  ? WBC 5.4 11/01/2021  ? HGB 13.7 11/01/2021  ? HCT 41.3 11/01/2021  ? MCV 93.2 11/01/2021  ? PLT 163 11/01/2021  ? ?Lab Results  ?Component Value Date  ? NA 141 11/01/2021  ? K 4.4 11/01/2021  ? CO2 23 11/01/2021  ? GLUCOSE 99 11/01/2021  ? BUN 26 (H) 11/01/2021  ? CREATININE 1.19 11/01/2021  ? BILITOT 1.0 11/01/2021  ? AST 26 11/01/2021  ? ALT 26 11/01/2021  ? PROT 7.1 11/01/2021  ? CALCIUM 9.2 11/01/2021  ? ANIONGAP 9 07/02/2019  ? EGFR 60 11/01/2021  ? ?No results found for: CHOL ?No results found for: HDL ?No results found for: [place] ?No results found for: TRIG ?No results found for: CHOLHDL ?Lab Results  ?Component Value Date  ? HGBA1C 6.5 (H) 08/25/2020  ? ? ?  ?Assessment & Plan:  ? ?Problem List Items Addressed This Visit   ? ?  ? Cardiovascular and Mediastinum  ? Atrial fibrillation ([place]) - Primary  ?  Pro time is stable at the present time no history of TIA, chest is clear heart is irregular abdomen is soft nontender ? ?  ?  ? Relevant Orders  ? POCT INR (Completed)  ?  ? Respiratory  ? OSA on CPAP  ?  Patient uses a CPAP machine ? ?  ?  ?  ? Nervous and Auditory  ? Primary parkinsonism ([place])  ?  Patient has atypical parkinsonian syndrome, he is doing well, gait is getting much better ? ?  ?  ?  ? Genitourinary  ? Kidney stone  ?  Patient does not have any history of renal colic, he sees a  urologist for BPH ? ?  ?  ? ? ?No orders of the defined types were placed in this encounter. ? ? ?Follow-up: No follow-ups on file.  ? ? ?Cletis Athens, MD ?

## 2022-01-04 NOTE — Assessment & Plan Note (Signed)
Pro time is stable at the present time no history of TIA, chest is clear heart is irregular abdomen is soft nontender ?

## 2022-01-04 NOTE — Assessment & Plan Note (Signed)
Patient does not have any history of renal colic, he sees a urologist for BPH ?

## 2022-01-04 NOTE — Assessment & Plan Note (Signed)
Patient uses a CPAP machine ?

## 2022-01-04 NOTE — Assessment & Plan Note (Signed)
Patient has atypical parkinsonian syndrome, he is doing well, gait is getting much better ?

## 2022-02-06 ENCOUNTER — Encounter: Payer: Self-pay | Admitting: Internal Medicine

## 2022-02-06 ENCOUNTER — Ambulatory Visit (INDEPENDENT_AMBULATORY_CARE_PROVIDER_SITE_OTHER): Payer: Medicare Other | Admitting: Internal Medicine

## 2022-02-06 VITALS — BP 150/85 | HR 63 | Ht 67.0 in | Wt 166.7 lb

## 2022-02-06 DIAGNOSIS — I4811 Longstanding persistent atrial fibrillation: Secondary | ICD-10-CM | POA: Diagnosis not present

## 2022-02-06 DIAGNOSIS — G250 Essential tremor: Secondary | ICD-10-CM

## 2022-02-06 DIAGNOSIS — I10 Essential (primary) hypertension: Secondary | ICD-10-CM | POA: Diagnosis not present

## 2022-02-06 DIAGNOSIS — G4733 Obstructive sleep apnea (adult) (pediatric): Secondary | ICD-10-CM

## 2022-02-06 DIAGNOSIS — Z9989 Dependence on other enabling machines and devices: Secondary | ICD-10-CM

## 2022-02-06 LAB — POCT INR

## 2022-02-06 NOTE — Assessment & Plan Note (Signed)
Under control, patient walks with the help of a stick

## 2022-02-06 NOTE — Assessment & Plan Note (Signed)
Under control 

## 2022-02-06 NOTE — Assessment & Plan Note (Signed)
Stable

## 2022-02-06 NOTE — Progress Notes (Signed)
Established Patient Office Visit  Subjective:  Patient ID: Kyle Santos, male    DOB: [birth year]  Age: 86 y.o. MRN: [id number]  CC:  Chief Complaint  Patient presents with   Atrial Fibrillation    Atrial Fibrillation Past medical history includes atrial fibrillation.    [name] presents for follow with chest pain  seen  in [place]  Past Medical History:  Diagnosis Date   A-fib [facility])    Arthritis    BPH (benign prostatic hyperplasia)    Depression    Heart murmur    HOH (hard of hearing)    Hypercholesteremia    Hypertension    Loss of equilibrium    being evaluated at this time.   Pneumonia    history of   Sleep apnea     Past Surgical History:  Procedure Laterality Date   CATARACT EXTRACTION W/PHACO Right 01/09/2017   Procedure: CATARACT EXTRACTION PHACO AND INTRAOCULAR LENS PLACEMENT (IOC);  Surgeon: Birder Robson, MD;  Location: [facility] ORS;  Service: Ophthalmology;  Laterality: Right;  [place] 01:06 AP% 19.7 CDE 13.18 Fluid pack lot # [id number]   CATARACT EXTRACTION W/PHACO Left 08/07/2017   Procedure: CATARACT EXTRACTION PHACO AND INTRAOCULAR LENS PLACEMENT (IOC)-LEFT;  Surgeon: Birder Robson, MD;  Location: [facility] ORS;  Service: Ophthalmology;  Laterality: Left;  [place]  00:43 AP% 15.0 CDE 6.50 Fluid pack lot # [id number]   COLONOSCOPY     JOINT REPLACEMENT     knee   JOINT REPLACEMENT     shoulder    Family History  Problem Relation Age of Onset   Heart disease Mother    Heart disease Father     Social History   Socioeconomic History   Marital status: Married    Spouse name: [name]   Number of children: 3   Years of education: College Degree   Highest education level: Bachelor's degree (e.g., BA, AB, BS)  Occupational History   Occupation: Retired  Tobacco Use   Smoking status: Former    Packs/day: 0.50    Years: 7.00    Total pack years: 3.50    Types: Cigarettes    Quit date: 08/22/1963    Years since quitting: 58.5   Smokeless  tobacco: Never  Vaping Use   Vaping Use: Never used  Substance and Sexual Activity   Alcohol use: Yes    Alcohol/week: 0.0 standard drinks of alcohol    Comment: occasional   Drug use: No   Sexual activity: Not Currently    Birth control/protection: None  Other Topics Concern   Not on file  Social History Narrative   Drinks about 2 cups of caffeine.   Social Determinants of Health   Financial Resource Strain: Medium Risk (05/13/2021)   Overall Financial Resource Strain (CARDIA)    Difficulty of Paying Living Expenses: Somewhat hard  Food Insecurity: No Food Insecurity (05/13/2021)   Hunger Vital Sign    Worried About Running Out of Food in the Last Year: Never true    Ran Out of Food in the Last Year: Never true  Transportation Needs: No Transportation Needs (05/13/2021)   PRAPARE - Hydrologist (Medical): No    Lack of Transportation (Non-Medical): No  Physical Activity: Sufficiently Active (05/13/2021)   Exercise Vital Sign    Days of Exercise per Week: 3 days    Minutes of Exercise per Session: 150+ min  Stress: Not on file  Social Connections: Socially Integrated (05/13/2021)  Social Licensed conveyancer [NHANES]    Frequency of Communication with Friends and Family: More than three times a week    Frequency of Social Gatherings with Friends and Family: Once a week    Attends Religious Services: More than 4 times per year    Active Member of [employer] or Organizations: Yes    Attends Music therapist: More than 4 times per year    Marital Status: Married  Human resources officer Violence: Not At Risk (05/13/2021)   Humiliation, Afraid, Rape, and Kick questionnaire    Fear of Current or Ex-Partner: No    Emotionally Abused: No    Physically Abused: No    Sexually Abused: No     Current Outpatient Medications:    atorvastatin (LIPITOR) 80 MG tablet, Take 25 mg by mouth daily., Disp: , Rfl:    B Complex Vitamins (B COMPLEX-B12  PO), Take 1 tablet by mouth daily., Disp: , Rfl:    C[place] 450 MG CAPS, Take 450 mg by mouth 2 (two) times daily., Disp: , Rfl:    Cholecalciferol (VITAMIN D3) 1000 units CAPS, Take 1,000 Units by mouth daily at 3 pm., Disp: , Rfl:    losartan (COZAAR) 25 MG tablet, Take 25 mg by mouth daily., Disp: , Rfl:    Melatonin 5 MG TABS, Take 5 mg by mouth at bedtime. , Disp: , Rfl:    naproxen sodium (ALEVE) 220 MG tablet, Take 220 mg by mouth daily as needed., Disp: , Rfl:    tamsulosin (FLOMAX) 0.4 MG CAPS capsule, Take 1 capsule (0.4 mg total) by mouth daily., Disp: 90 capsule, Rfl: 3   traZODone (DESYREL) 50 MG tablet, Take 25 mg by mouth at bedtime., Disp: , Rfl:    warfarin (COUMADIN) 7.5 MG tablet, Take 1 tablet (7.5 mg total) by mouth daily., Disp: 90 tablet, Rfl: 3   No Known Allergies  ROS Review of Systems  Constitutional: Negative.   HENT: Negative.    Eyes: Negative.   Respiratory: Negative.    Cardiovascular: Negative.   Gastrointestinal: Negative.   Endocrine: Negative.   Genitourinary: Negative.   Musculoskeletal: Negative.   Skin: Negative.   Allergic/Immunologic: Negative.   Neurological: Negative.   Hematological: Negative.   Psychiatric/Behavioral: Negative.    All other systems reviewed and are negative.     Objective:    Physical Exam Vitals reviewed.  Constitutional:      Appearance: Normal appearance.  HENT:     Mouth/Throat:     Mouth: Mucous membranes are moist.  Eyes:     Pupils: Pupils are equal, round, and reactive to light.  Neck:     Vascular: No carotid bruit.  Cardiovascular:     Rate and Rhythm: Normal rate and regular rhythm.     Pulses: Normal pulses.     Heart sounds: Murmur (systolic) heard.  Pulmonary:     Effort: Pulmonary effort is normal.     Breath sounds: Normal breath sounds.  Abdominal:     General: Bowel sounds are normal.     Palpations: Abdomen is soft. There is no hepatomegaly, splenomegaly or mass.      Tenderness: There is no abdominal tenderness.     Hernia: No hernia is present.  Musculoskeletal:        General: No swelling.     Cervical back: Neck supple.     Right lower leg: No edema.     Left lower leg: No edema.  Skin:  Findings: No rash.  Neurological:     Mental Status: He is alert and oriented to person, place, and time.     Motor: No weakness.  Psychiatric:        Mood and Affect: Mood normal.        Behavior: Behavior normal.     BP (!) 150/85   Pulse 63   Ht 5' 7"  (1.702 m)   Wt 166 lb 11.2 oz (75.6 kg)   BMI 26.11 kg/m  Wt Readings from Last 3 Encounters:  02/06/22 166 lb 11.2 oz (75.6 kg)  01/04/22 164 lb 8 oz (74.6 kg)  12/05/21 164 lb 12.8 oz (74.8 kg)     Health Maintenance Due  Topic Date Due   Zoster Vaccines- Shingrix (1 of 2) Never done   COVID-19 Vaccine (5 - Moderna series) 02/24/2021    There are no preventive care reminders to display for this patient.  Lab Results  Component Value Date   TSH 1.77 08/25/2020   Lab Results  Component Value Date   WBC 5.4 11/01/2021   HGB 13.7 11/01/2021   HCT 41.3 11/01/2021   MCV 93.2 11/01/2021   PLT 163 11/01/2021   Lab Results  Component Value Date   NA 141 11/01/2021   K 4.4 11/01/2021   CO2 23 11/01/2021   GLUCOSE 99 11/01/2021   BUN 26 (H) 11/01/2021   CREATININE 1.19 11/01/2021   BILITOT 1.0 11/01/2021   AST 26 11/01/2021   ALT 26 11/01/2021   PROT 7.1 11/01/2021   CALCIUM 9.2 11/01/2021   ANIONGAP 9 07/02/2019   EGFR 60 11/01/2021   No results found for: "CHOL" No results found for: "HDL" No results found for: "LDLCALC" No results found for: "TRIG" No results found for: "CHOLHDL" Lab Results  Component Value Date   HGBA1C 6.5 (H) 08/25/2020      Assessment & Plan:   Problem List Items Addressed This Visit       Cardiovascular and Mediastinum   Atrial fibrillation ([place]) - Primary    Stable at the present time      Relevant Orders   POCT INR (Completed)    Essential hypertension    Under control        Respiratory   OSA on CPAP    Stable        Nervous and Auditory   Tremor, essential    Under control, patient walks with the help of a stick     Pro time is 29.5 with INR of 2.7.  Patient was advised to continue taking the same dose of Coumadin.  His blood pressure is stable weight is stable.  He has some shaking tremors.  No orders of the defined types were placed in this encounter.   Follow-up: No follow-ups on file.    Cletis Athens, MD

## 2022-02-06 NOTE — Assessment & Plan Note (Signed)
Stable at the present time. 

## 2022-03-08 ENCOUNTER — Ambulatory Visit (INDEPENDENT_AMBULATORY_CARE_PROVIDER_SITE_OTHER): Payer: Medicare Other | Admitting: Internal Medicine

## 2022-03-08 ENCOUNTER — Encounter: Payer: Self-pay | Admitting: Internal Medicine

## 2022-03-08 VITALS — BP 138/76 | HR 63 | Ht 67.0 in | Wt 169.1 lb

## 2022-03-08 DIAGNOSIS — I10 Essential (primary) hypertension: Secondary | ICD-10-CM

## 2022-03-08 DIAGNOSIS — I4891 Unspecified atrial fibrillation: Secondary | ICD-10-CM

## 2022-03-08 DIAGNOSIS — M519 Unspecified thoracic, thoracolumbar and lumbosacral intervertebral disc disorder: Secondary | ICD-10-CM

## 2022-03-08 DIAGNOSIS — G20 Parkinson's disease: Secondary | ICD-10-CM

## 2022-03-08 LAB — POCT INR: INR: 3 (ref 2.0–3.0)

## 2022-03-08 NOTE — Assessment & Plan Note (Signed)
-   Patient's back pain is under control with medication.  - Encouraged the patient to stretch or do yoga as able to help with back pain 

## 2022-03-08 NOTE — Assessment & Plan Note (Signed)
Blood pressure under control 

## 2022-03-08 NOTE — Progress Notes (Signed)
Established Patient Office Visit  Subjective:  Patient ID: Kyle Santos, male    DOB: 11/28/1935  Age: 86 y.o. MRN: [id number]  CC: No chief complaint on file.   HPI  Kyle Santos presents for general checkup.  Patient is known to have atrial fibrillation and he is on Coumadin 7.5 mg p.o. daily his pro time is 32.3 INR is 3.0.  I asked him not to take his Coumadin on Sunday.  He also complained of intermittent headache on the left side.  His neurological examination is unremarkable.  Neck is supple.  Eyesight is good.  He has a history of migraine in the past.  I told him to call me if the headache is persistent at that time we will do a CT scan of the head.  Past Medical History:  Diagnosis Date   A-fib [facility])    Arthritis    BPH (benign prostatic hyperplasia)    Depression    Heart murmur    HOH (hard of hearing)    Hypercholesteremia    Hypertension    Loss of equilibrium    being evaluated at this time.   Pneumonia    history of   Sleep apnea     Past Surgical History:  Procedure Laterality Date   CATARACT EXTRACTION W/PHACO Right 01/09/2017   Procedure: CATARACT EXTRACTION PHACO AND INTRAOCULAR LENS PLACEMENT (IOC);  Surgeon: Birder Robson, MD;  Location: [facility] ORS;  Service: Ophthalmology;  Laterality: Right;  [place] 01:06 AP% 19.7 CDE 13.18 Fluid pack lot # [id number]   CATARACT EXTRACTION W/PHACO Left 08/07/2017   Procedure: CATARACT EXTRACTION PHACO AND INTRAOCULAR LENS PLACEMENT (IOC)-LEFT;  Surgeon: Birder Robson, MD;  Location: [facility] ORS;  Service: Ophthalmology;  Laterality: Left;  [place]  00:43 AP% 15.0 CDE 6.50 Fluid pack lot # [id number]   COLONOSCOPY     JOINT REPLACEMENT     knee   JOINT REPLACEMENT     shoulder    Family History  Problem Relation Age of Onset   Heart disease Mother    Heart disease Father     Social History   Socioeconomic History   Marital status: Married    Spouse name: [name]   Number of children: 3   Years of education:  College Degree   Highest education level: Bachelor's degree (e.g., BA, AB, BS)  Occupational History   Occupation: Retired  Tobacco Use   Smoking status: Former    Packs/day: 0.50    Years: 7.00    Total pack years: 3.50    Types: Cigarettes    Quit date: 08/22/1963    Years since quitting: 58.5   Smokeless tobacco: Never  Vaping Use   Vaping Use: Never used  Substance and Sexual Activity   Alcohol use: Yes    Alcohol/week: 0.0 standard drinks of alcohol    Comment: occasional   Drug use: No   Sexual activity: Not Currently    Birth control/protection: None  Other Topics Concern   Not on file  Social History Narrative   Drinks about 2 cups of caffeine.   Social Determinants of Health   Financial Resource Strain: Medium Risk (05/13/2021)   Overall Financial Resource Strain (CARDIA)    Difficulty of Paying Living Expenses: Somewhat hard  Food Insecurity: No Food Insecurity (05/13/2021)   Hunger Vital Sign    Worried About Running Out of Food in the Last Year: Never true    Ran Out of Food in the Last Year: Never true  Transportation Needs: No Transportation Needs (05/13/2021)   PRAPARE - Hydrologist (Medical): No    Lack of Transportation (Non-Medical): No  Physical Activity: Sufficiently Active (05/13/2021)   Exercise Vital Sign    Days of Exercise per Week: 3 days    Minutes of Exercise per Session: 150+ min  Stress: Not on file  Social Connections: Socially Integrated (05/13/2021)   Social Connection and Isolation Panel [NHANES]    Frequency of Communication with Friends and Family: More than three times a week    Frequency of Social Gatherings with Friends and Family: Once a week    Attends Religious Services: More than 4 times per year    Active Member of [employer] or Organizations: Yes    Attends Music therapist: More than 4 times per year    Marital Status: Married  Human resources officer Violence: Not At Risk (05/13/2021)    Humiliation, Afraid, Rape, and Kick questionnaire    Fear of Current or Ex-Partner: No    Emotionally Abused: No    Physically Abused: No    Sexually Abused: No     Current Outpatient Medications:    atorvastatin (LIPITOR) 80 MG tablet, Take 25 mg by mouth daily., Disp: , Rfl:    B Complex Vitamins (B COMPLEX-B12 PO), Take 1 tablet by mouth daily., Disp: , Rfl:    C[place] 450 MG CAPS, Take 450 mg by mouth 2 (two) times daily., Disp: , Rfl:    Cholecalciferol (VITAMIN D3) 1000 units CAPS, Take 1,000 Units by mouth daily at 3 pm., Disp: , Rfl:    losartan (COZAAR) 25 MG tablet, Take 25 mg by mouth daily., Disp: , Rfl:    Melatonin 5 MG TABS, Take 5 mg by mouth at bedtime. , Disp: , Rfl:    naproxen sodium (ALEVE) 220 MG tablet, Take 220 mg by mouth daily as needed., Disp: , Rfl:    tamsulosin (FLOMAX) 0.4 MG CAPS capsule, Take 1 capsule (0.4 mg total) by mouth daily., Disp: 90 capsule, Rfl: 3   traZODone (DESYREL) 50 MG tablet, Take 25 mg by mouth at bedtime., Disp: , Rfl:    warfarin (COUMADIN) 7.5 MG tablet, Take 1 tablet (7.5 mg total) by mouth daily., Disp: 90 tablet, Rfl: 3   No Known Allergies  ROS Review of Systems  Constitutional: Negative.   HENT: Negative.    Eyes: Negative.   Respiratory: Negative.    Cardiovascular: Negative.   Gastrointestinal: Negative.   Endocrine: Negative.   Genitourinary: Negative.   Musculoskeletal: Negative.   Skin: Negative.   Allergic/Immunologic: Negative.   Neurological: Negative.   Hematological: Negative.   Psychiatric/Behavioral: Negative.    All other systems reviewed and are negative.     Objective:    Physical Exam Vitals reviewed.  Constitutional:      Appearance: Normal appearance.  HENT:     Mouth/Throat:     Mouth: Mucous membranes are moist.  Eyes:     Pupils: Pupils are equal, round, and reactive to light.  Neck:     Vascular: No carotid bruit.  Cardiovascular:     Rate and Rhythm: Normal rate. Rhythm  irregular.     Pulses: Normal pulses.     Heart sounds: Normal heart sounds.  Pulmonary:     Effort: Pulmonary effort is normal.     Breath sounds: Normal breath sounds.  Abdominal:     General: Bowel sounds are normal.     Palpations: Abdomen  is soft. There is no hepatomegaly, splenomegaly or mass.     Tenderness: There is no abdominal tenderness.     Hernia: No hernia is present.  Musculoskeletal:     Cervical back: Neck supple.     Right lower leg: No edema.     Left lower leg: No edema.  Skin:    Findings: No rash.  Neurological:     Mental Status: He is alert and oriented to person, place, and time.     Motor: No weakness.  Psychiatric:        Mood and Affect: Mood normal.        Behavior: Behavior normal.     BP 138/76   Pulse 63   Ht 5' 7"  (1.702 m)   Wt 169 lb 1.6 oz (76.7 kg)   SpO2 94%   BMI 26.48 kg/m  Wt Readings from Last 3 Encounters:  03/08/22 169 lb 1.6 oz (76.7 kg)  02/06/22 166 lb 11.2 oz (75.6 kg)  01/04/22 164 lb 8 oz (74.6 kg)     Health Maintenance Due  Topic Date Due   Zoster Vaccines- Shingrix (1 of 2) Never done   COVID-19 Vaccine (5 - Moderna series) 02/24/2021    There are no preventive care reminders to display for this patient.  Lab Results  Component Value Date   TSH 1.77 08/25/2020   Lab Results  Component Value Date   WBC 5.4 11/01/2021   HGB 13.7 11/01/2021   HCT 41.3 11/01/2021   MCV 93.2 11/01/2021   PLT 163 11/01/2021   Lab Results  Component Value Date   NA 141 11/01/2021   K 4.4 11/01/2021   CO2 23 11/01/2021   GLUCOSE 99 11/01/2021   BUN 26 (H) 11/01/2021   CREATININE 1.19 11/01/2021   BILITOT 1.0 11/01/2021   AST 26 11/01/2021   ALT 26 11/01/2021   PROT 7.1 11/01/2021   CALCIUM 9.2 11/01/2021   ANIONGAP 9 07/02/2019   EGFR 60 11/01/2021   No results found for: "CHOL" No results found for: "HDL" No results found for: "LDLCALC" No results found for: "TRIG" No results found for: "CHOLHDL" Lab Results   Component Value Date   HGBA1C 6.5 (H) 08/25/2020      Assessment & Plan:   Problem List Items Addressed This Visit       Cardiovascular and Mediastinum   Atrial fibrillation ([place]) - Primary    Stable at the present time      Relevant Orders   POCT INR (Completed)   Essential hypertension    Blood pressure under control        Nervous and Auditory   Primary parkinsonism ([place])    There is no change in that        Musculoskeletal and Integument   Lumbosacral disc disease    - Patient's back pain is under control with medication.  - Encouraged the patient to stretch or do yoga as able to help with back pain       No orders of the defined types were placed in this encounter.   Follow-up: No follow-ups on file.    Cletis Athens, MD

## 2022-03-08 NOTE — Assessment & Plan Note (Signed)
Stable at the present time. 

## 2022-03-08 NOTE — Assessment & Plan Note (Signed)
There is no change in that

## 2022-03-14 DIAGNOSIS — I2510 Atherosclerotic heart disease of native coronary artery without angina pectoris: Secondary | ICD-10-CM | POA: Diagnosis not present

## 2022-03-14 DIAGNOSIS — E782 Mixed hyperlipidemia: Secondary | ICD-10-CM | POA: Diagnosis not present

## 2022-03-14 DIAGNOSIS — I340 Nonrheumatic mitral (valve) insufficiency: Secondary | ICD-10-CM | POA: Diagnosis not present

## 2022-03-14 DIAGNOSIS — I10 Essential (primary) hypertension: Secondary | ICD-10-CM | POA: Diagnosis not present

## 2022-03-14 DIAGNOSIS — I6389 Other cerebral infarction: Secondary | ICD-10-CM | POA: Diagnosis not present

## 2022-03-14 DIAGNOSIS — R0602 Shortness of breath: Secondary | ICD-10-CM | POA: Diagnosis not present

## 2022-03-14 DIAGNOSIS — I4891 Unspecified atrial fibrillation: Secondary | ICD-10-CM | POA: Diagnosis not present

## 2022-03-21 DIAGNOSIS — I6389 Other cerebral infarction: Secondary | ICD-10-CM | POA: Diagnosis not present

## 2022-03-23 DIAGNOSIS — I340 Nonrheumatic mitral (valve) insufficiency: Secondary | ICD-10-CM | POA: Diagnosis not present

## 2022-03-23 DIAGNOSIS — I4891 Unspecified atrial fibrillation: Secondary | ICD-10-CM | POA: Diagnosis not present

## 2022-03-23 DIAGNOSIS — I6389 Other cerebral infarction: Secondary | ICD-10-CM | POA: Diagnosis not present

## 2022-03-23 DIAGNOSIS — E782 Mixed hyperlipidemia: Secondary | ICD-10-CM | POA: Diagnosis not present

## 2022-03-23 DIAGNOSIS — I2510 Atherosclerotic heart disease of native coronary artery without angina pectoris: Secondary | ICD-10-CM | POA: Diagnosis not present

## 2022-03-23 DIAGNOSIS — I10 Essential (primary) hypertension: Secondary | ICD-10-CM | POA: Diagnosis not present

## 2022-04-10 ENCOUNTER — Encounter: Payer: Self-pay | Admitting: Internal Medicine

## 2022-04-10 ENCOUNTER — Ambulatory Visit (INDEPENDENT_AMBULATORY_CARE_PROVIDER_SITE_OTHER): Payer: Medicare Other | Admitting: Internal Medicine

## 2022-04-10 VITALS — BP 129/71 | HR 66 | Ht 67.0 in | Wt 169.0 lb

## 2022-04-10 DIAGNOSIS — Z9989 Dependence on other enabling machines and devices: Secondary | ICD-10-CM | POA: Diagnosis not present

## 2022-04-10 DIAGNOSIS — I10 Essential (primary) hypertension: Secondary | ICD-10-CM

## 2022-04-10 DIAGNOSIS — G4733 Obstructive sleep apnea (adult) (pediatric): Secondary | ICD-10-CM | POA: Diagnosis not present

## 2022-04-10 DIAGNOSIS — I4891 Unspecified atrial fibrillation: Secondary | ICD-10-CM | POA: Diagnosis not present

## 2022-04-10 DIAGNOSIS — G250 Essential tremor: Secondary | ICD-10-CM | POA: Diagnosis not present

## 2022-04-10 LAB — POCT INR: INR: 2.2 (ref 2.0–3.0)

## 2022-04-10 NOTE — Assessment & Plan Note (Signed)
stable °

## 2022-04-10 NOTE — Progress Notes (Signed)
Established Patient Office Visit  Subjective:  Patient ID: Kyle Santos, male    DOB: 10/30/1935  Age: 86 y.o. MRN: [id number]  CC:  Chief Complaint  Patient presents with   Atrial Fibrillation    Atrial Fibrillation Past medical history includes atrial fibrillation.    Kyle Santos presents for headache comes and goes  Past Medical History:  Diagnosis Date   A-fib [facility])    Arthritis    BPH (benign prostatic hyperplasia)    Depression    Heart murmur    HOH (hard of hearing)    Hypercholesteremia    Hypertension    Loss of equilibrium    being evaluated at this time.   Pneumonia    history of   Sleep apnea     Past Surgical History:  Procedure Laterality Date   CATARACT EXTRACTION W/PHACO Right 01/09/2017   Procedure: CATARACT EXTRACTION PHACO AND INTRAOCULAR LENS PLACEMENT (IOC);  Surgeon: Birder Robson, MD;  Location: [facility] ORS;  Service: Ophthalmology;  Laterality: Right;  [place] 01:06 AP% 19.7 CDE 13.18 Fluid pack lot # [id number]   CATARACT EXTRACTION W/PHACO Left 08/07/2017   Procedure: CATARACT EXTRACTION PHACO AND INTRAOCULAR LENS PLACEMENT (IOC)-LEFT;  Surgeon: Birder Robson, MD;  Location: [facility] ORS;  Service: Ophthalmology;  Laterality: Left;  [place]  00:43 AP% 15.0 CDE 6.50 Fluid pack lot # [id number]   COLONOSCOPY     JOINT REPLACEMENT     knee   JOINT REPLACEMENT     shoulder    Family History  Problem Relation Age of Onset   Heart disease Mother    Heart disease Father     Social History   Socioeconomic History   Marital status: Married    Spouse name: [name]   Number of children: 3   Years of education: College Degree   Highest education level: Bachelor's degree (e.g., BA, AB, BS)  Occupational History   Occupation: Retired  Tobacco Use   Smoking status: Former    Packs/day: 0.50    Years: 7.00    Total pack years: 3.50    Types: Cigarettes    Quit date: 08/22/1963    Years since quitting: 58.6   Smokeless tobacco: Never   Vaping Use   Vaping Use: Never used  Substance and Sexual Activity   Alcohol use: Yes    Alcohol/week: 0.0 standard drinks of alcohol    Comment: occasional   Drug use: No   Sexual activity: Not Currently    Birth control/protection: None  Other Topics Concern   Not on file  Social History Narrative   Drinks about 2 cups of caffeine.   Social Determinants of Health   Financial Resource Strain: Medium Risk (05/13/2021)   Overall Financial Resource Strain (CARDIA)    Difficulty of Paying Living Expenses: Somewhat hard  Food Insecurity: No Food Insecurity (05/13/2021)   Hunger Vital Sign    Worried About Running Out of Food in the Last Year: Never true    Ran Out of Food in the Last Year: Never true  Transportation Needs: No Transportation Needs (05/13/2021)   PRAPARE - Hydrologist (Medical): No    Lack of Transportation (Non-Medical): No  Physical Activity: Sufficiently Active (05/13/2021)   Exercise Vital Sign    Days of Exercise per Week: 3 days    Minutes of Exercise per Session: 150+ min  Stress: Not on file  Social Connections: Socially Integrated (05/13/2021)   Social Connection and Isolation Panel [  NHANES]    Frequency of Communication with Friends and Family: More than three times a week    Frequency of Social Gatherings with Friends and Family: Once a week    Attends Religious Services: More than 4 times per year    Active Member of [employer] or Organizations: Yes    Attends Music therapist: More than 4 times per year    Marital Status: Married  Human resources officer Violence: Not At Risk (05/13/2021)   Humiliation, Afraid, Rape, and Kick questionnaire    Fear of Current or Ex-Partner: No    Emotionally Abused: No    Physically Abused: No    Sexually Abused: No     Current Outpatient Medications:    atorvastatin (LIPITOR) 80 MG tablet, Take 25 mg by mouth daily., Disp: , Rfl:    B Complex Vitamins (B COMPLEX-B12 PO), Take 1  tablet by mouth daily., Disp: , Rfl:    Cascara [place] 450 MG CAPS, Take 450 mg by mouth 2 (two) times daily., Disp: , Rfl:    Cholecalciferol (VITAMIN D3) 1000 units CAPS, Take 1,000 Units by mouth daily at 3 pm., Disp: , Rfl:    losartan (COZAAR) 25 MG tablet, Take 25 mg by mouth daily., Disp: , Rfl:    Melatonin 5 MG TABS, Take 5 mg by mouth at bedtime. , Disp: , Rfl:    naproxen sodium (ALEVE) 220 MG tablet, Take 220 mg by mouth daily as needed., Disp: , Rfl:    tamsulosin (FLOMAX) 0.4 MG CAPS capsule, Take 1 capsule (0.4 mg total) by mouth daily., Disp: 90 capsule, Rfl: 3   traZODone (DESYREL) 50 MG tablet, Take 25 mg by mouth at bedtime., Disp: , Rfl:    warfarin (COUMADIN) 7.5 MG tablet, Take 1 tablet (7.5 mg total) by mouth daily., Disp: 90 tablet, Rfl: 3   No Known Allergies  ROS Review of Systems  Constitutional: Negative.   HENT: Negative.    Eyes: Negative.   Respiratory: Negative.    Cardiovascular: Negative.   Gastrointestinal: Negative.   Endocrine: Negative.   Genitourinary: Negative.   Musculoskeletal: Negative.   Skin: Negative.   Allergic/Immunologic: Negative.   Neurological: Negative.   Hematological: Negative.   Psychiatric/Behavioral: Negative.    All other systems reviewed and are negative.     Objective:    Physical Exam Vitals reviewed.  Constitutional:      Appearance: Normal appearance.  HENT:     Mouth/Throat:     Mouth: Mucous membranes are moist.  Eyes:     Pupils: Pupils are equal, round, and reactive to light.  Neck:     Vascular: No carotid bruit.  Cardiovascular:     Rate and Rhythm: Normal rate and regular rhythm.     Pulses: Normal pulses.     Heart sounds: Normal heart sounds.  Pulmonary:     Effort: Pulmonary effort is normal.     Breath sounds: Normal breath sounds.  Abdominal:     General: Bowel sounds are normal.     Palpations: Abdomen is soft. There is no hepatomegaly, splenomegaly or mass.     Tenderness: There is no  abdominal tenderness.     Hernia: No hernia is present.  Musculoskeletal:     Cervical back: Neck supple.     Right lower leg: No edema.     Left lower leg: No edema.  Skin:    Findings: No rash.  Neurological:     Mental Status: He is alert  and oriented to person, place, and time.     Motor: No weakness.  Psychiatric:        Mood and Affect: Mood normal.        Behavior: Behavior normal.     BP 129/71   Pulse 66   Ht 5' 7"  (1.702 m)   Wt 169 lb (76.7 kg)   BMI 26.47 kg/m  Wt Readings from Last 3 Encounters:  04/10/22 169 lb (76.7 kg)  03/08/22 169 lb 1.6 oz (76.7 kg)  02/06/22 166 lb 11.2 oz (75.6 kg)     Health Maintenance Due  Topic Date Due   Zoster Vaccines- Shingrix (1 of 2) Never done   COVID-19 Vaccine (5 - Moderna series) 02/24/2021   INFLUENZA VACCINE  03/21/2022    There are no preventive care reminders to display for this patient.  Lab Results  Component Value Date   TSH 1.77 08/25/2020   Lab Results  Component Value Date   WBC 5.4 11/01/2021   HGB 13.7 11/01/2021   HCT 41.3 11/01/2021   MCV 93.2 11/01/2021   PLT 163 11/01/2021   Lab Results  Component Value Date   NA 141 11/01/2021   K 4.4 11/01/2021   CO2 23 11/01/2021   GLUCOSE 99 11/01/2021   BUN 26 (H) 11/01/2021   CREATININE 1.19 11/01/2021   BILITOT 1.0 11/01/2021   AST 26 11/01/2021   ALT 26 11/01/2021   PROT 7.1 11/01/2021   CALCIUM 9.2 11/01/2021   ANIONGAP 9 07/02/2019   EGFR 60 11/01/2021   No results found for: "CHOL" No results found for: "HDL" No results found for: "LDLCALC" No results found for: "TRIG" No results found for: "CHOLHDL" Lab Results  Component Value Date   HGBA1C 6.5 (H) 08/25/2020      Assessment & Plan:   Problem List Items Addressed This Visit       Cardiovascular and Mediastinum   Atrial fibrillation ([place]) - Primary    stable      Relevant Orders   POCT INR (Completed)   Essential hypertension    stable        Respiratory   OSA  on CPAP    dont use cpap        Nervous and Auditory   Tremor, essential    stable       No orders of the defined types were placed in this encounter.   Follow-up: No follow-ups on file.    Cletis Athens, MD

## 2022-04-10 NOTE — Assessment & Plan Note (Signed)
dont use cpap

## 2022-04-20 DIAGNOSIS — I4891 Unspecified atrial fibrillation: Secondary | ICD-10-CM | POA: Diagnosis not present

## 2022-04-20 DIAGNOSIS — I340 Nonrheumatic mitral (valve) insufficiency: Secondary | ICD-10-CM | POA: Diagnosis not present

## 2022-04-20 DIAGNOSIS — I10 Essential (primary) hypertension: Secondary | ICD-10-CM | POA: Diagnosis not present

## 2022-04-20 DIAGNOSIS — E782 Mixed hyperlipidemia: Secondary | ICD-10-CM | POA: Diagnosis not present

## 2022-04-20 DIAGNOSIS — I2510 Atherosclerotic heart disease of native coronary artery without angina pectoris: Secondary | ICD-10-CM | POA: Diagnosis not present

## 2022-05-15 ENCOUNTER — Encounter: Payer: Self-pay | Admitting: Internal Medicine

## 2022-05-15 ENCOUNTER — Ambulatory Visit (INDEPENDENT_AMBULATORY_CARE_PROVIDER_SITE_OTHER): Payer: Medicare Other | Admitting: Internal Medicine

## 2022-05-15 VITALS — BP 159/82 | HR 61 | Ht 67.0 in | Wt 170.4 lb

## 2022-05-15 DIAGNOSIS — I4811 Longstanding persistent atrial fibrillation: Secondary | ICD-10-CM

## 2022-05-15 DIAGNOSIS — I10 Essential (primary) hypertension: Secondary | ICD-10-CM

## 2022-05-15 DIAGNOSIS — Z23 Encounter for immunization: Secondary | ICD-10-CM

## 2022-05-15 DIAGNOSIS — G20 Parkinson's disease: Secondary | ICD-10-CM | POA: Diagnosis not present

## 2022-05-15 LAB — POCT INR: INR: 2.2 (ref 2.0–3.0)

## 2022-05-15 NOTE — Progress Notes (Signed)
Established Patient Office Visit  Subjective:  Patient ID: Kyle Santos, male    DOB: 12/15/1935  Age: 86 y.o. MRN: [id number]  CC:  Chief Complaint  Patient presents with   Atrial Fibrillation    Atrial Fibrillation Past medical history includes atrial fibrillation.    Kyle Santos presents for check up problems      Past Medical History:  Diagnosis Date   A-fib [facility])    Arthritis    BPH (benign prostatic hyperplasia)    Depression    Heart murmur    HOH (hard of hearing)    Hypercholesteremia    Hypertension    Loss of equilibrium    being evaluated at this time.   Pneumonia    history of   Sleep apnea     Past Surgical History:  Procedure Laterality Date   CATARACT EXTRACTION W/PHACO Right 01/09/2017   Procedure: CATARACT EXTRACTION PHACO AND INTRAOCULAR LENS PLACEMENT (IOC);  Surgeon: Birder Robson, MD;  Location: [facility] ORS;  Service: Ophthalmology;  Laterality: Right;  [place] 01:06 AP% 19.7 CDE 13.18 Fluid pack lot # [id number]   CATARACT EXTRACTION W/PHACO Left 08/07/2017   Procedure: CATARACT EXTRACTION PHACO AND INTRAOCULAR LENS PLACEMENT (IOC)-LEFT;  Surgeon: Birder Robson, MD;  Location: [facility] ORS;  Service: Ophthalmology;  Laterality: Left;  [place]  00:43 AP% 15.0 CDE 6.50 Fluid pack lot # [id number]   COLONOSCOPY     JOINT REPLACEMENT     knee   JOINT REPLACEMENT     shoulder    Family History  Problem Relation Age of Onset   Heart disease Mother    Heart disease Father     Social History   Socioeconomic History   Marital status: Married    Spouse name: [name]   Number of children: 3   Years of education: College Degree   Highest education level: Bachelor's degree (e.g., BA, AB, BS)  Occupational History   Occupation: Retired  Tobacco Use   Smoking status: Former    Packs/day: 0.50    Years: 7.00    Total pack years: 3.50    Types: Cigarettes    Quit date: 08/22/1963    Years since quitting: 58.7   Smokeless tobacco: Never   Vaping Use   Vaping Use: Never used  Substance and Sexual Activity   Alcohol use: Yes    Alcohol/week: 0.0 standard drinks of alcohol    Comment: occasional   Drug use: No   Sexual activity: Not Currently    Birth control/protection: None  Other Topics Concern   Not on file  Social History Narrative   Drinks about 2 cups of caffeine.   Social Determinants of Health   Financial Resource Strain: Medium Risk (05/13/2021)   Overall Financial Resource Strain (CARDIA)    Difficulty of Paying Living Expenses: Somewhat hard  Food Insecurity: No Food Insecurity (05/13/2021)   Hunger Vital Sign    Worried About Running Out of Food in the Last Year: Never true    Ran Out of Food in the Last Year: Never true  Transportation Needs: No Transportation Needs (05/13/2021)   PRAPARE - Hydrologist (Medical): No    Lack of Transportation (Non-Medical): No  Physical Activity: Sufficiently Active (05/13/2021)   Exercise Vital Sign    Days of Exercise per Week: 3 days    Minutes of Exercise per Session: 150+ min  Stress: Not on file  Social Connections: Socially Integrated (05/13/2021)   Social Connection  and Isolation Panel [NHANES]    Frequency of Communication with Friends and Family: More than three times a week    Frequency of Social Gatherings with Friends and Family: Once a week    Attends Religious Services: More than 4 times per year    Active Member of [employer] or Organizations: Yes    Attends Music therapist: More than 4 times per year    Marital Status: Married  Human resources officer Violence: Not At Risk (05/13/2021)   Humiliation, Afraid, Rape, and Kick questionnaire    Fear of Current or Ex-Partner: No    Emotionally Abused: No    Physically Abused: No    Sexually Abused: No     Current Outpatient Medications:    B Complex Vitamins (B COMPLEX-B12 PO), Take 1 tablet by mouth daily., Disp: , Rfl:    Cascara [place] 450 MG CAPS, Take 450 mg by  mouth 2 (two) times daily., Disp: , Rfl:    Cholecalciferol (VITAMIN D3) 1000 units CAPS, Take 1,000 Units by mouth daily at 3 pm., Disp: , Rfl:    losartan (COZAAR) 25 MG tablet, Take 25 mg by mouth daily., Disp: , Rfl:    Melatonin 5 MG TABS, Take 5 mg by mouth at bedtime. , Disp: , Rfl:    naproxen sodium (ALEVE) 220 MG tablet, Take 220 mg by mouth daily as needed., Disp: , Rfl:    tamsulosin (FLOMAX) 0.4 MG CAPS capsule, Take 1 capsule (0.4 mg total) by mouth daily., Disp: 90 capsule, Rfl: 3   traZODone (DESYREL) 50 MG tablet, Take 25 mg by mouth at bedtime., Disp: , Rfl:    warfarin (COUMADIN) 7.5 MG tablet, Take 1 tablet (7.5 mg total) by mouth daily., Disp: 90 tablet, Rfl: 3   No Known Allergies  ROS Review of Systems  Constitutional: Negative.   HENT: Negative.    Eyes: Negative.   Respiratory: Negative.    Cardiovascular: Negative.   Gastrointestinal: Negative.   Endocrine: Negative.   Genitourinary: Negative.   Musculoskeletal: Negative.   Skin: Negative.   Allergic/Immunologic: Negative.   Neurological: Negative.   Hematological: Negative.   Psychiatric/Behavioral: Negative.    All other systems reviewed and are negative.     Objective:    Physical Exam Vitals reviewed.  Constitutional:      Appearance: Normal appearance.  HENT:     Mouth/Throat:     Mouth: Mucous membranes are moist.  Eyes:     Pupils: Pupils are equal, round, and reactive to light.  Neck:     Vascular: No carotid bruit.  Cardiovascular:     Rate and Rhythm: Normal rate and regular rhythm.     Pulses: Normal pulses.     Heart sounds: Normal heart sounds.  Pulmonary:     Effort: Pulmonary effort is normal.     Breath sounds: Normal breath sounds.  Abdominal:     General: Bowel sounds are normal.     Palpations: Abdomen is soft. There is no hepatomegaly, splenomegaly or mass.     Tenderness: There is no abdominal tenderness.     Hernia: No hernia is present.  Musculoskeletal:      Cervical back: Neck supple.     Right lower leg: No edema.     Left lower leg: No edema.  Skin:    Findings: No rash.  Neurological:     Mental Status: He is alert and oriented to person, place, and time.     Motor: No weakness.  Psychiatric:        Mood and Affect: Mood normal.        Behavior: Behavior normal.     BP (!) 159/82   Pulse 61   Ht 5' 7"  (1.702 m)   Wt 170 lb 6.4 oz (77.3 kg)   BMI 26.69 kg/m  Wt Readings from Last 3 Encounters:  05/15/22 170 lb 6.4 oz (77.3 kg)  04/10/22 169 lb (76.7 kg)  03/08/22 169 lb 1.6 oz (76.7 kg)     Health Maintenance Due  Topic Date Due   Zoster Vaccines- Shingrix (1 of 2) Never done   COVID-19 Vaccine (5 - Moderna series) 02/24/2021    There are no preventive care reminders to display for this patient.  Lab Results  Component Value Date   TSH 1.77 08/25/2020   Lab Results  Component Value Date   WBC 5.4 11/01/2021   HGB 13.7 11/01/2021   HCT 41.3 11/01/2021   MCV 93.2 11/01/2021   PLT 163 11/01/2021   Lab Results  Component Value Date   NA 141 11/01/2021   K 4.4 11/01/2021   CO2 23 11/01/2021   GLUCOSE 99 11/01/2021   BUN 26 (H) 11/01/2021   CREATININE 1.19 11/01/2021   BILITOT 1.0 11/01/2021   AST 26 11/01/2021   ALT 26 11/01/2021   PROT 7.1 11/01/2021   CALCIUM 9.2 11/01/2021   ANIONGAP 9 07/02/2019   EGFR 60 11/01/2021   No results found for: "CHOL" No results found for: "HDL" No results found for: "LDLCALC" No results found for: "TRIG" No results found for: "CHOLHDL" Lab Results  Component Value Date   HGBA1C 6.5 (H) 08/25/2020      Assessment & Plan:   Problem List Items Addressed This Visit       Cardiovascular and Mediastinum   Atrial fibrillation ([place]) - Primary    Atrial fibrillation is stable      Relevant Orders   POCT INR (Completed)   Essential hypertension     Patient denies any chest pain or shortness of breath there is no history of palpitation or paroxysmal nocturnal  dyspnea   patient was advised to follow low-salt low-cholesterol diet    ideally I want to keep systolic blood pressure below 130 mmHg, patient was asked to check blood pressure one times a week and give me a report on that.  Patient will be follow-up in 3 months  or earlier as needed, patient will call me back for any change in the cardiovascular symptoms Patient was advised to buy a book from local bookstore concerning blood pressure and read several chapters  every day.  This will be supplemented by some of the material we will give him from the office.  Patient should also utilize other resources like YouTube and Internet to learn more about the blood pressure and the diet.        Nervous and Auditory   Primary parkinsonism ([place])    Stable at the present time        Other   Need for influenza vaccination    Suggest flu shot      Relevant Orders   Flu Vaccine QUAD High Dose(Fluad) (Completed)    No orders of the defined types were placed in this encounter.   Follow-up: No follow-ups on file.    Cletis Athens, MD

## 2022-05-15 NOTE — Assessment & Plan Note (Signed)
Atrial fibrillation is stable

## 2022-05-15 NOTE — Assessment & Plan Note (Signed)
Suggest flu shot

## 2022-05-15 NOTE — Assessment & Plan Note (Signed)

## 2022-05-15 NOTE — Assessment & Plan Note (Signed)
Stable at the present time. 

## 2022-05-16 DIAGNOSIS — I10 Essential (primary) hypertension: Secondary | ICD-10-CM | POA: Diagnosis not present

## 2022-05-16 DIAGNOSIS — I2510 Atherosclerotic heart disease of native coronary artery without angina pectoris: Secondary | ICD-10-CM | POA: Diagnosis not present

## 2022-05-16 DIAGNOSIS — I340 Nonrheumatic mitral (valve) insufficiency: Secondary | ICD-10-CM | POA: Diagnosis not present

## 2022-05-16 DIAGNOSIS — I4891 Unspecified atrial fibrillation: Secondary | ICD-10-CM | POA: Diagnosis not present

## 2022-05-16 DIAGNOSIS — E782 Mixed hyperlipidemia: Secondary | ICD-10-CM | POA: Diagnosis not present

## 2022-05-28 ENCOUNTER — Other Ambulatory Visit: Payer: Self-pay | Admitting: Internal Medicine

## 2022-05-28 DIAGNOSIS — N401 Enlarged prostate with lower urinary tract symptoms: Secondary | ICD-10-CM

## 2022-05-28 DIAGNOSIS — I4891 Unspecified atrial fibrillation: Secondary | ICD-10-CM

## 2022-06-19 ENCOUNTER — Encounter: Payer: Self-pay | Admitting: Internal Medicine

## 2022-06-19 ENCOUNTER — Ambulatory Visit (INDEPENDENT_AMBULATORY_CARE_PROVIDER_SITE_OTHER): Payer: Medicare Other | Admitting: Internal Medicine

## 2022-06-19 VITALS — BP 146/82 | HR 62 | Ht 67.0 in | Wt 163.3 lb

## 2022-06-19 DIAGNOSIS — I10 Essential (primary) hypertension: Secondary | ICD-10-CM | POA: Diagnosis not present

## 2022-06-19 DIAGNOSIS — G4733 Obstructive sleep apnea (adult) (pediatric): Secondary | ICD-10-CM | POA: Diagnosis not present

## 2022-06-19 DIAGNOSIS — N401 Enlarged prostate with lower urinary tract symptoms: Secondary | ICD-10-CM

## 2022-06-19 DIAGNOSIS — I4811 Longstanding persistent atrial fibrillation: Secondary | ICD-10-CM

## 2022-06-19 DIAGNOSIS — G20C Parkinsonism, unspecified: Secondary | ICD-10-CM

## 2022-06-19 LAB — POCT INR: POC INR: 1.8

## 2022-06-19 NOTE — Assessment & Plan Note (Signed)

## 2022-06-19 NOTE — Assessment & Plan Note (Signed)
Patient has not been using CPAP machine for some time

## 2022-06-19 NOTE — Assessment & Plan Note (Signed)
Stable at the present time. 

## 2022-06-19 NOTE — Assessment & Plan Note (Signed)
Stable Patient has a chest cold so we will do a COVID test.  Chest is minimally congested without any rales or rhonchi.  Abdomin is soft nontender

## 2022-06-19 NOTE — Assessment & Plan Note (Signed)
Atrial fibrillation is under control at the present time

## 2022-06-19 NOTE — Progress Notes (Signed)
Established Patient Office Visit  Subjective:  Patient ID: Kyle Santos, male    DOB: 06/05/1936  Age: 86 y.o. MRN: [id number]  CC:  Chief Complaint  Patient presents with   Atrial Fibrillation    Atrial Fibrillation Past medical history includes atrial fibrillation.    Vahan A Randell Patient presents for pro time check  Past Medical History:  Diagnosis Date   A-fib [facility])    Arthritis    BPH (benign prostatic hyperplasia)    Depression    Heart murmur    HOH (hard of hearing)    Hypercholesteremia    Hypertension    Loss of equilibrium    being evaluated at this time.   Pneumonia    history of   Sleep apnea     Past Surgical History:  Procedure Laterality Date   CATARACT EXTRACTION W/PHACO Right 01/09/2017   Procedure: CATARACT EXTRACTION PHACO AND INTRAOCULAR LENS PLACEMENT (IOC);  Surgeon: Birder Robson, MD;  Location: [facility] ORS;  Service: Ophthalmology;  Laterality: Right;  [place] 01:06 AP% 19.7 CDE 13.18 Fluid pack lot # [id number]   CATARACT EXTRACTION W/PHACO Left 08/07/2017   Procedure: CATARACT EXTRACTION PHACO AND INTRAOCULAR LENS PLACEMENT (IOC)-LEFT;  Surgeon: Birder Robson, MD;  Location: [facility] ORS;  Service: Ophthalmology;  Laterality: Left;  [place]  00:43 AP% 15.0 CDE 6.50 Fluid pack lot # [id number]   COLONOSCOPY     JOINT REPLACEMENT     knee   JOINT REPLACEMENT     shoulder    Family History  Problem Relation Age of Onset   Heart disease Mother    Heart disease Father     Social History   Socioeconomic History   Marital status: Married    Spouse name: [name]   Number of children: 3   Years of education: College Degree   Highest education level: Bachelor's degree (e.g., BA, AB, BS)  Occupational History   Occupation: Retired  Tobacco Use   Smoking status: Former    Packs/day: 0.50    Years: 7.00    Total pack years: 3.50    Types: Cigarettes    Quit date: 08/22/1963    Years since quitting: 58.8   Smokeless tobacco: Never  Vaping Use    Vaping Use: Never used  Substance and Sexual Activity   Alcohol use: Yes    Alcohol/week: 0.0 standard drinks of alcohol    Comment: occasional   Drug use: No   Sexual activity: Not Currently    Birth control/protection: None  Other Topics Concern   Not on file  Social History Narrative   Drinks about 2 cups of caffeine.   Social Determinants of Health   Financial Resource Strain: Medium Risk (05/13/2021)   Overall Financial Resource Strain (CARDIA)    Difficulty of Paying Living Expenses: Somewhat hard  Food Insecurity: No Food Insecurity (05/13/2021)   Hunger Vital Sign    Worried About Running Out of Food in the Last Year: Never true    Ran Out of Food in the Last Year: Never true  Transportation Needs: No Transportation Needs (05/13/2021)   PRAPARE - Hydrologist (Medical): No    Lack of Transportation (Non-Medical): No  Physical Activity: Sufficiently Active (05/13/2021)   Exercise Vital Sign    Days of Exercise per Week: 3 days    Minutes of Exercise per Session: 150+ min  Stress: Not on file  Social Connections: Socially Integrated (05/13/2021)   Social Connection and Isolation Panel [NHANES]  Frequency of Communication with Friends and Family: More than three times a week    Frequency of Social Gatherings with Friends and Family: Once a week    Attends Religious Services: More than 4 times per year    Active Member of [employer] or Organizations: Yes    Attends Music therapist: More than 4 times per year    Marital Status: Married  Human resources officer Violence: Not At Risk (05/13/2021)   Humiliation, Afraid, Rape, and Kick questionnaire    Fear of Current or Ex-Partner: No    Emotionally Abused: No    Physically Abused: No    Sexually Abused: No     Current Outpatient Medications:    B Complex Vitamins (B COMPLEX-B12 PO), Take 1 tablet by mouth daily., Disp: , Rfl:    Cascara [place] 450 MG CAPS, Take 450 mg by mouth 2 (two)  times daily., Disp: , Rfl:    Cholecalciferol (VITAMIN D3) 1000 units CAPS, Take 1,000 Units by mouth daily at 3 pm., Disp: , Rfl:    Melatonin 5 MG TABS, Take 5 mg by mouth at bedtime. , Disp: , Rfl:    naproxen sodium (ALEVE) 220 MG tablet, Take 220 mg by mouth daily as needed., Disp: , Rfl:    tamsulosin (FLOMAX) 0.4 MG CAPS capsule, TAKE 1 CAPSULE BY MOUTH DAILY, Disp: 90 capsule, Rfl: 3   traZODone (DESYREL) 50 MG tablet, Take 25 mg by mouth at bedtime., Disp: , Rfl:    warfarin (COUMADIN) 7.5 MG tablet, TAKE 1 TABLET BY MOUTH DAILY, Disp: 90 tablet, Rfl: 3   No Known Allergies  ROS Review of Systems  Constitutional: Negative.   HENT: Negative.    Eyes: Negative.   Respiratory: Negative.    Cardiovascular: Negative.   Gastrointestinal: Negative.   Endocrine: Negative.   Genitourinary: Negative.   Musculoskeletal: Negative.   Skin: Negative.   Allergic/Immunologic: Negative.   Neurological: Negative.   Hematological: Negative.   Psychiatric/Behavioral: Negative.    All other systems reviewed and are negative.     Objective:    Physical Exam Vitals reviewed.  Constitutional:      Appearance: Normal appearance.  HENT:     Mouth/Throat:     Mouth: Mucous membranes are moist.  Eyes:     Pupils: Pupils are equal, round, and reactive to light.  Neck:     Vascular: No carotid bruit.  Cardiovascular:     Rate and Rhythm: Normal rate and regular rhythm.     Pulses: Normal pulses.     Heart sounds: Normal heart sounds.  Pulmonary:     Effort: Pulmonary effort is normal.     Breath sounds: Normal breath sounds.  Abdominal:     General: Bowel sounds are normal.     Palpations: Abdomen is soft. There is no hepatomegaly, splenomegaly or mass.     Tenderness: There is no abdominal tenderness.     Hernia: No hernia is present.  Musculoskeletal:     Cervical back: Neck supple.     Right lower leg: No edema.     Left lower leg: No edema.  Skin:    Findings: No rash.   Neurological:     Mental Status: He is alert and oriented to person, place, and time.     Motor: No weakness.  Psychiatric:        Mood and Affect: Mood normal.        Behavior: Behavior normal.     BP Marland Kitchen)  146/82   Pulse 62   Ht _0  (1.702 m)   Wt 163 lb 4.8 oz (74.1 kg)   BMI 25.58 kg/m  Wt Readings from Last 3 Encounters:  06/19/22 163 lb 4.8 oz (74.1 kg)  05/15/22 170 lb 6.4 oz (77.3 kg)  04/10/22 169 lb (76.7 kg)     Health Maintenance Due  Topic Date Due   Zoster Vaccines- Shingrix (1 of 2) Never done   COVID-19 Vaccine (5 - Moderna series) 02/24/2021   Medicare Annual Wellness (AWV)  05/13/2022    There are no preventive care reminders to display for this patient.  Lab Results  Component Value Date   TSH 1.77 08/25/2020   Lab Results  Component Value Date   WBC 5.4 11/01/2021   HGB 13.7 11/01/2021   HCT 41.3 11/01/2021   MCV 93.2 11/01/2021   PLT 163 11/01/2021   Lab Results  Component Value Date   NA 141 11/01/2021   K 4.4 11/01/2021   CO2 23 11/01/2021   GLUCOSE 99 11/01/2021   BUN 26 (H) 11/01/2021   CREATININE 1.19 11/01/2021   BILITOT 1.0 11/01/2021   AST 26 11/01/2021   ALT 26 11/01/2021   PROT 7.1 11/01/2021   CALCIUM 9.2 11/01/2021   ANIONGAP 9 07/02/2019   EGFR 60 11/01/2021   No results found for: "CHOL" No results found for: "HDL" No results found for: "LDLCALC" No results found for: "TRIG" No results found for: "CHOLHDL" Lab Results  Component Value Date   HGBA1C 6.5 (H) 08/25/2020      Assessment & Plan:   Problem List Items Addressed This Visit       Cardiovascular and Mediastinum   Atrial fibrillation ([place]) - Primary    Atrial fibrillation is under control at the present time      Relevant Orders   POCT INR (Completed)   Essential hypertension     Patient denies any chest pain or shortness of breath there is no history of palpitation or paroxysmal nocturnal dyspnea   patient was advised to follow low-salt  low-cholesterol diet    ideally I want to keep systolic blood pressure below 130 mmHg, patient was asked to check blood pressure one times a week and give me a report on that.  Patient will be follow-up in 3 months  or earlier as needed, patient will call me back for any change in the cardiovascular symptoms Patient was advised to buy a book from local bookstore concerning blood pressure and read several chapters  every day.  This will be supplemented by some of the material we will give him from the office.  Patient should also utilize other resources like YouTube and Internet to learn more about the blood pressure and the diet.        Respiratory   OSA on CPAP    Patient has not been using CPAP machine for some time        Nervous and Auditory   Primary parkinsonism    Stable at the present time.        Genitourinary   Benign localized prostatic hyperplasia with lower urinary tract symptoms (LUTS)    Stable Patient has a chest cold so we will do a COVID test.  Chest is minimally congested without any rales or rhonchi.  Abdomin is soft nontender       No orders of the defined types were placed in this encounter.   Follow-up: No follow-ups on file.  Cletis Athens, MD

## 2022-07-24 ENCOUNTER — Encounter: Payer: Self-pay | Admitting: Internal Medicine

## 2022-07-24 ENCOUNTER — Ambulatory Visit (INDEPENDENT_AMBULATORY_CARE_PROVIDER_SITE_OTHER): Payer: Medicare Other | Admitting: Internal Medicine

## 2022-07-24 ENCOUNTER — Ambulatory Visit: Payer: Medicare Other | Admitting: Internal Medicine

## 2022-07-24 VITALS — BP 163/87 | HR 58 | Temp 97.6°F | Ht 66.0 in | Wt 168.4 lb

## 2022-07-24 DIAGNOSIS — G629 Polyneuropathy, unspecified: Secondary | ICD-10-CM | POA: Diagnosis not present

## 2022-07-24 DIAGNOSIS — I10 Essential (primary) hypertension: Secondary | ICD-10-CM | POA: Diagnosis not present

## 2022-07-24 DIAGNOSIS — M519 Unspecified thoracic, thoracolumbar and lumbosacral intervertebral disc disorder: Secondary | ICD-10-CM

## 2022-07-24 DIAGNOSIS — G250 Essential tremor: Secondary | ICD-10-CM | POA: Diagnosis not present

## 2022-07-24 DIAGNOSIS — I4811 Longstanding persistent atrial fibrillation: Secondary | ICD-10-CM

## 2022-07-24 LAB — POCT INR: INR: 1.7 — AB (ref 2.0–3.0)

## 2022-07-24 MED ORDER — GABAPENTIN 100 MG PO CAPS: 100.0000 mg | ORAL_CAPSULE | Freq: Three times a day (TID) | ORAL | 3 refills | Status: DC

## 2022-07-24 NOTE — Assessment & Plan Note (Signed)

## 2022-07-24 NOTE — Assessment & Plan Note (Signed)
His atrial fibrillation is under control

## 2022-07-24 NOTE — Assessment & Plan Note (Signed)
Will start gabapentin for neuropathy.

## 2022-07-24 NOTE — Assessment & Plan Note (Signed)
Back- Patient's back pain is under control with medication.  - Encouraged the patient to stretch or do yoga as able to help with back pain

## 2022-07-24 NOTE — Progress Notes (Signed)
Established Patient Office Visit  Subjective:  Patient ID: Kyle Santos, male    DOB: 1935-08-30  Age: 86 y.o. MRN: [id number]  CC:  Chief Complaint  Patient presents with   Atrial Fibrillation    Atrial Fibrillation Past medical history includes atrial fibrillation.    [name] presents for pain in lt leg  Past Medical History:  Diagnosis Date   A-fib [facility])    Arthritis    BPH (benign prostatic hyperplasia)    Depression    Heart murmur    HOH (hard of hearing)    Hypercholesteremia    Hypertension    Loss of equilibrium    being evaluated at this time.   Pneumonia    history of   Sleep apnea     Past Surgical History:  Procedure Laterality Date   CATARACT EXTRACTION W/PHACO Right 01/09/2017   Procedure: CATARACT EXTRACTION PHACO AND INTRAOCULAR LENS PLACEMENT (IOC);  Surgeon: Birder Robson, MD;  Location: [facility] ORS;  Service: Ophthalmology;  Laterality: Right;  [place] 01:06 AP% 19.7 CDE 13.18 Fluid pack lot # [id number]   CATARACT EXTRACTION W/PHACO Left 08/07/2017   Procedure: CATARACT EXTRACTION PHACO AND INTRAOCULAR LENS PLACEMENT (IOC)-LEFT;  Surgeon: Birder Robson, MD;  Location: [facility] ORS;  Service: Ophthalmology;  Laterality: Left;  [place]  00:43 AP% 15.0 CDE 6.50 Fluid pack lot # [id number]   COLONOSCOPY     JOINT REPLACEMENT     knee   JOINT REPLACEMENT     shoulder    Family History  Problem Relation Age of Onset   Heart disease Mother    Heart disease Father     Social History   Socioeconomic History   Marital status: Married    Spouse name: [name]   Number of children: 3   Years of education: College Degree   Highest education level: Bachelor's degree (e.g., BA, AB, BS)  Occupational History   Occupation: Retired  Tobacco Use   Smoking status: Former    Packs/day: 0.50    Years: 7.00    Total pack years: 3.50    Types: Cigarettes    Quit date: 08/22/1963    Years since quitting: 58.9   Smokeless tobacco: Never  Vaping Use    Vaping Use: Never used  Substance and Sexual Activity   Alcohol use: Yes    Alcohol/week: 0.0 standard drinks of alcohol    Comment: occasional   Drug use: No   Sexual activity: Not Currently    Birth control/protection: None  Other Topics Concern   Not on file  Social History Narrative   Drinks about 2 cups of caffeine.   Social Determinants of Health   Financial Resource Strain: Medium Risk (05/13/2021)   Overall Financial Resource Strain (CARDIA)    Difficulty of Paying Living Expenses: Somewhat hard  Food Insecurity: No Food Insecurity (05/13/2021)   Hunger Vital Sign    Worried About Running Out of Food in the Last Year: Never true    Ran Out of Food in the Last Year: Never true  Transportation Needs: No Transportation Needs (05/13/2021)   PRAPARE - Hydrologist (Medical): No    Lack of Transportation (Non-Medical): No  Physical Activity: Sufficiently Active (05/13/2021)   Exercise Vital Sign    Days of Exercise per Week: 3 days    Minutes of Exercise per Session: 150+ min  Stress: Not on file  Social Connections: Socially Integrated (05/13/2021)   Social Connection and Isolation Panel [  NHANES]    Frequency of Communication with Friends and Family: More than three times a week    Frequency of Social Gatherings with Friends and Family: Once a week    Attends Religious Services: More than 4 times per year    Active Member of [employer] or Organizations: Yes    Attends Music therapist: More than 4 times per year    Marital Status: Married  Human resources officer Violence: Not At Risk (05/13/2021)   Humiliation, Afraid, Rape, and Kick questionnaire    Fear of Current or Ex-Partner: No    Emotionally Abused: No    Physically Abused: No    Sexually Abused: No     Current Outpatient Medications:    B Complex Vitamins (B COMPLEX-B12 PO), Take 1 tablet by mouth daily., Disp: , Rfl:    Cascara [place]rada 450 MG CAPS, Take 450 mg by mouth 2 (two)  times daily., Disp: , Rfl:    Cholecalciferol (VITAMIN D3) 1000 units CAPS, Take 1,000 Units by mouth daily at 3 pm., Disp: , Rfl:    Melatonin 5 MG TABS, Take 5 mg by mouth at bedtime. , Disp: , Rfl:    naproxen sodium (ALEVE) 220 MG tablet, Take 220 mg by mouth daily as needed., Disp: , Rfl:    tamsulosin (FLOMAX) 0.4 MG CAPS capsule, TAKE 1 CAPSULE BY MOUTH DAILY, Disp: 90 capsule, Rfl: 3   traZODone (DESYREL) 50 MG tablet, Take 25 mg by mouth at bedtime., Disp: , Rfl:    warfarin (COUMADIN) 7.5 MG tablet, TAKE 1 TABLET BY MOUTH DAILY, Disp: 90 tablet, Rfl: 3   No Known Allergies  ROS Review of Systems  Constitutional: Negative.   HENT: Negative.    Eyes: Negative.   Respiratory: Negative.    Cardiovascular: Negative.   Gastrointestinal: Negative.   Endocrine: Negative.   Genitourinary: Negative.   Musculoskeletal: Negative.   Skin: Negative.   Allergic/Immunologic: Negative.   Neurological: Negative.   Hematological: Negative.   Psychiatric/Behavioral: Negative.    All other systems reviewed and are negative.     Objective:    Physical Exam Vitals reviewed.  Constitutional:      Appearance: Normal appearance.  HENT:     Mouth/Throat:     Mouth: Mucous membranes are moist.  Eyes:     Pupils: Pupils are equal, round, and reactive to light.  Neck:     Vascular: No carotid bruit.  Cardiovascular:     Rate and Rhythm: Normal rate and regular rhythm.     Pulses: Normal pulses.     Heart sounds: Normal heart sounds.  Pulmonary:     Effort: Pulmonary effort is normal.     Breath sounds: Normal breath sounds.  Abdominal:     General: Bowel sounds are normal.     Palpations: Abdomen is soft. There is no hepatomegaly, splenomegaly or mass.     Tenderness: There is no abdominal tenderness.     Hernia: No hernia is present.  Musculoskeletal:     Cervical back: Neck supple.     Right lower leg: No edema.     Left lower leg: No edema.  Skin:    Findings: No rash.   Neurological:     Mental Status: He is alert and oriented to person, place, and time.     Motor: No weakness.  Psychiatric:        Mood and Affect: Mood normal.        Behavior: Behavior normal.  BP (!) 163/87   Pulse (!) 58   Temp 97.6 F (36.4 C) (Temporal)   Ht _0  (1.676 m)   Wt 168 lb 6.4 oz (76.4 kg)   SpO2 96%   BMI 27.18 kg/m  Wt Readings from Last 3 Encounters:  07/24/22 168 lb 6.4 oz (76.4 kg)  06/19/22 163 lb 4.8 oz (74.1 kg)  05/15/22 170 lb 6.4 oz (77.3 kg)     Health Maintenance Due  Topic Date Due   Medicare Annual Wellness (AWV)  05/13/2022    There are no preventive care reminders to display for this patient.  Lab Results  Component Value Date   TSH 1.77 08/25/2020   Lab Results  Component Value Date   WBC 5.4 11/01/2021   HGB 13.7 11/01/2021   HCT 41.3 11/01/2021   MCV 93.2 11/01/2021   PLT 163 11/01/2021   Lab Results  Component Value Date   NA 141 11/01/2021   K 4.4 11/01/2021   CO2 23 11/01/2021   GLUCOSE 99 11/01/2021   BUN 26 (H) 11/01/2021   CREATININE 1.19 11/01/2021   BILITOT 1.0 11/01/2021   AST 26 11/01/2021   ALT 26 11/01/2021   PROT 7.1 11/01/2021   CALCIUM 9.2 11/01/2021   ANIONGAP 9 07/02/2019   EGFR 60 11/01/2021   No results found for: "CHOL" No results found for: "HDL" No results found for: "LDLCALC" No results found for: "TRIG" No results found for: "CHOLHDL" Lab Results  Component Value Date   HGBA1C 6.5 (H) 08/25/2020      Assessment & Plan:   Problem List Items Addressed This Visit       Cardiovascular and Mediastinum   Atrial fibrillation ([place]) - Primary    His atrial fibrillation is under control      Relevant Orders   POCT INR (Completed)   Essential hypertension     Patient denies any chest pain or shortness of breath there is no history of palpitation or paroxysmal nocturnal dyspnea   patient was advised to follow low-salt low-cholesterol diet    ideally I want to keep systolic  blood pressure below 130 mmHg, patient was asked to check blood pressure one times a week and give me a report on that.  Patient will be follow-up in 3 months  or earlier as needed, patient will call me back for any change in the cardiovascular symptoms Patient was advised to buy a book from local bookstore concerning blood pressure and read several chapters  every day.  This will be supplemented by some of the material we will give him from the office.  Patient should also utilize other resources like YouTube and Internet to learn more about the blood pressure and the diet.        Nervous and Auditory   Tremor, essential   Neuropathy    Will start gabapentin for neuropathy.        Musculoskeletal and Integument   Lumbosacral disc disease    Back- Patient's back pain is under control with medication.  - Encouraged the patient to stretch or do yoga as able to help with back pain       No orders of the defined types were placed in this encounter.   Follow-up: No follow-ups on file.    Cletis Athens, MD

## 2022-08-17 DIAGNOSIS — I4891 Unspecified atrial fibrillation: Secondary | ICD-10-CM | POA: Diagnosis not present

## 2022-08-17 DIAGNOSIS — I6389 Other cerebral infarction: Secondary | ICD-10-CM | POA: Diagnosis not present

## 2022-08-17 DIAGNOSIS — I2510 Atherosclerotic heart disease of native coronary artery without angina pectoris: Secondary | ICD-10-CM | POA: Diagnosis not present

## 2022-08-17 DIAGNOSIS — I340 Nonrheumatic mitral (valve) insufficiency: Secondary | ICD-10-CM | POA: Diagnosis not present

## 2022-08-17 DIAGNOSIS — E782 Mixed hyperlipidemia: Secondary | ICD-10-CM | POA: Diagnosis not present

## 2022-08-17 DIAGNOSIS — I10 Essential (primary) hypertension: Secondary | ICD-10-CM | POA: Diagnosis not present

## 2022-08-17 DIAGNOSIS — R0602 Shortness of breath: Secondary | ICD-10-CM | POA: Diagnosis not present

## 2022-08-28 ENCOUNTER — Ambulatory Visit: Payer: Medicare HMO | Admitting: Internal Medicine

## 2022-08-28 DIAGNOSIS — I2510 Atherosclerotic heart disease of native coronary artery without angina pectoris: Secondary | ICD-10-CM | POA: Diagnosis not present

## 2022-08-31 DIAGNOSIS — I340 Nonrheumatic mitral (valve) insufficiency: Secondary | ICD-10-CM | POA: Diagnosis not present

## 2022-09-04 DIAGNOSIS — I351 Nonrheumatic aortic (valve) insufficiency: Secondary | ICD-10-CM | POA: Diagnosis not present

## 2022-09-04 DIAGNOSIS — I2510 Atherosclerotic heart disease of native coronary artery without angina pectoris: Secondary | ICD-10-CM | POA: Diagnosis not present

## 2022-09-04 DIAGNOSIS — I340 Nonrheumatic mitral (valve) insufficiency: Secondary | ICD-10-CM | POA: Diagnosis not present

## 2022-09-04 DIAGNOSIS — E782 Mixed hyperlipidemia: Secondary | ICD-10-CM | POA: Diagnosis not present

## 2022-09-04 DIAGNOSIS — I10 Essential (primary) hypertension: Secondary | ICD-10-CM | POA: Diagnosis not present

## 2022-09-04 DIAGNOSIS — I4891 Unspecified atrial fibrillation: Secondary | ICD-10-CM | POA: Diagnosis not present

## 2022-09-15 DIAGNOSIS — E782 Mixed hyperlipidemia: Secondary | ICD-10-CM | POA: Diagnosis not present

## 2022-09-15 DIAGNOSIS — F5101 Primary insomnia: Secondary | ICD-10-CM | POA: Diagnosis not present

## 2022-09-15 DIAGNOSIS — N401 Enlarged prostate with lower urinary tract symptoms: Secondary | ICD-10-CM | POA: Diagnosis not present

## 2022-09-15 DIAGNOSIS — I10 Essential (primary) hypertension: Secondary | ICD-10-CM | POA: Diagnosis not present

## 2022-09-18 DIAGNOSIS — I10 Essential (primary) hypertension: Secondary | ICD-10-CM | POA: Diagnosis not present

## 2022-09-18 DIAGNOSIS — I340 Nonrheumatic mitral (valve) insufficiency: Secondary | ICD-10-CM | POA: Diagnosis not present

## 2022-09-18 DIAGNOSIS — E782 Mixed hyperlipidemia: Secondary | ICD-10-CM | POA: Diagnosis not present

## 2022-09-18 DIAGNOSIS — I2510 Atherosclerotic heart disease of native coronary artery without angina pectoris: Secondary | ICD-10-CM | POA: Diagnosis not present

## 2022-09-18 DIAGNOSIS — I6389 Other cerebral infarction: Secondary | ICD-10-CM | POA: Diagnosis not present

## 2022-09-18 DIAGNOSIS — I351 Nonrheumatic aortic (valve) insufficiency: Secondary | ICD-10-CM | POA: Diagnosis not present

## 2022-09-18 DIAGNOSIS — I4891 Unspecified atrial fibrillation: Secondary | ICD-10-CM | POA: Diagnosis not present

## 2022-09-24 ENCOUNTER — Other Ambulatory Visit: Payer: Self-pay | Admitting: Internal Medicine

## 2022-09-24 DIAGNOSIS — I4811 Longstanding persistent atrial fibrillation: Secondary | ICD-10-CM

## 2022-10-06 ENCOUNTER — Ambulatory Visit (INDEPENDENT_AMBULATORY_CARE_PROVIDER_SITE_OTHER): Payer: Medicare HMO | Admitting: Internal Medicine

## 2022-10-06 ENCOUNTER — Encounter: Payer: Self-pay | Admitting: Internal Medicine

## 2022-10-06 VITALS — BP 130/80 | HR 67 | Ht 67.25 in | Wt 168.8 lb

## 2022-10-06 DIAGNOSIS — I4811 Longstanding persistent atrial fibrillation: Secondary | ICD-10-CM | POA: Diagnosis not present

## 2022-10-06 DIAGNOSIS — I10 Essential (primary) hypertension: Secondary | ICD-10-CM

## 2022-10-06 DIAGNOSIS — I25118 Atherosclerotic heart disease of native coronary artery with other forms of angina pectoris: Secondary | ICD-10-CM | POA: Diagnosis not present

## 2022-10-06 MED ORDER — AMLODIPINE-OLMESARTAN 5-20 MG PO TABS: 1.0000 | ORAL_TABLET | Freq: Every day | ORAL | 0 refills | Status: DC

## 2022-10-06 NOTE — Progress Notes (Signed)
Established Patient Office Visit  Subjective:  Patient ID: Kyle Santos, male    DOB: June 10, 1936  Age: 87 y.o. MRN: [id number]  Chief Complaint  Patient presents with   Follow-up    3 week follow up    No new complaints, here for lab review and BP follow up. Home bp readings elevated but states he felt lightheaded.      Past Medical History:  Diagnosis Date   A-fib [facility])    Arthritis    BPH (benign prostatic hyperplasia)    Depression    Heart murmur    HOH (hard of hearing)    Hypercholesteremia    Hypertension    Loss of equilibrium    being evaluated at this time.   Pneumonia    history of   Sleep apnea     Social History   Socioeconomic History   Marital status: Married    Spouse name: [name]   Number of children: 3   Years of education: College Degree   Highest education level: Bachelor'Rayshell [employer]ecke degree (e.g., BA, AB, BS)  Occupational History   Occupation: Retired  Tobacco Use   Smoking status: Former    Packs/day: 0.50    Years: 7.00    Total pack years: 3.50    Types: Cigarettes    Quit date: 08/22/1963    Years since quitting: 59.1   Smokeless tobacco: Never  Vaping Use   Vaping Use: Never used  Substance and Sexual Activity   Alcohol use: Yes    Alcohol/week: 0.0 standard drinks of alcohol    Comment: occasional   Drug use: No   Sexual activity: Not Currently    Birth control/protection: None  Other Topics Concern   Not on file  Social History Narrative   Drinks about 2 cups of caffeine.   Social Determinants of Health   Financial Resource Strain: Medium Risk (05/13/2021)   Overall Financial Resource Strain (CARDIA)    Difficulty of Paying Living Expenses: Somewhat hard  Food Insecurity: No Food Insecurity (05/13/2021)   Hunger Vital Sign    Worried About Running Out of Food in the Last Year: Never true    Ran Out of Food in the Last Year: Never true  Transportation Needs: No Transportation Needs (05/13/2021)   PRAPARE - Armed forces logistics/support/administrative officer (Medical): No    Lack of Transportation (Non-Medical): No  Physical Activity: Sufficiently Active (05/13/2021)   Exercise Vital Sign    Days of Exercise per Week: 3 days    Minutes of Exercise per Session: 150+ min  Stress: Not on file  Social Connections: Socially Integrated (05/13/2021)   Social Connection and Isolation Panel [NHANES]    Frequency of Communication with Friends and Family: More than three times a week    Frequency of Social Gatherings with Friends and Family: Once a week    Attends Religious Services: More than 4 times per year    Active Member of [employer] or Organizations: Yes    Attends Music therapist: More than 4 times per year    Marital Status: Married  Human resources officer Violence: Not At Risk (05/13/2021)   Humiliation, Afraid, Rape, and Kick questionnaire    Fear of Current or Ex-Partner: No    Emotionally Abused: No    Physically Abused: No    Sexually Abused: No    Family History  Problem Relation Age of Onset   Heart disease Mother    Heart disease Father  No Known Allergies  Review of Systems  All other systems reviewed and are negative.      Objective:   BP 130/80   Pulse 67   Ht 5' 7.25" (1.708 m)   Wt 168 lb 12.8 oz (76.6 kg)   SpO2 92%   BMI 26.24 kg/m   Vitals:   10/06/22 1110  BP: 130/80  Pulse: 67  Height: 5' 7.25" (1.708 m)  Weight: 168 lb 12.8 oz (76.6 kg)  SpO2: 92%  BMI (Calculated): 26.25    Physical Exam Vitals reviewed.  Constitutional:      Appearance: Normal appearance.  HENT:     Head: Normocephalic.     Left Ear: There is no impacted cerumen.     Nose: Nose normal.     Mouth/Throat:     Mouth: Mucous membranes are moist.     Pharynx: No posterior oropharyngeal erythema.  Eyes:     Extraocular Movements: Extraocular movements intact.     Pupils: Pupils are equal, round, and reactive to light.  Cardiovascular:     Rate and Rhythm: Regular rhythm.     Chest Wall: PMI  is not displaced.     Pulses: Normal pulses.     Heart sounds: Normal heart sounds. No murmur heard. Pulmonary:     Effort: Pulmonary effort is normal.     Breath sounds: Normal air entry. No rhonchi or rales.  Abdominal:     General: Abdomen is flat. Bowel sounds are normal. There is no distension.     Palpations: Abdomen is soft. There is no hepatomegaly, splenomegaly or mass.     Tenderness: There is no abdominal tenderness.  Musculoskeletal:        General: Normal range of motion.     Cervical back: Normal range of motion and neck supple.     Right lower leg: No edema.     Left lower leg: No edema.  Skin:    General: Skin is warm and dry.  Neurological:     General: No focal deficit present.     Mental Status: He is alert and oriented to person, place, and time.     Cranial Nerves: No cranial nerve deficit.     Motor: No weakness.  Psychiatric:        Mood and Affect: Mood normal.        Behavior: Behavior normal.      No results found for any visits on 10/06/22.  Recent Results (from the past 2160 hour([place]))  POCT INR     Status: Abnormal   Collection Time: 07/24/22 10:36 AM  Result Value Ref Range   INR 1.7 (A) 2.0 - 3.0   POC INR        Assessment & Plan:   Problem List Items Addressed This Visit   None  1. Essential hypertension  2. Longstanding persistent atrial fibrillation ([place])  3. Coronary artery disease of native artery of native heart with stable angina pectoris ([place])   No follow-ups on file.   Total time spent: 20 minutes  Volanda Napoleon, MD  10/06/2022

## 2022-11-06 ENCOUNTER — Encounter: Payer: Self-pay | Admitting: Internal Medicine

## 2022-11-06 ENCOUNTER — Ambulatory Visit (INDEPENDENT_AMBULATORY_CARE_PROVIDER_SITE_OTHER): Payer: Medicare HMO | Admitting: Internal Medicine

## 2022-11-06 VITALS — BP 112/64 | HR 74 | Ht 66.0 in | Wt 169.2 lb

## 2022-11-06 DIAGNOSIS — M519 Unspecified thoracic, thoracolumbar and lumbosacral intervertebral disc disorder: Secondary | ICD-10-CM | POA: Diagnosis not present

## 2022-11-06 DIAGNOSIS — R3914 Feeling of incomplete bladder emptying: Secondary | ICD-10-CM

## 2022-11-06 DIAGNOSIS — E782 Mixed hyperlipidemia: Secondary | ICD-10-CM | POA: Diagnosis not present

## 2022-11-06 DIAGNOSIS — I10 Essential (primary) hypertension: Secondary | ICD-10-CM | POA: Diagnosis not present

## 2022-11-06 DIAGNOSIS — N401 Enlarged prostate with lower urinary tract symptoms: Secondary | ICD-10-CM | POA: Diagnosis not present

## 2022-11-06 MED ORDER — AMLODIPINE-OLMESARTAN 5-20 MG PO TABS: 1.0000 | ORAL_TABLET | Freq: Every day | ORAL | 0 refills | Status: DC

## 2022-11-06 MED ORDER — CELECOXIB 200 MG PO CAPS: 200.0000 mg | ORAL_CAPSULE | Freq: Two times a day (BID) | ORAL | 1 refills | Status: DC

## 2022-11-06 MED ORDER — TAMSULOSIN HCL 0.4 MG PO CAPS: 0.4000 mg | ORAL_CAPSULE | Freq: Every day | ORAL | 3 refills | Status: DC

## 2022-11-06 MED ORDER — TIZANIDINE HCL 2 MG PO TABS: 2.0000 mg | ORAL_TABLET | Freq: Three times a day (TID) | ORAL | 1 refills | Status: AC | PRN

## 2022-11-06 NOTE — Progress Notes (Signed)
Established Patient Office Visit  Subjective:  Patient ID: Kyle Santos, male    DOB: [birth year]  Age: 87 y.o. MRN: [id number]  Chief Complaint  Patient presents with   Follow-up    4 week follow up    No new complaints, here for BP follow up. Home bp readings reviewed and satisfactory except for a single reading of 95/60. C/o flare of chronic lower back pain unrelieved by tylenol or otc Naproxen.    No other concerns at this time.   Past Medical History:  Diagnosis Date   A-fib [facility])    Arthritis    BPH (benign prostatic hyperplasia)    Depression    Heart murmur    HOH (hard of hearing)    Hypercholesteremia    Hypertension    Loss of equilibrium    being evaluated at this time.   Pneumonia    history of   Sleep apnea     Past Surgical History:  Procedure Laterality Date   CATARACT EXTRACTION W/PHACO Right 01/09/2017   Procedure: CATARACT EXTRACTION PHACO AND INTRAOCULAR LENS PLACEMENT (IOC);  Surgeon: Birder Robson, MD;  Location: [facility] ORS;  Service: Ophthalmology;  Laterality: Right;  [place] 01:06 AP% 19.7 CDE 13.18 Fluid pack lot # [id number]   CATARACT EXTRACTION W/PHACO Left 08/07/2017   Procedure: CATARACT EXTRACTION PHACO AND INTRAOCULAR LENS PLACEMENT (IOC)-LEFT;  Surgeon: Birder Robson, MD;  Location: [facility] ORS;  Service: Ophthalmology;  Laterality: Left;  [place]  00:43 AP% 15.0 CDE 6.50 Fluid pack lot # [id number]   COLONOSCOPY     JOINT REPLACEMENT     knee   JOINT REPLACEMENT     shoulder    Social History   Socioeconomic History   Marital status: Married    Spouse name: [name]   Number of children: 3   Years of education: College Degree   Highest education level: Bachelor'Adonias Demore degree (e.g., BA, AB, BS)  Occupational History   Occupation: Retired  Tobacco Use   Smoking status: Former    Packs/day: 0.50    Years: 7.00    Additional pack years: 0.00    Total pack years: 3.50    Types: Cigarettes    Quit date: 08/22/1963    Years since  quitting: 59.2   Smokeless tobacco: Never  Vaping Use   Vaping Use: Never used  Substance and Sexual Activity   Alcohol use: Yes    Alcohol/week: 0.0 standard drinks of alcohol    Comment: occasional   Drug use: No   Sexual activity: Not Currently    Birth control/protection: None  Other Topics Concern   Not on file  Social History Narrative   Drinks about 2 cups of caffeine.   Social Determinants of Health   Financial Resource Strain: Medium Risk (05/13/2021)   Overall Financial Resource Strain (CARDIA)    Difficulty of Paying Living Expenses: Somewhat hard  Food Insecurity: No Food Insecurity (05/13/2021)   Hunger Vital Sign    Worried About Running Out of Food in the Last Year: Never true    Ran Out of Food in the Last Year: Never true  Transportation Needs: No Transportation Needs (05/13/2021)   PRAPARE - Hydrologist (Medical): No    Lack of Transportation (Non-Medical): No  Physical Activity: Sufficiently Active (05/13/2021)   Exercise Vital Sign    Days of Exercise per Week: 3 days    Minutes of Exercise per Session: 150+ min  Stress: Not on file  Social Connections: Socially Integrated (05/13/2021)   Social Connection and Isolation Panel [NHANES]    Frequency of Communication with Friends and Family: More than three times a week    Frequency of Social Gatherings with Friends and Family: Once a week    Attends Religious Services: More than 4 times per year    Active Member of [employer] or Organizations: Yes    Attends Music therapist: More than 4 times per year    Marital Status: Married  Human resources officer Violence: Not At Risk (05/13/2021)   Humiliation, Afraid, Rape, and Kick questionnaire    Fear of Current or Ex-Partner: No    Emotionally Abused: No    Physically Abused: No    Sexually Abused: No    Family History  Problem Relation Age of Onset   Heart disease Mother    Heart disease Father     No Known  Allergies  Review of Systems  Constitutional: Negative.        Objective:   BP 112/64   Pulse 74   Ht 5\' 6"  (1.676 m)   Wt 169 lb 3.2 oz (76.7 kg)   SpO2 94%   BMI 27.31 kg/m   Vitals:   11/06/22 1118  BP: 112/64  Pulse: 74  Height: 5\' 6"  (1.676 m)  Weight: 169 lb 3.2 oz (76.7 kg)  SpO2: 94%  BMI (Calculated): 27.32    Physical Exam Vitals reviewed.  Constitutional:      Appearance: Normal appearance.  HENT:     Head: Normocephalic.     Left Ear: There is no impacted cerumen.     Nose: Nose normal.     Mouth/Throat:     Mouth: Mucous membranes are moist.     Pharynx: No posterior oropharyngeal erythema.  Eyes:     Extraocular Movements: Extraocular movements intact.     Pupils: Pupils are equal, round, and reactive to light.  Cardiovascular:     Rate and Rhythm: Regular rhythm.     Chest Wall: PMI is not displaced.     Pulses: Normal pulses.     Heart sounds: Normal heart sounds. No murmur heard. Pulmonary:     Effort: Pulmonary effort is normal.     Breath sounds: Normal air entry. No rhonchi or rales.  Abdominal:     General: Abdomen is flat. Bowel sounds are normal. There is no distension.     Palpations: Abdomen is soft. There is no hepatomegaly, splenomegaly or mass.     Tenderness: There is no abdominal tenderness.  Musculoskeletal:        General: Normal range of motion.     Cervical back: Normal range of motion and neck supple.     Lumbar back: No bony tenderness. Negative right straight leg raise test and negative left straight leg raise test.     Right lower leg: No edema.     Left lower leg: No edema.  Skin:    General: Skin is warm and dry.  Neurological:     General: No focal deficit present.     Mental Status: He is alert and oriented to person, place, and time.     Cranial Nerves: No cranial nerve deficit.     Motor: No weakness.  Psychiatric:        Mood and Affect: Mood normal.        Behavior: Behavior normal.      No results  found for any visits on 11/06/22.  No results found  for this or any previous visit (from the past 2160 hour(Lamonda Noxon)).    Assessment & Plan:   Problem List Items Addressed This Visit   None   No follow-ups on file.   Total time spent: 30 minutes  Volanda Napoleon, MD  11/06/2022

## 2022-11-08 DIAGNOSIS — Z961 Presence of intraocular lens: Secondary | ICD-10-CM | POA: Diagnosis not present

## 2022-11-08 DIAGNOSIS — H43813 Vitreous degeneration, bilateral: Secondary | ICD-10-CM | POA: Diagnosis not present

## 2022-11-16 ENCOUNTER — Other Ambulatory Visit: Payer: Self-pay | Admitting: Cardiovascular Disease

## 2022-11-16 ENCOUNTER — Encounter: Payer: Self-pay | Admitting: Cardiovascular Disease

## 2022-11-16 ENCOUNTER — Ambulatory Visit: Payer: Medicare HMO | Admitting: Cardiovascular Disease

## 2022-11-16 VITALS — BP 92/60 | HR 73 | Ht 66.0 in | Wt 171.4 lb

## 2022-11-16 DIAGNOSIS — G4733 Obstructive sleep apnea (adult) (pediatric): Secondary | ICD-10-CM

## 2022-11-16 DIAGNOSIS — I4811 Longstanding persistent atrial fibrillation: Secondary | ICD-10-CM | POA: Diagnosis not present

## 2022-11-16 DIAGNOSIS — I4891 Unspecified atrial fibrillation: Secondary | ICD-10-CM

## 2022-11-16 DIAGNOSIS — I952 Hypotension due to drugs: Secondary | ICD-10-CM

## 2022-11-16 MED ORDER — ATORVASTATIN CALCIUM 80 MG PO TABS: 80.0000 mg | ORAL_TABLET | Freq: Every day | ORAL | 2 refills | Status: DC

## 2022-11-16 MED ORDER — WARFARIN SODIUM 7.5 MG PO TABS: 7.5000 mg | ORAL_TABLET | Freq: Every day | ORAL | 3 refills | Status: DC

## 2022-11-16 MED ORDER — LOSARTAN POTASSIUM 25 MG PO TABS: 25.0000 mg | ORAL_TABLET | Freq: Every day | ORAL | 11 refills | Status: DC

## 2022-11-16 NOTE — Assessment & Plan Note (Signed)
Lack of energy

## 2022-11-16 NOTE — Progress Notes (Signed)
Cardiology Office Note   Date:  11/16/2022   ID:  Kyle Santos, DOB [birth year], MRN [id number]  PCP:  Cletis Athens, MD  Cardiologist:  Neoma Laming, MD      History of Present Illness: Kyle Santos is a 87 y.o. male who presents for  Chief Complaint  Patient presents with   Follow-up    2 month follow up    Feels tiredness, no SOB.      Past Medical History:  Diagnosis Date   A-fib [facility])    Arthritis    BPH (benign prostatic hyperplasia)    Depression    Heart murmur    HOH (hard of hearing)    Hypercholesteremia    Hypertension    Loss of equilibrium    being evaluated at this time.   Pneumonia    history of   Sleep apnea      Past Surgical History:  Procedure Laterality Date   CATARACT EXTRACTION W/PHACO Right 01/09/2017   Procedure: CATARACT EXTRACTION PHACO AND INTRAOCULAR LENS PLACEMENT (IOC);  Surgeon: Birder Robson, MD;  Location: [facility] ORS;  Service: Ophthalmology;  Laterality: Right;  [place] 01:06 AP% 19.7 CDE 13.18 Fluid pack lot # [id number]   CATARACT EXTRACTION W/PHACO Left 08/07/2017   Procedure: CATARACT EXTRACTION PHACO AND INTRAOCULAR LENS PLACEMENT (IOC)-LEFT;  Surgeon: Birder Robson, MD;  Location: [facility] ORS;  Service: Ophthalmology;  Laterality: Left;  [place]  00:43 AP% 15.0 CDE 6.50 Fluid pack lot # [id number]   COLONOSCOPY     JOINT REPLACEMENT     knee   JOINT REPLACEMENT     shoulder     Current Outpatient Medications  Medication Sig Dispense Refill   B Complex Vitamins (B COMPLEX-B12 PO) Take 1 tablet by mouth daily.     Cascara [place] 450 MG CAPS Take 450 mg by mouth 2 (two) times daily.     Cholecalciferol (VITAMIN D3) 1000 units CAPS Take 1,000 Units by mouth daily at 3 pm.     losartan (COZAAR) 25 MG tablet Take 1 tablet (25 mg total) by mouth daily. 30 tablet 11   Melatonin 5 MG TABS Take 5 mg by mouth at bedtime.      tamsulosin (FLOMAX) 0.4 MG CAPS capsule Take 1 capsule (0.4 mg total) by mouth daily. 90 capsule 3    tiZANidine (ZANAFLEX) 2 MG tablet Take 1 tablet (2 mg total) by mouth every 8 (eight) hours as needed for up to 20 days for muscle spasms. 30 tablet 1   traZODone (DESYREL) 50 MG tablet Take 25 mg by mouth at bedtime.     atorvastatin (LIPITOR) 80 MG tablet Take 1 tablet (80 mg total) by mouth daily. 90 tablet 2   celecoxib (CELEBREX) 200 MG capsule Take 1 capsule (200 mg total) by mouth 2 (two) times daily. As needed for back pain 60 capsule 1   naproxen sodium (ALEVE) 220 MG tablet Take 220 mg by mouth daily as needed.     warfarin (COUMADIN) 7.5 MG tablet Take 1 tablet (7.5 mg total) by mouth daily. 90 tablet 3   No current facility-administered medications for this visit.    Allergies:   Patient has no known allergies.    Social History:   reports that he quit smoking about 59 years ago. His smoking use included cigarettes. He has a 3.50 pack-year smoking history. He has never used smokeless tobacco. He reports current alcohol use. He reports that he does not use drugs.  Family History:  family history includes Heart disease in his father and mother.    ROS:     Review of Systems  Constitutional: Negative.   HENT: Negative.    Eyes: Negative.   Respiratory: Negative.    Gastrointestinal: Negative.   Genitourinary: Negative.   Musculoskeletal: Negative.   Skin: Negative.   Neurological: Negative.   Endo/Heme/Allergies: Negative.   Psychiatric/Behavioral: Negative.    All other systems reviewed and are negative.     All other systems are reviewed and negative.    PHYSICAL EXAM: VS:  BP 92/60   Pulse 73   Ht 5\' 6"  (1.676 m)   Wt 171 lb 6.4 oz (77.7 kg)   SpO2 96%   BMI 27.66 kg/m  , BMI Body mass index is 27.66 kg/m. Last weight:  Wt Readings from Last 3 Encounters:  11/16/22 171 lb 6.4 oz (77.7 kg)  11/06/22 169 lb 3.2 oz (76.7 kg)  10/06/22 168 lb 12.8 oz (76.6 kg)     Physical Exam Vitals reviewed.  Constitutional:      Appearance: Normal appearance.  He is normal weight.  HENT:     Head: Normocephalic.     Nose: Nose normal.     Mouth/Throat:     Mouth: Mucous membranes are moist.  Eyes:     Pupils: Pupils are equal, round, and reactive to light.  Cardiovascular:     Rate and Rhythm: Normal rate and regular rhythm.     Pulses: Normal pulses.     Heart sounds: Normal heart sounds.  Pulmonary:     Effort: Pulmonary effort is normal.  Abdominal:     General: Abdomen is flat. Bowel sounds are normal.  Musculoskeletal:        General: Normal range of motion.     Cervical back: Normal range of motion.  Skin:    General: Skin is warm.  Neurological:     General: No focal deficit present.     Mental Status: He is alert.  Psychiatric:        Mood and Affect: Mood normal.       EKG:   Recent Labs: No results found for requested labs within last 365 days.    Lipid Panel No results found for: "CHOL", "TRIG", "HDL", "CHOLHDL", "VLDL", "LDLCALC", "LDLDIRECT"    Other studies Reviewed: Additional studies/ records that were reviewed today include:  Review of the above records demonstrates:       No data to display            ASSESSMENT AND PLAN:    ICD-10-CM   1. Longstanding persistent atrial fibrillation (HCC)  I48.11 losartan (COZAAR) 25 MG tablet    atorvastatin (LIPITOR) 80 MG tablet    2. Hypotension due to drugs  I95.2 losartan (COZAAR) 25 MG tablet    atorvastatin (LIPITOR) 80 MG tablet    3. OSA on CPAP  G47.33 losartan (COZAAR) 25 MG tablet    atorvastatin (LIPITOR) 80 MG tablet    4. Atrial fibrillation, unspecified type (HCC)  I48.91 warfarin (COUMADIN) 7.5 MG tablet       Problem List Items Addressed This Visit       Cardiovascular and Mediastinum   Hypotension due to drugs   Relevant Medications   losartan (COZAAR) 25 MG tablet   atorvastatin (LIPITOR) 80 MG tablet   warfarin (COUMADIN) 7.5 MG tablet   Atrial fibrillation (HCC) - Primary   Relevant Medications   losartan (COZAAR) 25 MG  tablet  atorvastatin (LIPITOR) 80 MG tablet   warfarin (COUMADIN) 7.5 MG tablet     Respiratory   OSA on CPAP    Lack of energy      Relevant Medications   losartan (COZAAR) 25 MG tablet   atorvastatin (LIPITOR) 80 MG tablet       Disposition:   No follow-ups on file.    Total time spent: 30 minutes  Signed,  Neoma Laming, MD  11/16/2022 10:48 AM    [place]

## 2022-11-16 NOTE — Addendum Note (Signed)
Addended by: Neoma Laming A on: 11/16/2022 02:28 PM   Modules accepted: Orders

## 2022-11-16 NOTE — Addendum Note (Signed)
Addended by: Neoma Laming A on: 11/16/2022 11:53 AM   Modules accepted: Orders

## 2022-11-16 NOTE — Addendum Note (Signed)
Addended by: Neoma Laming A on: 11/16/2022 11:52 AM   Modules accepted: Orders

## 2022-11-16 NOTE — Addendum Note (Signed)
Addended by: Neoma Laming A on: 11/16/2022 11:20 AM   Modules accepted: Orders

## 2022-11-17 LAB — PROTIME-INR
INR: 2.8 — ABNORMAL HIGH (ref 0.9–1.2)
Prothrombin Time: 27.8 s — ABNORMAL HIGH (ref 9.1–12.0)

## 2022-11-20 ENCOUNTER — Encounter: Payer: Self-pay | Admitting: Cardiovascular Disease

## 2022-12-15 ENCOUNTER — Ambulatory Visit: Payer: Medicare HMO | Admitting: Cardiovascular Disease

## 2022-12-15 ENCOUNTER — Encounter: Payer: Self-pay | Admitting: Cardiovascular Disease

## 2022-12-15 VITALS — BP 124/84 | HR 72 | Ht 66.0 in | Wt 169.6 lb

## 2022-12-15 DIAGNOSIS — I10 Essential (primary) hypertension: Secondary | ICD-10-CM

## 2022-12-15 DIAGNOSIS — G4733 Obstructive sleep apnea (adult) (pediatric): Secondary | ICD-10-CM

## 2022-12-15 DIAGNOSIS — I480 Paroxysmal atrial fibrillation: Secondary | ICD-10-CM

## 2022-12-15 NOTE — Progress Notes (Signed)
Cardiology Office Note   Date:  12/15/2022   ID:  Kyle Santos, DOB 01-21-1936, MRN [id number]  PCP:  Sherron Monday, MD  Cardiologist:  Adrian Blackwater, MD      History of Present Illness: Kyle Santos is a 87 y.o. male who presents for  Chief Complaint  Patient presents with   Follow-up    1 month follow up    Patient in office for 1 month follow up. Denies chest pain. Shortness of breath on exertion, unchanged. Fatigue improved. Lower extremity edema, left worse than right.     Past Medical History:  Diagnosis Date   A-fib [facility])    Arthritis    BPH (benign prostatic hyperplasia)    Depression    Heart murmur    HOH (hard of hearing)    Hypercholesteremia    Hypertension    Loss of equilibrium    being evaluated at this time.   Pneumonia    history of   Sleep apnea      Past Surgical History:  Procedure Laterality Date   CATARACT EXTRACTION W/PHACO Right 01/09/2017   Procedure: CATARACT EXTRACTION PHACO AND INTRAOCULAR LENS PLACEMENT (IOC);  Surgeon: Galen Manila, MD;  Location: [facility] ORS;  Service: Ophthalmology;  Laterality: Right;  [place] 01:06 AP% 19.7 CDE 13.18 Fluid pack lot # [id number]   CATARACT EXTRACTION W/PHACO Left 08/07/2017   Procedure: CATARACT EXTRACTION PHACO AND INTRAOCULAR LENS PLACEMENT (IOC)-LEFT;  Surgeon: Galen Manila, MD;  Location: [facility] ORS;  Service: Ophthalmology;  Laterality: Left;  [place]  00:43 AP% 15.0 CDE 6.50 Fluid pack lot # [id number]   COLONOSCOPY     JOINT REPLACEMENT     knee   JOINT REPLACEMENT     shoulder     Current Outpatient Medications  Medication Sig Dispense Refill   atorvastatin (LIPITOR) 80 MG tablet Take 1 tablet (80 mg total) by mouth daily. 90 tablet 2   B Complex Vitamins (B COMPLEX-B12 PO) Take 1 tablet by mouth daily.     Cascara Sagrada 450 MG CAPS Take 450 mg by mouth 2 (two) times daily.     celecoxib (CELEBREX) 200 MG capsule Take 1 capsule (200 mg total) by mouth 2 (two) times daily.  As needed for back pain 60 capsule 1   Cholecalciferol (VITAMIN D3) 1000 units CAPS Take 1,000 Units by mouth daily at 3 pm.     losartan (COZAAR) 25 MG tablet Take 1 tablet (25 mg total) by mouth daily. 30 tablet 11   Melatonin 5 MG TABS Take 5 mg by mouth at bedtime.      naproxen sodium (ALEVE) 220 MG tablet Take 220 mg by mouth daily as needed.     tamsulosin (FLOMAX) 0.4 MG CAPS capsule Take 1 capsule (0.4 mg total) by mouth daily. 90 capsule 3   traZODone (DESYREL) 50 MG tablet Take 25 mg by mouth at bedtime.     warfarin (COUMADIN) 7.5 MG tablet Take 1 tablet (7.5 mg total) by mouth daily. 90 tablet 3   No current facility-administered medications for this visit.    Allergies:   Patient has no known allergies.    Social History:   reports that he quit smoking about 59 years ago. His smoking use included cigarettes. He has a 3.50 pack-year smoking history. He has never used smokeless tobacco. He reports current alcohol use. He reports that he does not use drugs.   Family History:  family history includes Heart disease in his  father and mother.    ROS:     Review of Systems  Constitutional: Negative.   HENT: Negative.    Eyes: Negative.   Respiratory: Negative.    Cardiovascular:  Positive for leg swelling.  Gastrointestinal: Negative.   Genitourinary: Negative.   Musculoskeletal: Negative.   Skin: Negative.   Neurological: Negative.   Endo/Heme/Allergies: Negative.   Psychiatric/Behavioral: Negative.    All other systems reviewed and are negative.   All other systems are reviewed and negative.   PHYSICAL EXAM: VS:  BP 124/84   Pulse 72   Ht 5\' 6"  (1.676 m)   Wt 169 lb 9.6 oz (76.9 kg)   SpO2 98%   BMI 27.37 kg/m  , BMI Body mass index is 27.37 kg/m. Last weight:  Wt Readings from Last 3 Encounters:  12/15/22 169 lb 9.6 oz (76.9 kg)  11/16/22 171 lb 6.4 oz (77.7 kg)  11/06/22 169 lb 3.2 oz (76.7 kg)   Physical Exam Vitals reviewed.  Constitutional:       Appearance: Normal appearance. He is normal weight.  HENT:     Head: Normocephalic.     Nose: Nose normal.     Mouth/Throat:     Mouth: Mucous membranes are moist.  Eyes:     Pupils: Pupils are equal, round, and reactive to light.  Cardiovascular:     Rate and Rhythm: Normal rate and regular rhythm.     Pulses: Normal pulses.     Heart sounds: Normal heart sounds.  Pulmonary:     Effort: Pulmonary effort is normal.  Abdominal:     General: Abdomen is flat. Bowel sounds are normal.  Musculoskeletal:        General: Normal range of motion.     Cervical back: Normal range of motion.  Skin:    General: Skin is warm.  Neurological:     General: No focal deficit present.     Mental Status: He is alert.  Psychiatric:        Mood and Affect: Mood normal.     EKG: none today  Recent Labs: No results found for requested labs within last 365 days.    Lipid Panel No results found for: "CHOL", "TRIG", "HDL", "CHOLHDL", "VLDL", "LDLCALC", "LDLDIRECT"    ASSESSMENT AND PLAN:    ICD-10-CM   1. Essential hypertension  I10     2. Paroxysmal atrial fibrillation (HCC)  I48.0     3. OSA on CPAP  G47.33        Problem List Items Addressed This Visit       Cardiovascular and Mediastinum   Atrial fibrillation [facility])   Essential hypertension - Primary    Patient doing well. No acute complaints. Blood pressure improved on lower dose of losartan. Fatigue improving.         Respiratory   OSA on CPAP    Not currently using a cpap.        Disposition:   Return in about 2 months (around 02/14/2023).    Total time spent: 30 minutes  Signed,  Adrian Blackwater, MD  12/15/2022 10:08 AM    [facility]

## 2022-12-15 NOTE — Assessment & Plan Note (Signed)
Patient doing well. No acute complaints. Blood pressure improved on lower dose of losartan. Fatigue improving.

## 2022-12-15 NOTE — Assessment & Plan Note (Signed)
Not currently using a cpap.

## 2022-12-29 ENCOUNTER — Ambulatory Visit (INDEPENDENT_AMBULATORY_CARE_PROVIDER_SITE_OTHER): Payer: Medicare HMO | Admitting: Internal Medicine

## 2022-12-29 ENCOUNTER — Encounter: Payer: Self-pay | Admitting: Internal Medicine

## 2022-12-29 VITALS — BP 136/88 | HR 55 | Ht 66.0 in | Wt 169.6 lb

## 2022-12-29 DIAGNOSIS — H68101 Unspecified obstruction of Eustachian tube, right ear: Secondary | ICD-10-CM | POA: Diagnosis not present

## 2022-12-29 DIAGNOSIS — J301 Allergic rhinitis due to pollen: Secondary | ICD-10-CM | POA: Diagnosis not present

## 2022-12-29 DIAGNOSIS — G20C Parkinsonism, unspecified: Secondary | ICD-10-CM | POA: Diagnosis not present

## 2022-12-29 DIAGNOSIS — I10 Essential (primary) hypertension: Secondary | ICD-10-CM | POA: Diagnosis not present

## 2022-12-29 MED ORDER — CETIRIZINE HCL 10 MG PO TABS
10.0000 mg | ORAL_TABLET | Freq: Every day | ORAL | 2 refills | Status: DC
Start: 2022-12-29 — End: 2023-02-05

## 2022-12-29 MED ORDER — FLUTICASONE PROPIONATE 50 MCG/ACT NA SUSP
1.0000 | Freq: Every day | NASAL | 2 refills | Status: DC
Start: 2022-12-29 — End: 2023-04-10

## 2022-12-29 MED ORDER — MECLIZINE HCL 25 MG PO TABS
25.0000 mg | ORAL_TABLET | Freq: Three times a day (TID) | ORAL | 1 refills | Status: DC | PRN
Start: 2022-12-29 — End: 2023-02-05

## 2022-12-29 NOTE — Progress Notes (Signed)
Established Patient Office Visit  Subjective:  Patient ID: Kyle Santos, male    DOB: 01-18-1936  Age: 87 y.o. MRN: [id number]  Chief Complaint  Patient presents with   Acute Visit    Possible right ear infection and off balance    C/o right ear pain, swelling  and loss of balance. C/o flare of seasonal allergies with a rash behind his ear that has improved with topical steroids. Denies any vertigo. Also c/o right nasal stuffiness and denies use of otc nasal spray or antihistamines.    No other concerns at this time.   Past Medical History:  Diagnosis Date   A-fib [facility])    Arthritis    BPH (benign prostatic hyperplasia)    Depression    Heart murmur    HOH (hard of hearing)    Hypercholesteremia    Hypertension    Loss of equilibrium    being evaluated at this time.   Pneumonia    history of   Sleep apnea     Past Surgical History:  Procedure Laterality Date   CATARACT EXTRACTION W/PHACO Right 01/09/2017   Procedure: CATARACT EXTRACTION PHACO AND INTRAOCULAR LENS PLACEMENT (IOC);  Surgeon: Galen Manila, MD;  Location: [facility] ORS;  Service: Ophthalmology;  Laterality: Right;  [place] 01:06 AP% 19.7 CDE 13.18 Fluid pack lot # [id number]   CATARACT EXTRACTION W/PHACO Left 08/07/2017   Procedure: CATARACT EXTRACTION PHACO AND INTRAOCULAR LENS PLACEMENT (IOC)-LEFT;  Surgeon: Galen Manila, MD;  Location: [facility] ORS;  Service: Ophthalmology;  Laterality: Left;  [place]  00:43 AP% 15.0 CDE 6.50 Fluid pack lot # [id number]   COLONOSCOPY     JOINT REPLACEMENT     knee   JOINT REPLACEMENT     shoulder    Social History   Socioeconomic History   Marital status: Married    Spouse name: Kyle Santos   Number of children: 3   Years of education: College Degree   Highest education level: Bachelor'Kyle Santos degree (e.g., BA, AB, BS)  Occupational History   Occupation: Retired  Tobacco Use   Smoking status: Former    Packs/day: 0.50    Years: 7.00    Additional pack years: 0.00    Total  pack years: 3.50    Types: Cigarettes    Quit date: 08/22/1963    Years since quitting: 59.3   Smokeless tobacco: Never  Vaping Use   Vaping Use: Never used  Substance and Sexual Activity   Alcohol use: Yes    Alcohol/week: 0.0 standard drinks of alcohol    Comment: occasional   Drug use: No   Sexual activity: Not Currently    Birth control/protection: None  Other Topics Concern   Not on file  Social History Narrative   Drinks about 2 cups of caffeine.   Social Determinants of Health   Financial Resource Strain: Medium Risk (05/13/2021)   Overall Financial Resource Strain (CARDIA)    Difficulty of Paying Living Expenses: Somewhat hard  Food Insecurity: No Food Insecurity (05/13/2021)   Hunger Vital Sign    Worried About Running Out of Food in the Last Year: Never true    Ran Out of Food in the Last Year: Never true  Transportation Needs: No Transportation Needs (05/13/2021)   PRAPARE - Administrator, Civil Service (Medical): No    Lack of Transportation (Non-Medical): No  Physical Activity: Sufficiently Active (05/13/2021)   Exercise Vital Sign    Days of Exercise per Week: 3 days  Minutes of Exercise per Session: 150+ min  Stress: Not on file  Social Connections: Socially Integrated (05/13/2021)   Social Connection and Isolation Panel [NHANES]    Frequency of Communication with Friends and Family: More than three times a week    Frequency of Social Gatherings with Friends and Family: Once a week    Attends Religious Services: More than 4 times per year    Active Member of [employer] or Organizations: Yes    Attends Engineer, structural: More than 4 times per year    Marital Status: Married  Catering manager Violence: Not At Risk (05/13/2021)   Humiliation, Afraid, Rape, and Kick questionnaire    Fear of Current or Ex-Partner: No    Emotionally Abused: No    Physically Abused: No    Sexually Abused: No    Family History  Problem Relation Age of Onset    Heart disease Mother    Heart disease Father     No Known Allergies  Review of Systems  All other systems reviewed and are negative.      Objective:   BP 136/88   Pulse (!) 55   Ht 5\' 6"  (1.676 m)   Wt 169 lb 9.6 oz (76.9 kg)   SpO2 96%   BMI 27.37 kg/m   Vitals:   12/29/22 1003  BP: 136/88  Pulse: (!) 55  Height: 5\' 6"  (1.676 m)  Weight: 169 lb 9.6 oz (76.9 kg)  SpO2: 96%  BMI (Calculated): 27.39    Physical Exam Vitals reviewed.  Constitutional:      Appearance: Normal appearance.  HENT:     Head: Normocephalic.     Right Ear: Hearing normal. No drainage. Tympanic membrane is not injected.     Left Ear: Hearing normal. No drainage. There is no impacted cerumen. Tympanic membrane is not injected.     Ears:     Comments: Dull TM    Nose: Nose normal.     Mouth/Throat:     Mouth: Mucous membranes are moist.     Pharynx: No posterior oropharyngeal erythema.  Eyes:     Extraocular Movements: Extraocular movements intact.     Pupils: Pupils are equal, round, and reactive to light.  Cardiovascular:     Rate and Rhythm: Regular rhythm.     Chest Wall: PMI is not displaced.     Pulses: Normal pulses.     Heart sounds: Normal heart sounds. No murmur heard. Pulmonary:     Effort: Pulmonary effort is normal.     Breath sounds: Normal air entry. No rhonchi or rales.  Abdominal:     General: Abdomen is flat. Bowel sounds are normal. There is no distension.     Palpations: Abdomen is soft. There is no hepatomegaly, splenomegaly or mass.     Tenderness: There is no abdominal tenderness.  Musculoskeletal:        General: Normal range of motion.     Cervical back: Normal range of motion and neck supple.     Right lower leg: No edema.     Left lower leg: No edema.  Skin:    General: Skin is warm and dry.  Neurological:     General: No focal deficit present.     Mental Status: He is alert and oriented to person, place, and time.     Cranial Nerves: No cranial  nerve deficit.     Motor: No weakness.  Psychiatric:        Mood  and Affect: Mood normal.        Behavior: Behavior normal.      No results found for any visits on 12/29/22.  Recent Results (from the past 2160 hour(Leann Mayweather))  Protime-INR     Status: Abnormal   Collection Time: 11/16/22  2:45 PM  Result Value Ref Range   INR 2.8 (H) 0.9 - 1.2    Comment: Reference interval is for non-anticoagulated patients. Suggested INR therapeutic range for Vitamin K antagonist therapy:    Standard Dose (moderate intensity                   therapeutic range):       2.0 - 3.0    Higher intensity therapeutic range       2.5 - 3.5    Prothrombin Time 27.8 (H) 9.1 - 12.0 sec      Assessment & Plan:   Problem List Items Addressed This Visit   None   No follow-ups on file.   Total time spent: 30 minutes  Luna Fuse, MD  12/29/2022

## 2023-01-06 ENCOUNTER — Other Ambulatory Visit: Payer: Self-pay | Admitting: Internal Medicine

## 2023-01-06 DIAGNOSIS — M519 Unspecified thoracic, thoracolumbar and lumbosacral intervertebral disc disorder: Secondary | ICD-10-CM

## 2023-01-08 ENCOUNTER — Ambulatory Visit (INDEPENDENT_AMBULATORY_CARE_PROVIDER_SITE_OTHER): Payer: Medicare HMO | Admitting: Internal Medicine

## 2023-01-08 ENCOUNTER — Encounter: Payer: Self-pay | Admitting: Internal Medicine

## 2023-01-08 VITALS — BP 120/80 | HR 63 | Ht 66.0 in | Wt 172.6 lb

## 2023-01-08 DIAGNOSIS — H68101 Unspecified obstruction of Eustachian tube, right ear: Secondary | ICD-10-CM

## 2023-01-08 DIAGNOSIS — I480 Paroxysmal atrial fibrillation: Secondary | ICD-10-CM | POA: Diagnosis not present

## 2023-01-08 DIAGNOSIS — I10 Essential (primary) hypertension: Secondary | ICD-10-CM | POA: Diagnosis not present

## 2023-01-08 DIAGNOSIS — R202 Paresthesia of skin: Secondary | ICD-10-CM | POA: Diagnosis not present

## 2023-01-08 DIAGNOSIS — E782 Mixed hyperlipidemia: Secondary | ICD-10-CM

## 2023-01-08 DIAGNOSIS — Z0001 Encounter for general adult medical examination with abnormal findings: Secondary | ICD-10-CM | POA: Diagnosis not present

## 2023-01-08 NOTE — Progress Notes (Addendum)
Established Patient Office Visit  Subjective:  Patient ID: Kyle Santos, male    DOB: 09/15/1935  Age: 87 y.o. MRN: [id number]  Chief Complaint  Patient presents with   Annual Exam    AWV    No new complaints, here for AWV, refer to Quality measures and scanned documents.  Balance has improved and ambulating without a walker and has resumed driving. Home nurse visited and was positive for peripheral neuropathy screening.    No other concerns at this time.   Past Medical History:  Diagnosis Date   A-fib [facility])    Arthritis    BPH (benign prostatic hyperplasia)    Depression    Heart murmur    HOH (hard of hearing)    Hypercholesteremia    Hypertension    Loss of equilibrium    being evaluated at this time.   Pneumonia    history of   Sleep apnea     Past Surgical History:  Procedure Laterality Date   CATARACT EXTRACTION W/PHACO Right 01/09/2017   Procedure: CATARACT EXTRACTION PHACO AND INTRAOCULAR LENS PLACEMENT (IOC);  Surgeon: Galen Manila, MD;  Location: [facility] ORS;  Service: Ophthalmology;  Laterality: Right;  [place] 01:06 AP% 19.7 CDE 13.18 Fluid pack lot # [id number]   CATARACT EXTRACTION W/PHACO Left 08/07/2017   Procedure: CATARACT EXTRACTION PHACO AND INTRAOCULAR LENS PLACEMENT (IOC)-LEFT;  Surgeon: Galen Manila, MD;  Location: [facility] ORS;  Service: Ophthalmology;  Laterality: Left;  [place]  00:43 AP% 15.0 CDE 6.50 Fluid pack lot # [id number]   COLONOSCOPY     JOINT REPLACEMENT     knee   JOINT REPLACEMENT     shoulder    Social History   Socioeconomic History   Marital status: Married    Spouse name: Bonita Quin   Number of children: 3   Years of education: College Degree   Highest education level: Bachelor'Ashli Selders degree (e.g., BA, AB, BS)  Occupational History   Occupation: Retired  Tobacco Use   Smoking status: Former    Packs/day: 0.50    Years: 7.00    Additional pack years: 0.00    Total pack years: 3.50    Types: Cigarettes    Quit date: 08/22/1963     Years since quitting: 59.4   Smokeless tobacco: Never  Vaping Use   Vaping Use: Never used  Substance and Sexual Activity   Alcohol use: Yes    Alcohol/week: 0.0 standard drinks of alcohol    Comment: occasional   Drug use: No   Sexual activity: Not Currently    Birth control/protection: None  Other Topics Concern   Not on file  Social History Narrative   Drinks about 2 cups of caffeine.   Social Determinants of Health   Financial Resource Strain: Medium Risk (05/13/2021)   Overall Financial Resource Strain (CARDIA)    Difficulty of Paying Living Expenses: Somewhat hard  Food Insecurity: No Food Insecurity (05/13/2021)   Hunger Vital Sign    Worried About Running Out of Food in the Last Year: Never true    Ran Out of Food in the Last Year: Never true  Transportation Needs: No Transportation Needs (05/13/2021)   PRAPARE - Administrator, Civil Service (Medical): No    Lack of Transportation (Non-Medical): No  Physical Activity: Sufficiently Active (05/13/2021)   Exercise Vital Sign    Days of Exercise per Week: 3 days    Minutes of Exercise per Session: 150+ min  Stress: Not on file  Social Connections: Socially Integrated (05/13/2021)   Social Connection and Isolation Panel [NHANES]    Frequency of Communication with Friends and Family: More than three times a week    Frequency of Social Gatherings with Friends and Family: Once a week    Attends Religious Services: More than 4 times per year    Active Member of [employer] or Organizations: Yes    Attends Engineer, structural: More than 4 times per year    Marital Status: Married  Catering manager Violence: Not At Risk (05/13/2021)   Humiliation, Afraid, Rape, and Kick questionnaire    Fear of Current or Ex-Partner: No    Emotionally Abused: No    Physically Abused: No    Sexually Abused: No    Family History  Problem Relation Age of Onset   Heart disease Mother    Heart disease Father     No Known  Allergies  Review of Systems  All other systems reviewed and are negative.      Objective:   BP 120/80   Pulse 63   Ht 5\' 6"  (1.676 m)   Wt 172 lb 9.6 oz (78.3 kg)   SpO2 95%   BMI 27.86 kg/m   Vitals:   01/08/23 1106  BP: 120/80  Pulse: 63  Height: 5\' 6"  (1.676 m)  Weight: 172 lb 9.6 oz (78.3 kg)  SpO2: 95%  BMI (Calculated): 27.87    Physical Exam Vitals reviewed.  Constitutional:      Appearance: Normal appearance.  HENT:     Head: Normocephalic.     Right Ear: Hearing normal. No drainage. Tympanic membrane is not injected.     Left Ear: Hearing normal. No drainage. There is no impacted cerumen. Tympanic membrane is not injected.     Ears:     Comments: Dull TM    Nose: Nose normal.     Mouth/Throat:     Mouth: Mucous membranes are moist.     Pharynx: No posterior oropharyngeal erythema.  Eyes:     Extraocular Movements: Extraocular movements intact.     Pupils: Pupils are equal, round, and reactive to light.  Cardiovascular:     Rate and Rhythm: Regular rhythm.     Chest Wall: PMI is not displaced.     Pulses: Normal pulses.     Heart sounds: Normal heart sounds. No murmur heard. Pulmonary:     Effort: Pulmonary effort is normal.     Breath sounds: Normal air entry. No rhonchi or rales.  Abdominal:     General: Abdomen is flat. Bowel sounds are normal. There is no distension.     Palpations: Abdomen is soft. There is no hepatomegaly, splenomegaly or mass.     Tenderness: There is no abdominal tenderness.  Musculoskeletal:        General: Normal range of motion.     Cervical back: Normal range of motion and neck supple.     Right lower leg: No edema.     Left lower leg: No edema.  Skin:    General: Skin is warm and dry.  Neurological:     General: No focal deficit present.     Mental Status: He is alert and oriented to person, place, and time.     Cranial Nerves: No cranial nerve deficit.     Motor: No weakness.  Psychiatric:        Mood and  Affect: Mood normal.        Behavior: Behavior normal.  No results found for any visits on 01/08/23.  Recent Results (from the past 2160 hour(Devyne Hauger))  Protime-INR     Status: Abnormal   Collection Time: 11/16/22  2:45 PM  Result Value Ref Range   INR 2.8 (H) 0.9 - 1.2    Comment: Reference interval is for non-anticoagulated patients. Suggested INR therapeutic range for Vitamin K antagonist therapy:    Standard Dose (moderate intensity                   therapeutic range):       2.0 - 3.0    Higher intensity therapeutic range       2.5 - 3.5    Prothrombin Time 27.8 (H) 9.1 - 12.0 sec      Assessment & Plan:  As per problem list. Problem List Items Addressed This Visit       Cardiovascular and Mediastinum   Atrial fibrillation (HCC)   Essential hypertension - Primary   Relevant Orders   Vitamin D (25 hydroxy)     Other   Mixed hyperlipidemia   Other Visit Diagnoses     Blocked Eustachian tube, right           No follow-ups on file.   Total time spent: 35 minutes  Luna Fuse, MD  01/08/2023   This document may have been prepared by [facility] Voice Recognition software and as such may include unintentional dictation errors.

## 2023-01-17 DIAGNOSIS — M25512 Pain in left shoulder: Secondary | ICD-10-CM | POA: Diagnosis not present

## 2023-02-05 ENCOUNTER — Ambulatory Visit (INDEPENDENT_AMBULATORY_CARE_PROVIDER_SITE_OTHER): Payer: Medicare HMO | Admitting: Internal Medicine

## 2023-02-05 DIAGNOSIS — J301 Allergic rhinitis due to pollen: Secondary | ICD-10-CM

## 2023-02-05 DIAGNOSIS — H68101 Unspecified obstruction of Eustachian tube, right ear: Secondary | ICD-10-CM | POA: Diagnosis not present

## 2023-02-05 MED ORDER — CETIRIZINE HCL 10 MG PO TABS
10.0000 mg | ORAL_TABLET | Freq: Every day | ORAL | 2 refills | Status: DC
Start: 2023-02-05 — End: 2023-04-25

## 2023-02-05 MED ORDER — MECLIZINE HCL 25 MG PO TABS
25.0000 mg | ORAL_TABLET | Freq: Three times a day (TID) | ORAL | 1 refills | Status: DC | PRN
Start: 2023-02-05 — End: 2024-05-02

## 2023-02-05 NOTE — Progress Notes (Signed)
Established Patient Office Visit  Subjective:  Patient ID: Kyle Santos, male    DOB: Mar 03, 1936  Age: 87 y.o. MRN: [id number]  Chief Complaint  Patient presents with   Follow-up    Off Balance    C/o left sided headache and right ear "pressure" with exacerbation of dizziness. However failed to renew his antihistamines or meclizine since his last flare up of seasonal allergies.    No other concerns at this time.   Past Medical History:  Diagnosis Date   A-fib [facility])    Arthritis    BPH (benign prostatic hyperplasia)    Depression    Heart murmur    HOH (hard of hearing)    Hypercholesteremia    Hypertension    Loss of equilibrium    being evaluated at this time.   Pneumonia    history of   Sleep apnea     Past Surgical History:  Procedure Laterality Date   CATARACT EXTRACTION W/PHACO Right 01/09/2017   Procedure: CATARACT EXTRACTION PHACO AND INTRAOCULAR LENS PLACEMENT (IOC);  Surgeon: Galen Manila, MD;  Location: [facility] ORS;  Service: Ophthalmology;  Laterality: Right;  [place] 01:06 AP% 19.7 CDE 13.18 Fluid pack lot # [id number]   CATARACT EXTRACTION W/PHACO Left 08/07/2017   Procedure: CATARACT EXTRACTION PHACO AND INTRAOCULAR LENS PLACEMENT (IOC)-LEFT;  Surgeon: Galen Manila, MD;  Location: [facility] ORS;  Service: Ophthalmology;  Laterality: Left;  [place]  00:43 AP% 15.0 CDE 6.50 Fluid pack lot # [id number]   COLONOSCOPY     JOINT REPLACEMENT     knee   JOINT REPLACEMENT     shoulder    Social History   Socioeconomic History   Marital status: Married    Spouse name: Bonita Quin   Number of children: 3   Years of education: College Degree   Highest education level: Bachelor'Aidynn Polendo degree (e.g., BA, AB, BS)  Occupational History   Occupation: Retired  Tobacco Use   Smoking status: Former    Packs/day: 0.50    Years: 7.00    Additional pack years: 0.00    Total pack years: 3.50    Types: Cigarettes    Quit date: 08/22/1963    Years since quitting: 59.4   Smokeless  tobacco: Never  Vaping Use   Vaping Use: Never used  Substance and Sexual Activity   Alcohol use: Yes    Alcohol/week: 0.0 standard drinks of alcohol    Comment: occasional   Drug use: No   Sexual activity: Not Currently    Birth control/protection: None  Other Topics Concern   Not on file  Social History Narrative   Drinks about 2 cups of caffeine.   Social Determinants of Health   Financial Resource Strain: Medium Risk (05/13/2021)   Overall Financial Resource Strain (CARDIA)    Difficulty of Paying Living Expenses: Somewhat hard  Food Insecurity: No Food Insecurity (05/13/2021)   Hunger Vital Sign    Worried About Running Out of Food in the Last Year: Never true    Ran Out of Food in the Last Year: Never true  Transportation Needs: No Transportation Needs (05/13/2021)   PRAPARE - Administrator, Civil Service (Medical): No    Lack of Transportation (Non-Medical): No  Physical Activity: Sufficiently Active (05/13/2021)   Exercise Vital Sign    Days of Exercise per Week: 3 days    Minutes of Exercise per Session: 150+ min  Stress: Not on file  Social Connections: Socially Integrated (05/13/2021)   Social  Connection and Isolation Panel [NHANES]    Frequency of Communication with Friends and Family: More than three times a week    Frequency of Social Gatherings with Friends and Family: Once a week    Attends Religious Services: More than 4 times per year    Active Member of [employer] or Organizations: Yes    Attends Engineer, structural: More than 4 times per year    Marital Status: Married  Catering manager Violence: Not At Risk (05/13/2021)   Humiliation, Afraid, Rape, and Kick questionnaire    Fear of Current or Ex-Partner: No    Emotionally Abused: No    Physically Abused: No    Sexually Abused: No    Family History  Problem Relation Age of Onset   Heart disease Mother    Heart disease Father     No Known Allergies  Review of Systems   Constitutional:  Negative for fever.  All other systems reviewed and are negative.      Objective:   BP 110/65   Pulse (!) 53   Ht 5\' 6"  (1.676 m)   Wt 164 lb (74.4 kg)   SpO2 94%   BMI 26.47 kg/m   Vitals:   02/05/23 1309  BP: 110/65  Pulse: (!) 53  Height: 5\' 6"  (1.676 m)  Weight: 164 lb (74.4 kg)  SpO2: 94%  BMI (Calculated): 26.48    Physical Exam Vitals reviewed.  Constitutional:      Appearance: Normal appearance.  HENT:     Head: Normocephalic.     Right Ear: Hearing normal. No drainage. Tympanic membrane is not injected.     Left Ear: Hearing normal. No drainage. There is no impacted cerumen. Tympanic membrane is not injected.     Ears:     Comments: Dull TM    Nose: Nose normal.     Mouth/Throat:     Mouth: Mucous membranes are moist.     Pharynx: No posterior oropharyngeal erythema.  Eyes:     Extraocular Movements: Extraocular movements intact.     Pupils: Pupils are equal, round, and reactive to light.  Cardiovascular:     Rate and Rhythm: Regular rhythm.     Chest Wall: PMI is not displaced.     Pulses: Normal pulses.     Heart sounds: Normal heart sounds. No murmur heard. Pulmonary:     Effort: Pulmonary effort is normal.     Breath sounds: Normal air entry. No rhonchi or rales.  Abdominal:     General: Abdomen is flat. Bowel sounds are normal. There is no distension.     Palpations: Abdomen is soft. There is no hepatomegaly, splenomegaly or mass.     Tenderness: There is no abdominal tenderness.  Musculoskeletal:        General: Normal range of motion.     Cervical back: Normal range of motion and neck supple.     Right lower leg: No edema.     Left lower leg: No edema.  Skin:    General: Skin is warm and dry.  Neurological:     General: No focal deficit present.     Mental Status: He is alert and oriented to person, place, and time.     Cranial Nerves: No cranial nerve deficit.     Motor: No weakness.  Psychiatric:        Mood and  Affect: Mood normal.        Behavior: Behavior normal.  No results found for any visits on 02/05/23.  Recent Results (from the past 2160 hour(Carlous Olivares))  Protime-INR     Status: Abnormal   Collection Time: 11/16/22  2:45 PM  Result Value Ref Range   INR 2.8 (H) 0.9 - 1.2    Comment: Reference interval is for non-anticoagulated patients. Suggested INR therapeutic range for Vitamin K antagonist therapy:    Standard Dose (moderate intensity                   therapeutic range):       2.0 - 3.0    Higher intensity therapeutic range       2.5 - 3.5    Prothrombin Time 27.8 (H) 9.1 - 12.0 sec      Assessment & Plan:  As per problem list.  Problem List Items Addressed This Visit   None Visit Diagnoses     Blocked Eustachian tube, right       Relevant Medications   cetirizine (ZYRTEC ALLERGY) 10 MG tablet   meclizine (ANTIVERT) 25 MG tablet   Seasonal allergic rhinitis due to pollen       Relevant Medications   cetirizine (ZYRTEC ALLERGY) 10 MG tablet       Return if symptoms worsen or fail to improve.   Total time spent: 20 minutes  Luna Fuse, MD  02/05/2023   This document may have been prepared by [facility] Voice Recognition software and as such may include unintentional dictation errors.

## 2023-02-16 ENCOUNTER — Encounter: Payer: Self-pay | Admitting: Cardiovascular Disease

## 2023-02-16 ENCOUNTER — Ambulatory Visit: Payer: Medicare HMO | Admitting: Cardiovascular Disease

## 2023-02-16 VITALS — BP 100/70 | HR 68 | Ht 66.0 in | Wt 171.6 lb

## 2023-02-16 DIAGNOSIS — R5381 Other malaise: Secondary | ICD-10-CM | POA: Diagnosis not present

## 2023-02-16 DIAGNOSIS — E782 Mixed hyperlipidemia: Secondary | ICD-10-CM

## 2023-02-16 DIAGNOSIS — I480 Paroxysmal atrial fibrillation: Secondary | ICD-10-CM | POA: Diagnosis not present

## 2023-02-16 DIAGNOSIS — R5383 Other fatigue: Secondary | ICD-10-CM | POA: Diagnosis not present

## 2023-02-16 DIAGNOSIS — G4733 Obstructive sleep apnea (adult) (pediatric): Secondary | ICD-10-CM | POA: Diagnosis not present

## 2023-02-16 DIAGNOSIS — I10 Essential (primary) hypertension: Secondary | ICD-10-CM

## 2023-02-16 NOTE — Addendum Note (Signed)
Addended by: Adrian Blackwater A on: 02/16/2023 02:37 PM   Modules accepted: Orders

## 2023-02-16 NOTE — Progress Notes (Signed)
Cardiology Office Note   Date:  02/16/2023   ID:  Kyle Santos, DOB [birth year], MRN [id number]  PCP:  Sherron Monday, MD  Cardiologist:  Adrian Blackwater, MD      History of Present Illness: Kyle Santos is a 87 y.o. male who presents for  Chief Complaint  Patient presents with   PPD Reading    2 mo F/U    Balance issues and ringing in ears, that Dr Kinnie Feil treating with meclazine. Has SOB  Shortness of Breath This is a chronic problem. The current episode started more than 1 year ago. The problem has been unchanged.      Past Medical History:  Diagnosis Date   A-fib [facility])    Arthritis    BPH (benign prostatic hyperplasia)    Depression    Heart murmur    HOH (hard of hearing)    Hypercholesteremia    Hypertension    Loss of equilibrium    being evaluated at this time.   Pneumonia    history of   Sleep apnea      Past Surgical History:  Procedure Laterality Date   CATARACT EXTRACTION W/PHACO Right 01/09/2017   Procedure: CATARACT EXTRACTION PHACO AND INTRAOCULAR LENS PLACEMENT (IOC);  Surgeon: Galen Manila, MD;  Location: [facility] ORS;  Service: Ophthalmology;  Laterality: Right;  [place] 01:06 AP% 19.7 CDE 13.18 Fluid pack lot # [id number]   CATARACT EXTRACTION W/PHACO Left 08/07/2017   Procedure: CATARACT EXTRACTION PHACO AND INTRAOCULAR LENS PLACEMENT (IOC)-LEFT;  Surgeon: Galen Manila, MD;  Location: [facility] ORS;  Service: Ophthalmology;  Laterality: Left;  [place]  00:43 AP% 15.0 CDE 6.50 Fluid pack lot # [id number]   COLONOSCOPY     JOINT REPLACEMENT     knee   JOINT REPLACEMENT     shoulder     Current Outpatient Medications  Medication Sig Dispense Refill   atorvastatin (LIPITOR) 80 MG tablet Take 1 tablet (80 mg total) by mouth daily. 90 tablet 2   B Complex Vitamins (B COMPLEX-B12 PO) Take 1 tablet by mouth daily.     C[place] 450 MG CAPS Take 450 mg by mouth 2 (two) times daily.     celecoxib (CELEBREX) 200 MG capsule TAKE 1 CAPSULE  BY MOUTH TWICE DAILY AS NEEDED FOR BACK PAIN 60 capsule 2   cetirizine (ZYRTEC ALLERGY) 10 MG tablet Take 1 tablet (10 mg total) by mouth daily. 30 tablet 2   Cholecalciferol (VITAMIN D3) 1000 units CAPS Take 1,000 Units by mouth daily at 3 pm.     fluticasone (FLONASE) 50 MCG/ACT nasal spray Place 1 spray into both nostrils daily. 16 mL 2   losartan (COZAAR) 25 MG tablet Take 1 tablet (25 mg total) by mouth daily. 30 tablet 11   meclizine (ANTIVERT) 25 MG tablet Take 1 tablet (25 mg total) by mouth 3 (three) times daily as needed for dizziness. 30 tablet 1   Melatonin 5 MG TABS Take 5 mg by mouth at bedtime.      tamsulosin (FLOMAX) 0.4 MG CAPS capsule Take 1 capsule (0.4 mg total) by mouth daily. 90 capsule 3   traZODone (DESYREL) 50 MG tablet Take 25 mg by mouth at bedtime.     warfarin (COUMADIN) 7.5 MG tablet Take 1 tablet (7.5 mg total) by mouth daily. 90 tablet 3   No current facility-administered medications for this visit.    Allergies:   Patient has no known allergies.    Social History:  reports that he quit smoking about 59 years ago. His smoking use included cigarettes. He has a 3.50 pack-year smoking history. He has never used smokeless tobacco. He reports current alcohol use. He reports that he does not use drugs.   Family History:  family history includes Heart disease in his father and mother.    ROS:     Review of Systems  Constitutional: Negative.   HENT: Negative.    Eyes: Negative.   Respiratory:  Positive for shortness of breath.   Gastrointestinal: Negative.   Genitourinary: Negative.   Musculoskeletal: Negative.   Skin: Negative.   Neurological: Negative.   Endo/Heme/Allergies: Negative.   Psychiatric/Behavioral: Negative.    All other systems reviewed and are negative.     All other systems are reviewed and negative.    PHYSICAL EXAM: VS:  BP 100/70   Pulse 68   Ht 5\' 6"  (1.676 m)   Wt 171 lb 9.6 oz (77.8 kg)   SpO2 94%   BMI 27.70 kg/m  ,  BMI Body mass index is 27.7 kg/m. Last weight:  Wt Readings from Last 3 Encounters:  02/16/23 171 lb 9.6 oz (77.8 kg)  02/05/23 164 lb (74.4 kg)  01/08/23 172 lb 9.6 oz (78.3 kg)     Physical Exam Vitals reviewed.  Constitutional:      Appearance: Normal appearance. He is normal weight.  HENT:     Head: Normocephalic.     Nose: Nose normal.     Mouth/Throat:     Mouth: Mucous membranes are moist.  Eyes:     Pupils: Pupils are equal, round, and reactive to light.  Cardiovascular:     Rate and Rhythm: Normal rate and regular rhythm.     Pulses: Normal pulses.     Heart sounds: Normal heart sounds.  Pulmonary:     Effort: Pulmonary effort is normal.  Abdominal:     General: Abdomen is flat. Bowel sounds are normal.  Musculoskeletal:        General: Normal range of motion.     Cervical back: Normal range of motion.  Skin:    General: Skin is warm.  Neurological:     General: No focal deficit present.     Mental Status: He is alert.  Psychiatric:        Mood and Affect: Mood normal.       EKG:   Recent Labs: No results found for requested labs within last 365 days.    Lipid Panel No results found for: "CHOL", "TRIG", "HDL", "CHOLHDL", "VLDL", "LDLCALC", "LDLDIRECT"    Other studies Reviewed: Additional studies/ records that were reviewed today include:  Review of the above records demonstrates:       No data to display            ASSESSMENT AND PLAN:    ICD-10-CM   1. Paroxysmal atrial fibrillation (HCC)  I48.0     2. Essential hypertension  I10    stable    3. OSA on CPAP  G47.33    not wearing cpap    4. Malaise and fatigue  R53.81    R53.83    probably lack of quality sleep as not wearing cpap    5. Mixed hyperlipidemia  E78.2        Problem List Items Addressed This Visit       Cardiovascular and Mediastinum   Atrial fibrillation [facility]) - Primary   Essential hypertension     Respiratory   OSA  on CPAP     Other   Malaise and  fatigue   Mixed hyperlipidemia       Disposition:   Return in about 2 months (around 04/18/2023).    Total time spent: 30 minutes  Signed,  Adrian Blackwater, MD  02/16/2023 10:59 AM    [facility]

## 2023-03-05 ENCOUNTER — Other Ambulatory Visit: Payer: Self-pay | Admitting: Internal Medicine

## 2023-04-06 ENCOUNTER — Other Ambulatory Visit: Payer: Medicare HMO

## 2023-04-06 ENCOUNTER — Other Ambulatory Visit: Payer: Self-pay | Admitting: Cardiovascular Disease

## 2023-04-06 ENCOUNTER — Other Ambulatory Visit: Payer: Self-pay

## 2023-04-06 DIAGNOSIS — E782 Mixed hyperlipidemia: Secondary | ICD-10-CM | POA: Diagnosis not present

## 2023-04-06 DIAGNOSIS — I4891 Unspecified atrial fibrillation: Secondary | ICD-10-CM | POA: Diagnosis not present

## 2023-04-06 DIAGNOSIS — I10 Essential (primary) hypertension: Secondary | ICD-10-CM | POA: Diagnosis not present

## 2023-04-06 DIAGNOSIS — E559 Vitamin D deficiency, unspecified: Secondary | ICD-10-CM | POA: Diagnosis not present

## 2023-04-07 LAB — COMPREHENSIVE METABOLIC PANEL
ALT: 24 IU/L (ref 0–44)
AST: 30 IU/L (ref 0–40)
Albumin: 4.1 g/dL (ref 3.7–4.7)
Alkaline Phosphatase: 77 IU/L (ref 44–121)
BUN/Creatinine Ratio: 18 (ref 10–24)
BUN: 26 mg/dL (ref 8–27)
Bilirubin Total: 1 mg/dL (ref 0.0–1.2)
CO2: 23 mmol/L (ref 20–29)
Calcium: 9.5 mg/dL (ref 8.6–10.2)
Chloride: 108 mmol/L — ABNORMAL HIGH (ref 96–106)
Creatinine, Ser: 1.44 mg/dL — ABNORMAL HIGH (ref 0.76–1.27)
Globulin, Total: 2.9 g/dL (ref 1.5–4.5)
Glucose: 124 mg/dL — ABNORMAL HIGH (ref 70–99)
Potassium: 4.7 mmol/L (ref 3.5–5.2)
Sodium: 143 mmol/L (ref 134–144)
Total Protein: 7 g/dL (ref 6.0–8.5)
eGFR: 48 mL/min/{1.73_m2} — ABNORMAL LOW (ref >59)

## 2023-04-07 LAB — CBC WITH DIFF/PLATELET
Basophils Absolute: 0 10*3/uL (ref 0.0–0.2)
Basos: 1 %
EOS (ABSOLUTE): 0.2 10*3/uL (ref 0.0–0.4)
Eos: 4 %
Hematocrit: 41.5 % (ref 37.5–51.0)
Hemoglobin: 13.4 g/dL (ref 13.0–17.7)
Immature Grans (Abs): 0 10*3/uL (ref 0.0–0.1)
Immature Granulocytes: 0 %
Lymphocytes Absolute: 1.7 10*3/uL (ref 0.7–3.1)
Lymphs: 28 %
MCH: 31.1 pg (ref 26.6–33.0)
MCHC: 32.3 g/dL (ref 31.5–35.7)
MCV: 96 fL (ref 79–97)
Monocytes Absolute: 0.6 10*3/uL (ref 0.1–0.9)
Monocytes: 9 %
Neutrophils Absolute: 3.6 10*3/uL (ref 1.4–7.0)
Neutrophils: 58 %
Platelets: 149 10*3/uL — ABNORMAL LOW (ref 150–450)
RBC: 4.31 x10E6/uL (ref 4.14–5.80)
RDW: 12.9 % (ref 11.6–15.4)
WBC: 6.1 10*3/uL (ref 3.4–10.8)

## 2023-04-07 LAB — LIPID PANEL
Chol/HDL Ratio: 3.9 ratio (ref 0.0–5.0)
Cholesterol, Total: 104 mg/dL (ref 100–199)
HDL: 27 mg/dL — ABNORMAL LOW (ref >39)
LDL Chol Calc (NIH): 53 mg/dL (ref 0–99)
Triglycerides: 138 mg/dL (ref 0–149)
VLDL Cholesterol Cal: 24 mg/dL (ref 5–40)

## 2023-04-07 LAB — PROTIME-INR
INR: 2.8 — ABNORMAL HIGH (ref 0.9–1.2)
Prothrombin Time: 29.3 s — ABNORMAL HIGH (ref 9.1–12.0)

## 2023-04-07 LAB — VITAMIN D 25 HYDROXY (VIT D DEFICIENCY, FRACTURES): Vit D, 25-Hydroxy: 58.1 ng/mL (ref 30.0–100.0)

## 2023-04-09 ENCOUNTER — Ambulatory Visit: Payer: Medicare HMO | Admitting: Internal Medicine

## 2023-04-10 ENCOUNTER — Ambulatory Visit: Payer: Medicare HMO | Admitting: Internal Medicine

## 2023-04-10 VITALS — BP 130/90 | HR 59 | Ht 66.0 in | Wt 170.6 lb

## 2023-04-10 DIAGNOSIS — R7303 Prediabetes: Secondary | ICD-10-CM

## 2023-04-10 DIAGNOSIS — N1831 Chronic kidney disease, stage 3a: Secondary | ICD-10-CM

## 2023-04-10 DIAGNOSIS — E782 Mixed hyperlipidemia: Secondary | ICD-10-CM

## 2023-04-10 DIAGNOSIS — D696 Thrombocytopenia, unspecified: Secondary | ICD-10-CM

## 2023-04-10 DIAGNOSIS — J301 Allergic rhinitis due to pollen: Secondary | ICD-10-CM | POA: Diagnosis not present

## 2023-04-10 DIAGNOSIS — I10 Essential (primary) hypertension: Secondary | ICD-10-CM | POA: Diagnosis not present

## 2023-04-10 MED ORDER — FLUTICASONE PROPIONATE 50 MCG/ACT NA SUSP
1.0000 | Freq: Every day | NASAL | 2 refills | Status: DC
Start: 2023-04-10 — End: 2023-10-16

## 2023-04-10 NOTE — Progress Notes (Signed)
Established Patient Office Visit  Subjective:  Patient ID: Kyle Santos, male    DOB: Jan 14, 1936  Age: 87 y.o. MRN: [id number]  Chief Complaint  Patient presents with   Follow-up    3 MO F/U    No new complaints, here for lab review and medication refills. LDL and TC well controlled on lab review. Triglycerides also satisfactory. CMP notable for drop in gfr and elevated cr with elevated fasting glucose to which he admits dietary indiscretion.     No other concerns at this time.   Past Medical History:  Diagnosis Date   A-fib [facility])    Arthritis    BPH (benign prostatic hyperplasia)    Depression    Heart murmur    HOH (hard of hearing)    Hypercholesteremia    Hypertension    Loss of equilibrium    being evaluated at this time.   Pneumonia    history of   Sleep apnea     Past Surgical History:  Procedure Laterality Date   CATARACT EXTRACTION W/PHACO Right 01/09/2017   Procedure: CATARACT EXTRACTION PHACO AND INTRAOCULAR LENS PLACEMENT (IOC);  Surgeon: Galen Manila, MD;  Location: [facility] ORS;  Service: Ophthalmology;  Laterality: Right;  [place] 01:06 AP% 19.7 CDE 13.18 Fluid pack lot # [id number]   CATARACT EXTRACTION W/PHACO Left 08/07/2017   Procedure: CATARACT EXTRACTION PHACO AND INTRAOCULAR LENS PLACEMENT (IOC)-LEFT;  Surgeon: Galen Manila, MD;  Location: [facility] ORS;  Service: Ophthalmology;  Laterality: Left;  [place]  00:43 AP% 15.0 CDE 6.50 Fluid pack lot # [id number]   COLONOSCOPY     JOINT REPLACEMENT     knee   JOINT REPLACEMENT     shoulder    Social History   Socioeconomic History   Marital status: Married    Spouse name: Bonita Quin   Number of children: 3   Years of education: College Degree   Highest education level: Bachelor'Ronnisha Felber degree (e.g., BA, AB, BS)  Occupational History   Occupation: Retired  Tobacco Use   Smoking status: Former    Current packs/day: 0.00    Average packs/day: 0.5 packs/day for 7.0 years (3.5 ttl pk-yrs)    Types: Cigarettes     Start date: 08/21/1956    Quit date: 08/22/1963    Years since quitting: 59.6   Smokeless tobacco: Never  Vaping Use   Vaping status: Never Used  Substance and Sexual Activity   Alcohol use: Yes    Alcohol/week: 0.0 standard drinks of alcohol    Comment: occasional   Drug use: No   Sexual activity: Not Currently    Birth control/protection: None  Other Topics Concern   Not on file  Social History Narrative   Drinks about 2 cups of caffeine.   Social Determinants of Health   Financial Resource Strain: Low Risk  (06/01/2021)   Received from [facility], [facility]   Overall Financial Resource Strain (CARDIA)    Difficulty of Paying Living Expenses: Not hard at all  Recent Concern: Financial Resource Strain - Medium Risk (05/13/2021)   Overall Financial Resource Strain (CARDIA)    Difficulty of Paying Living Expenses: Somewhat hard  Food Insecurity: No Food Insecurity (06/01/2021)   Received from [facility], [facility]   Hunger Vital Sign    Worried About Running Out of Food in the Last Year: Never true    Ran Out of Food in the Last Year: Never true  Transportation Needs: No Transportation Needs (06/01/2021)  Received from [facility], [facility]   [facility] - Transportation    Lack of Transportation (Medical): No    Lack of Transportation (Non-Medical): No  Physical Activity: Sufficiently Active (05/13/2021)   Exercise Vital Sign    Days of Exercise per Week: 3 days    Minutes of Exercise per Session: 150+ min  Stress: Not on file  Social Connections: Socially Integrated (05/13/2021)   Social Connection and Isolation Panel [NHANES]    Frequency of Communication with Friends and Family: More than three times a week    Frequency of Social Gatherings with Friends and Family: Once a week    Attends Religious Services: More than 4 times per year    Active Member of [employer] or Organizations: Yes    Attends Engineer, structural: More than 4 times  per year    Marital Status: Married  Catering manager Violence: Not At Risk (05/13/2021)   Humiliation, Afraid, Rape, and Kick questionnaire    Fear of Current or Ex-Partner: No    Emotionally Abused: No    Physically Abused: No    Sexually Abused: No    Family History  Problem Relation Age of Onset   Heart disease Mother    Heart disease Father     No Known Allergies  Review of Systems  Constitutional:  Negative for fever.  All other systems reviewed and are negative.      Objective:   BP (!) 130/90   Pulse (!) 59   Ht 5\' 6"  (1.676 m)   Wt 170 lb 9.6 oz (77.4 kg)   SpO2 96%   BMI 27.54 kg/m   Vitals:   04/10/23 1144  BP: (!) 130/90  Pulse: (!) 59  Height: 5\' 6"  (1.676 m)  Weight: 170 lb 9.6 oz (77.4 kg)  SpO2: 96%  BMI (Calculated): 27.55    Physical Exam Vitals reviewed.  Constitutional:      Appearance: Normal appearance.  HENT:     Head: Normocephalic.     Right Ear: Hearing normal. No drainage. Tympanic membrane is not injected.     Left Ear: Hearing normal. No drainage. There is no impacted cerumen. Tympanic membrane is not injected.     Ears:     Comments: Dull TM    Nose: Nose normal.     Mouth/Throat:     Mouth: Mucous membranes are moist.     Pharynx: No posterior oropharyngeal erythema.  Eyes:     Extraocular Movements: Extraocular movements intact.     Pupils: Pupils are equal, round, and reactive to light.  Cardiovascular:     Rate and Rhythm: Regular rhythm.     Chest Wall: PMI is not displaced.     Pulses: Normal pulses.     Heart sounds: Normal heart sounds. No murmur heard. Pulmonary:     Effort: Pulmonary effort is normal.     Breath sounds: Normal air entry. No rhonchi or rales.  Abdominal:     General: Abdomen is flat. Bowel sounds are normal. There is no distension.     Palpations: Abdomen is soft. There is no hepatomegaly, splenomegaly or mass.     Tenderness: There is no abdominal tenderness.  Musculoskeletal:         General: Normal range of motion.     Cervical back: Normal range of motion and neck supple.     Right lower leg: No edema.     Left lower leg: No edema.  Skin:  General: Skin is warm and dry.  Neurological:     General: No focal deficit present.     Mental Status: He is alert and oriented to person, place, and time.     Cranial Nerves: No cranial nerve deficit.     Motor: No weakness.  Psychiatric:        Mood and Affect: Mood normal.        Behavior: Behavior normal.      No results found for any visits on 04/10/23.  Recent Results (from the past 2160 hour(Caroly Purewal))  Vitamin D (25 hydroxy)     Status: None   Collection Time: 04/06/23 10:07 AM  Result Value Ref Range   Vit D, 25-Hydroxy 58.1 30.0 - 100.0 ng/mL    Comment: Vitamin D deficiency has been defined by the Institute of Medicine and an Endocrine Society practice guideline as a level of serum 25-OH vitamin D less than 20 ng/mL (1,2). The Endocrine Society went on to further define vitamin D insufficiency as a level between 21 and 29 ng/mL (2). 1. IOM (Institute of Medicine). 2010. Dietary reference    intakes for calcium and D. [place]: The    [employer]. 2. Holick MF, Binkley [place], Bischoff-Ferrari HA, et al.    Evaluation, treatment, and prevention of vitamin D    deficiency: an Endocrine Society clinical practice    guideline. JCEM. 2011 Jul; 96(7):1911-30.   CBC With Diff/Platelet     Status: Abnormal   Collection Time: 04/06/23 10:07 AM  Result Value Ref Range   WBC 6.1 3.4 - 10.8 x10E3/uL   RBC 4.31 4.14 - 5.80 x10E6/uL   Hemoglobin 13.4 13.0 - 17.7 g/dL   Hematocrit 09.8 11.9 - 51.0 %   MCV 96 79 - 97 fL   MCH 31.1 26.6 - 33.0 pg   MCHC 32.3 31.5 - 35.7 g/dL   RDW 14.7 82.9 - 56.2 %   Platelets 149 (L) 150 - 450 x10E3/uL   Neutrophils 58 Not Estab. %   Lymphs 28 Not Estab. %   Monocytes 9 Not Estab. %   Eos 4 Not Estab. %   Basos 1 Not Estab. %   Neutrophils Absolute 3.6 1.4 - 7.0  x10E3/uL   Lymphocytes Absolute 1.7 0.7 - 3.1 x10E3/uL   Monocytes Absolute 0.6 0.1 - 0.9 x10E3/uL   EOS (ABSOLUTE) 0.2 0.0 - 0.4 x10E3/uL   Basophils Absolute 0.0 0.0 - 0.2 x10E3/uL   Immature Granulocytes 0 Not Estab. %   Immature Grans (Abs) 0.0 0.0 - 0.1 x10E3/uL  Comprehensive metabolic panel     Status: Abnormal   Collection Time: 04/06/23 10:07 AM  Result Value Ref Range   Glucose 124 (H) 70 - 99 mg/dL   BUN 26 8 - 27 mg/dL   Creatinine, Ser 1.30 (H) 0.76 - 1.27 mg/dL   eGFR 48 (L) >86 VH/QIO/9.62   BUN/Creatinine Ratio 18 10 - 24   Sodium 143 134 - 144 mmol/L   Potassium 4.7 3.5 - 5.2 mmol/L   Chloride 108 (H) 96 - 106 mmol/L   CO2 23 20 - 29 mmol/L   Calcium 9.5 8.6 - 10.2 mg/dL   Total Protein 7.0 6.0 - 8.5 g/dL   Albumin 4.1 3.7 - 4.7 g/dL   Globulin, Total 2.9 1.5 - 4.5 g/dL   Bilirubin Total 1.0 0.0 - 1.2 mg/dL   Alkaline Phosphatase 77 44 - 121 IU/L   AST 30 0 - 40 IU/L   ALT 24 0 -  44 IU/L  Lipid panel     Status: Abnormal   Collection Time: 04/06/23 10:07 AM  Result Value Ref Range   Cholesterol, Total 104 100 - 199 mg/dL   Triglycerides 254 0 - 149 mg/dL   HDL 27 (L) >27 mg/dL   VLDL Cholesterol Cal 24 5 - 40 mg/dL   LDL Chol Calc (NIH) 53 0 - 99 mg/dL   Chol/HDL Ratio 3.9 0.0 - 5.0 ratio    Comment:                                   T. Chol/HDL Ratio                                             Men  Women                               1/2 Avg.Risk  3.4    3.3                                   Avg.Risk  5.0    4.4                                2X Avg.Risk  9.6    7.1                                3X Avg.Risk 23.4   11.0   Protime-INR     Status: Abnormal   Collection Time: 04/06/23 10:54 AM  Result Value Ref Range   INR 2.8 (H) 0.9 - 1.2    Comment: Reference interval is for non-anticoagulated patients. Suggested INR therapeutic range for Vitamin K antagonist therapy:    Standard Dose (moderate intensity                   therapeutic range):        2.0 - 3.0    Higher intensity therapeutic range       2.5 - 3.5    Prothrombin Time 29.3 (H) 9.1 - 12.0 sec      Assessment & Plan:   Problem List Items Addressed This Visit   None   No follow-ups on file.   Total time spent: 20 minutes  Luna Fuse, MD  04/10/2023   This document may have been prepared by [facility] Voice Recognition software and as such may include unintentional dictation errors.

## 2023-04-19 ENCOUNTER — Other Ambulatory Visit: Payer: Self-pay | Admitting: Internal Medicine

## 2023-04-19 ENCOUNTER — Ambulatory Visit: Payer: Medicare HMO | Admitting: Cardiovascular Disease

## 2023-04-19 DIAGNOSIS — I10 Essential (primary) hypertension: Secondary | ICD-10-CM

## 2023-04-25 ENCOUNTER — Encounter: Payer: Self-pay | Admitting: Cardiology

## 2023-04-25 ENCOUNTER — Ambulatory Visit: Payer: Medicare HMO | Admitting: Cardiology

## 2023-04-25 VITALS — BP 124/63 | HR 72 | Ht 66.0 in | Wt 169.4 lb

## 2023-04-25 DIAGNOSIS — G4733 Obstructive sleep apnea (adult) (pediatric): Secondary | ICD-10-CM

## 2023-04-25 DIAGNOSIS — E782 Mixed hyperlipidemia: Secondary | ICD-10-CM | POA: Diagnosis not present

## 2023-04-25 DIAGNOSIS — I480 Paroxysmal atrial fibrillation: Secondary | ICD-10-CM | POA: Diagnosis not present

## 2023-04-25 DIAGNOSIS — I2510 Atherosclerotic heart disease of native coronary artery without angina pectoris: Secondary | ICD-10-CM | POA: Diagnosis not present

## 2023-04-25 DIAGNOSIS — I10 Essential (primary) hypertension: Secondary | ICD-10-CM | POA: Diagnosis not present

## 2023-04-25 MED ORDER — RANOLAZINE ER 500 MG PO TB12: 500.0000 mg | ORAL_TABLET | Freq: Two times a day (BID) | ORAL | 3 refills | Status: DC

## 2023-04-25 NOTE — Assessment & Plan Note (Signed)
Start ranolazine. Return in 2 weeks. If pain continues, will order a stress test. Discussed ordering a CCTA, patient's kidney function decreased.

## 2023-04-25 NOTE — Progress Notes (Signed)
Cardiology Office Note   Date:  04/25/2023   ID:  Kyle Santos, DOB 1935-12-18, MRN [id number]  PCP:  Sherron Monday, MD  Cardiologist:  Marisue Ivan, NP      History of Present Illness: Kyle Santos is a 87 y.o. male who presents for  Chief Complaint  Patient presents with   Follow-up    Patient in office for routine cardiac exam. Denies chest pain, shortness of breath, dizziness, lower extremity edema. Patient reports feeling fatigued, continues to have balance problems. Patient reports 2 episodes of epigastric pain that radiated under both breasts, states he became short of breath. Once while sitting at a desk, second time while laying in bed. CCTA 12/2019 Calcium score: 93.0, RCA and LCX have no significant disease. LAD is severely calcified with moderate distal disease. Patient did not tolerate isosorbide in the past, due to headache. Ranolazine was started. Patient unsure if he is taking, not on medication list. Will restart ranolazine.       Past Medical History:  Diagnosis Date   A-fib [facility])    Arthritis    BPH (benign prostatic hyperplasia)    Depression    Heart murmur    HOH (hard of hearing)    Hypercholesteremia    Hypertension    Loss of equilibrium    being evaluated at this time.   Pneumonia    history of   Sleep apnea      Past Surgical History:  Procedure Laterality Date   CATARACT EXTRACTION W/PHACO Right 01/09/2017   Procedure: CATARACT EXTRACTION PHACO AND INTRAOCULAR LENS PLACEMENT (IOC);  Surgeon: Galen Manila, MD;  Location: [facility] ORS;  Service: Ophthalmology;  Laterality: Right;  [place] 01:06 AP% 19.7 CDE 13.18 Fluid pack lot # [id number]   CATARACT EXTRACTION W/PHACO Left 08/07/2017   Procedure: CATARACT EXTRACTION PHACO AND INTRAOCULAR LENS PLACEMENT (IOC)-LEFT;  Surgeon: Galen Manila, MD;  Location: [facility] ORS;  Service: Ophthalmology;  Laterality: Left;  [place]  00:43 AP% 15.0 CDE 6.50 Fluid pack lot # [id number]   COLONOSCOPY      JOINT REPLACEMENT     knee   JOINT REPLACEMENT     shoulder     Current Outpatient Medications  Medication Sig Dispense Refill   ranolazine (RANEXA) 500 MG 12 hr tablet Take 1 tablet (500 mg total) by mouth 2 (two) times daily. 60 tablet 3   amLODipine-olmesartan (AZOR) 5-20 MG tablet TAKE 1 TABLET EVERY DAY 90 tablet 1   atorvastatin (LIPITOR) 80 MG tablet Take 1 tablet (80 mg total) by mouth daily. 90 tablet 2   B Complex Vitamins (B COMPLEX-B12 PO) Take 1 tablet by mouth daily.     Cascara Sagrada 450 MG CAPS Take 450 mg by mouth 2 (two) times daily.     Cholecalciferol (VITAMIN D3) 1000 units CAPS Take 1,000 Units by mouth daily at 3 pm.     fluticasone (FLONASE) 50 MCG/ACT nasal spray Place 1 spray into both nostrils daily. 16 mL 2   losartan (COZAAR) 25 MG tablet Take 1 tablet (25 mg total) by mouth daily. 30 tablet 11   meclizine (ANTIVERT) 25 MG tablet Take 1 tablet (25 mg total) by mouth 3 (three) times daily as needed for dizziness. 30 tablet 1   Melatonin 5 MG TABS Take 5 mg by mouth at bedtime.      tamsulosin (FLOMAX) 0.4 MG CAPS capsule Take 1 capsule (0.4 mg total) by mouth daily. 90 capsule 3   traZODone (DESYREL)  50 MG tablet TAKE 1 TABLET AT BEDTIME 90 tablet 1   warfarin (COUMADIN) 7.5 MG tablet Take 1 tablet (7.5 mg total) by mouth daily. 90 tablet 3   No current facility-administered medications for this visit.    Allergies:   Patient has no known allergies.    Social History:   reports that he quit smoking about 59 years ago. His smoking use included cigarettes. He started smoking about 66 years ago. He has a 3.5 pack-year smoking history. He has never used smokeless tobacco. He reports current alcohol use. He reports that he does not use drugs.   Family History:  family history includes Heart disease in his father and mother.    ROS:     Review of Systems  Constitutional:  Positive for malaise/fatigue.  HENT: Negative.    Eyes: Negative.   Respiratory:   Positive for shortness of breath.   Cardiovascular: Negative.   Gastrointestinal: Negative.   Genitourinary: Negative.   Musculoskeletal: Negative.   Skin: Negative.   Neurological: Negative.   Endo/Heme/Allergies: Negative.   Psychiatric/Behavioral: Negative.    All other systems reviewed and are negative.     All other systems are reviewed and negative.    PHYSICAL EXAM: VS:  BP 124/63   Pulse 72   Ht 5\' 6"  (1.676 m)   Wt 169 lb 6.4 oz (76.8 kg)   SpO2 96%   BMI 27.34 kg/m  , BMI Body mass index is 27.34 kg/m. Last weight:  Wt Readings from Last 3 Encounters:  04/25/23 169 lb 6.4 oz (76.8 kg)  04/10/23 170 lb 9.6 oz (77.4 kg)  02/16/23 171 lb 9.6 oz (77.8 kg)     Physical Exam Vitals and nursing note reviewed.  Constitutional:      Appearance: Normal appearance. He is normal weight.  HENT:     Head: Normocephalic and atraumatic.     Nose: Nose normal.     Mouth/Throat:     Mouth: Mucous membranes are moist.     Pharynx: Oropharynx is clear.  Eyes:     Extraocular Movements: Extraocular movements intact.     Conjunctiva/sclera: Conjunctivae normal.     Pupils: Pupils are equal, round, and reactive to light.  Cardiovascular:     Rate and Rhythm: Normal rate and regular rhythm.     Pulses: Normal pulses.     Heart sounds: Normal heart sounds.  Pulmonary:     Effort: Pulmonary effort is normal.     Breath sounds: Normal breath sounds.  Abdominal:     General: Abdomen is flat. Bowel sounds are normal.     Palpations: Abdomen is soft.  Musculoskeletal:        General: Normal range of motion.     Cervical back: Normal range of motion.  Skin:    General: Skin is warm and dry.  Neurological:     General: No focal deficit present.     Mental Status: He is alert and oriented to person, place, and time. Mental status is at baseline.  Psychiatric:        Mood and Affect: Mood normal.        Behavior: Behavior normal.        Thought Content: Thought content  normal.        Judgment: Judgment normal.       EKG:   Recent Labs: 04/06/2023: ALT 24; BUN 26; Creatinine, Ser 1.44; Hemoglobin 13.4; Platelets 149; Potassium 4.7; Sodium 143    Lipid Panel  Component Value Date/Time   CHOL 104 04/06/2023 1007   TRIG 138 04/06/2023 1007   HDL 27 (L) 04/06/2023 1007   CHOLHDL 3.9 04/06/2023 1007   LDLCALC 53 04/06/2023 1007      Other studies Reviewed: Patient: [id number] - Harlan Stains DOB:  08-08-1936  Date:  08/31/2022 11:30 Provider: Adrian Blackwater MD Encounter: ECHO   TESTS  Imaging: Echocardiogram:  An echocardiogram in (2-d) mode was performed and in Doppler mode with color flow velocity mapping was performed. The aortic valve cusps are abnormal 2.1 cm, flow velocity 1.27  m/s, and systolic calculated mean flow gradient 4 mmHg. Mitral valve diastolic peak flow velocity E .40    m/s and E/A ratio 0.4. Aortic root diameter 4.1    cm. The LVOT internal diameter 3.5 cm and flow velocity was abnormal .654  m/s. LV systolic dimension 2.17  cm, diastolic 3.63  cm, posterior wall thickness 1.37   cm, fractional shortening 40.2%, and EF 71.7  %. IVS thickness 1.71   cm. LA dimension 4.8 cm. Mitral Valve has Mild Regurgitation. Tricuspid Valve has Mild Regurgitation. Aortic Valve has Mild Regurgitation.     ASSESSMENT  Technically adequate study.  Normal chamber sizes.  Normal left ventricular systolic function.  Mild left ventricular hypertrophy with GRADE 1 (relaxation abnormality) diastolic dysfunction.  Normal right ventricular systolic function.  Normal right ventricular diastolic function.  Normal left ventricular wall motion.  Normal right ventricular wall motion.  Mild tricuspid regurgitation.  Normal pulmonary artery pressure.  Mild mitral regurgitation.  Fibrocalcified aortic valve with Mild aortic regurgitation.  No pericardial effusion.  Mildly dilated Left atrium  Moderate LVH.  Adrian Blackwater MD  Electronically  signed by: Adrian Blackwater     Date: 08/31/2022 14:16  Patient: [id number] - Harlan Stains DOB:  07-30-1936  Date:  08/28/2022 14:38 Provider: Adrian Blackwater MD Encounter: [facility]  [facility] [address] [place], [place] [address] [phone] STUDY:  Gated Stress / Rest Myocardial Perfusion Imaging Tomographic (SPECT) Including attenuation correction Wall Motion, Left Ventricular Ejection Fraction By Gated Technique.Persantine Stress Test. SEX: Male   WEIGHT: 170 lbs   HEIGHT: 67 in      ARMS UP: YES/NO                                                                                                                                                                                REFERRING PHYSICIAN:  Dr.Shaukat Welton Flakes  INDICATION FOR STUDY: CAD                                                                                                                                                                                                                     TECHNIQUE:  Approximately 20 minutes following the intravenous administration of 9.7 mCi of Tc-33m Sestamibi after stress testing in a reclined supine position with arms above their head if able to do so, gated SPECT imaging of the heart was performed. After about a 2hr break, the patient was injected intravenously with 32.7 mCi of Tc-2m Sestamibi.  Approximately 45 minutes later in the same position as stress imaging SPECT rest imaging of the heart was performed.  STRESS BY:  Adrian Blackwater, MD PROTOCOL:  Persantine    DOSE ADMIN: 8.8 CC   ROUTE OF ADMINISTRATION: IV                                                                            MAX PRED HR: 136                     85%: 116               75%:  102                                                                                                                   RESTING BP:  140/80   RESTING HR: 68  PEAK BP: 98/60  PEAK HR:  87  EXERCISE DURATION:    4 min injection                                            REASON FOR TEST TERMINATION:    Protocol end                                                                                                                              SYMPTOMS:   None                                                                                                                                                                                                          EKG RESULTS: NSR. 68/min. Low voltage, no significant ST changes with persantine.                                                              IMAGE QUALITY:  Good  PERFUSION/WALL MOTION FINDINGS: EF = 86%. Small mild basal,mid, and apical inferior wall defects, normal wall motion.                                                                           IMPRESSION:  Ischemia in the RCA territory with normal LVEF.                                                                                                                                                                                                                                                                                        Adrian Blackwater, MD Stress Interpreting Physician / Nuclear Interpreting Physician  Adrian Blackwater MD  Electronically signed by: Adrian Blackwater     Date: 09/01/2022 11:00   ASSESSMENT AND PLAN:    ICD-10-CM    1. Paroxysmal atrial fibrillation (HCC)  I48.0     2. Essential hypertension  I10     3. Mixed hyperlipidemia  E78.2     4. OSA on CPAP  G47.33     5. Coronary artery disease involving native coronary artery of native heart, unspecified whether angina present  I25.10        Problem List Items Addressed This Visit       Cardiovascular and Mediastinum   Atrial fibrillation (HCC) - Primary   Relevant Medications   ranolazine (RANEXA) 500 MG 12 hr tablet   Essential hypertension    Well controlled. Continue current regimen.       Relevant Medications   ranolazine (RANEXA) 500 MG 12 hr tablet   Coronary artery disease involving native coronary artery of native heart    Start ranolazine. Return in 2 weeks. If pain continues, will order a stress test. Discussed ordering a CCTA, patient's kidney function decreased.       Relevant Medications   ranolazine (RANEXA) 500 MG 12 hr tablet  Respiratory   OSA on CPAP    Not currently using a cpap.        Other   Mixed hyperlipidemia   Relevant Medications   ranolazine (RANEXA) 500 MG 12 hr tablet     Disposition:   Return in about 2 weeks (around 05/09/2023).    Total time spent: 25 minutes  Signed,  [employer], NP  04/25/2023 10:51 AM    [facility]

## 2023-04-25 NOTE — Assessment & Plan Note (Signed)
Not currently using a cpap.

## 2023-04-25 NOTE — Assessment & Plan Note (Signed)
Well-controlled.  Continue current regimen. 

## 2023-05-03 DIAGNOSIS — H6091 Unspecified otitis externa, right ear: Secondary | ICD-10-CM | POA: Diagnosis not present

## 2023-05-10 ENCOUNTER — Encounter: Payer: Self-pay | Admitting: Cardiovascular Disease

## 2023-05-10 ENCOUNTER — Telehealth: Payer: Self-pay

## 2023-05-10 ENCOUNTER — Ambulatory Visit: Payer: Medicare HMO | Admitting: Cardiovascular Disease

## 2023-05-10 VITALS — BP 80/55 | HR 63 | Ht 66.0 in | Wt 170.8 lb

## 2023-05-10 DIAGNOSIS — I2510 Atherosclerotic heart disease of native coronary artery without angina pectoris: Secondary | ICD-10-CM | POA: Diagnosis not present

## 2023-05-10 DIAGNOSIS — I952 Hypotension due to drugs: Secondary | ICD-10-CM

## 2023-05-10 DIAGNOSIS — I480 Paroxysmal atrial fibrillation: Secondary | ICD-10-CM

## 2023-05-10 MED ORDER — ENALAPRIL MALEATE 2.5 MG PO TABS: 2.5000 mg | ORAL_TABLET | Freq: Every day | ORAL | 3 refills | Status: DC

## 2023-05-10 NOTE — Progress Notes (Signed)
Cardiology Office Note   Date:  05/10/2023   ID:  Kyle Santos, DOB 02-13-1936, MRN [id number]  PCP:  Sherron Monday, MD  Cardiologist:  Adrian Blackwater, MD      History of Present Illness: Kyle Santos is a 87 y.o. male who presents for  Chief Complaint  Patient presents with   Follow-up    2 week f/u    Feels tired and dizzy as BP low  Dizziness This is a recurrent problem. The current episode started 1 to 4 weeks ago. The problem has been waxing and waning.      Past Medical History:  Diagnosis Date   A-fib [facility])    Arthritis    BPH (benign prostatic hyperplasia)    Depression    Heart murmur    HOH (hard of hearing)    Hypercholesteremia    Hypertension    Loss of equilibrium    being evaluated at this time.   Pneumonia    history of   Sleep apnea      Past Surgical History:  Procedure Laterality Date   CATARACT EXTRACTION W/PHACO Right 01/09/2017   Procedure: CATARACT EXTRACTION PHACO AND INTRAOCULAR LENS PLACEMENT (IOC);  Surgeon: Galen Manila, MD;  Location: [facility] ORS;  Service: Ophthalmology;  Laterality: Right;  [place] 01:06 AP% 19.7 CDE 13.18 Fluid pack lot # [id number]   CATARACT EXTRACTION W/PHACO Left 08/07/2017   Procedure: CATARACT EXTRACTION PHACO AND INTRAOCULAR LENS PLACEMENT (IOC)-LEFT;  Surgeon: Galen Manila, MD;  Location: [facility] ORS;  Service: Ophthalmology;  Laterality: Left;  [place]  00:43 AP% 15.0 CDE 6.50 Fluid pack lot # [id number]   COLONOSCOPY     JOINT REPLACEMENT     knee   JOINT REPLACEMENT     shoulder     Current Outpatient Medications  Medication Sig Dispense Refill   enalapril (VASOTEC) 2.5 MG tablet Take 1 tablet (2.5 mg total) by mouth daily. 90 tablet 3   atorvastatin (LIPITOR) 80 MG tablet Take 1 tablet (80 mg total) by mouth daily. 90 tablet 2   B Complex Vitamins (B COMPLEX-B12 PO) Take 1 tablet by mouth daily.     Cascara Sagrada 450 MG CAPS Take 450 mg by mouth 2 (two) times daily.     Cholecalciferol  (VITAMIN D3) 1000 units CAPS Take 1,000 Units by mouth daily at 3 pm.     fluticasone (FLONASE) 50 MCG/ACT nasal spray Place 1 spray into both nostrils daily. 16 mL 2   meclizine (ANTIVERT) 25 MG tablet Take 1 tablet (25 mg total) by mouth 3 (three) times daily as needed for dizziness. 30 tablet 1   Melatonin 5 MG TABS Take 5 mg by mouth at bedtime.      ranolazine (RANEXA) 500 MG 12 hr tablet Take 1 tablet (500 mg total) by mouth 2 (two) times daily. 60 tablet 3   tamsulosin (FLOMAX) 0.4 MG CAPS capsule Take 1 capsule (0.4 mg total) by mouth daily. 90 capsule 3   traZODone (DESYREL) 50 MG tablet TAKE 1 TABLET AT BEDTIME 90 tablet 1   warfarin (COUMADIN) 7.5 MG tablet Take 1 tablet (7.5 mg total) by mouth daily. 90 tablet 3   No current facility-administered medications for this visit.    Allergies:   Patient has no known allergies.    Social History:   reports that he quit smoking about 59 years ago. His smoking use included cigarettes. He started smoking about 66 years ago. He has a 3.5 pack-year  smoking history. He has never used smokeless tobacco. He reports current alcohol use. He reports that he does not use drugs.   Family History:  family history includes Heart disease in his father and mother.    ROS:     Review of Systems  Constitutional: Negative.   HENT: Negative.    Eyes: Negative.   Respiratory: Negative.    Gastrointestinal: Negative.   Genitourinary: Negative.   Musculoskeletal: Negative.   Skin: Negative.   Neurological:  Positive for dizziness.  Endo/Heme/Allergies: Negative.   Psychiatric/Behavioral: Negative.    All other systems reviewed and are negative.     All other systems are reviewed and negative.    PHYSICAL EXAM: VS:  BP (!) 80/55   Pulse 63   Ht 5\' 6"  (1.676 m)   Wt 170 lb 12.8 oz (77.5 kg)   SpO2 98%   BMI 27.57 kg/m  , BMI Body mass index is 27.57 kg/m. Last weight:  Wt Readings from Last 3 Encounters:  05/10/23 170 lb 12.8 oz (77.5  kg)  04/25/23 169 lb 6.4 oz (76.8 kg)  04/10/23 170 lb 9.6 oz (77.4 kg)     Physical Exam Vitals reviewed.  Constitutional:      Appearance: Normal appearance. He is normal weight.  HENT:     Head: Normocephalic.     Nose: Nose normal.     Mouth/Throat:     Mouth: Mucous membranes are moist.  Eyes:     Pupils: Pupils are equal, round, and reactive to light.  Cardiovascular:     Rate and Rhythm: Normal rate and regular rhythm.     Pulses: Normal pulses.     Heart sounds: Normal heart sounds.  Pulmonary:     Effort: Pulmonary effort is normal.  Abdominal:     General: Abdomen is flat. Bowel sounds are normal.  Musculoskeletal:        General: Normal range of motion.     Cervical back: Normal range of motion.  Skin:    General: Skin is warm.  Neurological:     General: No focal deficit present.     Mental Status: He is alert.  Psychiatric:        Mood and Affect: Mood normal.       EKG:   Recent Labs: 04/06/2023: ALT 24; BUN 26; Creatinine, Ser 1.44; Hemoglobin 13.4; Platelets 149; Potassium 4.7; Sodium 143    Lipid Panel    Component Value Date/Time   CHOL 104 04/06/2023 1007   TRIG 138 04/06/2023 1007   HDL 27 (L) 04/06/2023 1007   CHOLHDL 3.9 04/06/2023 1007   LDLCALC 53 04/06/2023 1007      Other studies Reviewed: Additional studies/ records that were reviewed today include:  Review of the above records demonstrates:       No data to display            ASSESSMENT AND PLAN:    ICD-10-CM   1. Hypotension due to drugs  I95.2 enalapril (VASOTEC) 2.5 MG tablet   stop losartan and start vasotec 2.5 daily    2. Coronary artery disease involving native coronary artery of native heart without angina pectoris  I25.10 enalapril (VASOTEC) 2.5 MG tablet    3. Paroxysmal atrial fibrillation (HCC)  I48.0 enalapril (VASOTEC) 2.5 MG tablet       Problem List Items Addressed This Visit       Cardiovascular and Mediastinum   Hypotension due to drugs -  Primary   Relevant  Medications   enalapril (VASOTEC) 2.5 MG tablet   Atrial fibrillation (HCC)   Relevant Medications   enalapril (VASOTEC) 2.5 MG tablet   Coronary artery disease involving native coronary artery of native heart   Relevant Medications   enalapril (VASOTEC) 2.5 MG tablet       Disposition:   Return in about 4 weeks (around 06/07/2023).  ?  Total time spent: 30 minutes  Signed,  Adrian Blackwater, MD  05/10/2023 10:25 AM    [facility]

## 2023-05-14 ENCOUNTER — Telehealth: Payer: Self-pay

## 2023-05-14 NOTE — Telephone Encounter (Signed)
Patient called asking where his new medication was sent to, double checked and it was sent to [facility]

## 2023-05-15 ENCOUNTER — Telehealth: Payer: Self-pay | Admitting: Internal Medicine

## 2023-05-15 NOTE — Telephone Encounter (Signed)
Patient's wife called in stating TJ was supposed to send in an Rx for Celebrex a while back but pharmacy has not gotten anything. Please advise.  [facility] [address]  [place] [phone]

## 2023-06-15 ENCOUNTER — Ambulatory Visit: Payer: Medicare HMO | Admitting: Cardiology

## 2023-06-15 ENCOUNTER — Encounter: Payer: Self-pay | Admitting: Cardiology

## 2023-06-15 VITALS — BP 124/78 | HR 64 | Ht 66.0 in | Wt 167.0 lb

## 2023-06-15 DIAGNOSIS — I952 Hypotension due to drugs: Secondary | ICD-10-CM | POA: Diagnosis not present

## 2023-06-15 NOTE — Assessment & Plan Note (Signed)
Patient reports dizziness improved. Blood pressure well controlled today on enalapril. Continue same dose.

## 2023-06-15 NOTE — Progress Notes (Signed)
Cardiology Office Note   Date:  06/15/2023   ID:  Kyle Santos, DOB 01/17/1936, MRN [id number]  PCP:  Sherron Monday, MD  Cardiologist:  Marisue Ivan, NP      History of Present Illness: Kyle Santos is a 87 y.o. male who presents for No chief complaint on file.   Patient in office for 1 month follow up. Patient seen one month ago for dizziness, hypotension, medications adjusted. Patient taking and tolerating enalapril.       Past Medical History:  Diagnosis Date   A-fib [facility])    Arthritis    BPH (benign prostatic hyperplasia)    Depression    Heart murmur    HOH (hard of hearing)    Hypercholesteremia    Hypertension    Loss of equilibrium    being evaluated at this time.   Pneumonia    history of   Sleep apnea      Past Surgical History:  Procedure Laterality Date   CATARACT EXTRACTION W/PHACO Right 01/09/2017   Procedure: CATARACT EXTRACTION PHACO AND INTRAOCULAR LENS PLACEMENT (IOC);  Surgeon: Galen Manila, MD;  Location: [facility] ORS;  Service: Ophthalmology;  Laterality: Right;  [place] 01:06 AP% 19.7 CDE 13.18 Fluid pack lot # [id number]   CATARACT EXTRACTION W/PHACO Left 08/07/2017   Procedure: CATARACT EXTRACTION PHACO AND INTRAOCULAR LENS PLACEMENT (IOC)-LEFT;  Surgeon: Galen Manila, MD;  Location: [facility] ORS;  Service: Ophthalmology;  Laterality: Left;  [place]  00:43 AP% 15.0 CDE 6.50 Fluid pack lot # [id number]   COLONOSCOPY     JOINT REPLACEMENT     knee   JOINT REPLACEMENT     shoulder     Current Outpatient Medications  Medication Sig Dispense Refill   atorvastatin (LIPITOR) 80 MG tablet Take 1 tablet (80 mg total) by mouth daily. 90 tablet 2   B Complex Vitamins (B COMPLEX-B12 PO) Take 1 tablet by mouth daily.     Cascara Sagrada 450 MG CAPS Take 450 mg by mouth 2 (two) times daily.     Cholecalciferol (VITAMIN D3) 1000 units CAPS Take 1,000 Units by mouth daily at 3 pm.     enalapril (VASOTEC) 2.5 MG tablet Take 1 tablet (2.5 mg  total) by mouth daily. 90 tablet 3   fluticasone (FLONASE) 50 MCG/ACT nasal spray Place 1 spray into both nostrils daily. 16 mL 2   meclizine (ANTIVERT) 25 MG tablet Take 1 tablet (25 mg total) by mouth 3 (three) times daily as needed for dizziness. 30 tablet 1   Melatonin 5 MG TABS Take 5 mg by mouth at bedtime.      ranolazine (RANEXA) 500 MG 12 hr tablet Take 1 tablet (500 mg total) by mouth 2 (two) times daily. 60 tablet 3   tamsulosin (FLOMAX) 0.4 MG CAPS capsule Take 1 capsule (0.4 mg total) by mouth daily. 90 capsule 3   traZODone (DESYREL) 50 MG tablet TAKE 1 TABLET AT BEDTIME 90 tablet 1   warfarin (COUMADIN) 7.5 MG tablet Take 1 tablet (7.5 mg total) by mouth daily. 90 tablet 3   No current facility-administered medications for this visit.    Allergies:   Patient has no known allergies.    Social History:   reports that he quit smoking about 59 years ago. His smoking use included cigarettes. He started smoking about 66 years ago. He has a 3.5 pack-year smoking history. He has never used smokeless tobacco. He reports current alcohol use. He reports that he does  not use drugs.   Family History:  family history includes Heart disease in his father and mother.    ROS:     Review of Systems  Constitutional: Negative.   HENT: Negative.    Eyes: Negative.   Respiratory: Negative.    Cardiovascular: Negative.   Gastrointestinal: Negative.   Genitourinary: Negative.   Musculoskeletal: Negative.   Skin: Negative.   Neurological: Negative.  Negative for dizziness.  Endo/Heme/Allergies: Negative.   Psychiatric/Behavioral: Negative.    All other systems reviewed and are negative.     All other systems are reviewed and negative.    PHYSICAL EXAM: VS:  BP 124/78   Pulse 64   Ht 5\' 6"  (1.676 m)   Wt 167 lb (75.8 kg)   SpO2 96%   BMI 26.95 kg/m  , BMI Body mass index is 26.95 kg/m. Last weight:  Wt Readings from Last 3 Encounters:  06/15/23 167 lb (75.8 kg)  05/10/23 170  lb 12.8 oz (77.5 kg)  04/25/23 169 lb 6.4 oz (76.8 kg)     Physical Exam Vitals and nursing note reviewed.  Constitutional:      Appearance: Normal appearance. He is normal weight.  HENT:     Head: Normocephalic and atraumatic.     Nose: Nose normal.     Mouth/Throat:     Mouth: Mucous membranes are moist.     Pharynx: Oropharynx is clear.  Eyes:     Extraocular Movements: Extraocular movements intact.     Conjunctiva/sclera: Conjunctivae normal.     Pupils: Pupils are equal, round, and reactive to light.  Cardiovascular:     Rate and Rhythm: Normal rate and regular rhythm.     Pulses: Normal pulses.     Heart sounds: Normal heart sounds.  Pulmonary:     Effort: Pulmonary effort is normal.     Breath sounds: Normal breath sounds.  Abdominal:     General: Abdomen is flat. Bowel sounds are normal.     Palpations: Abdomen is soft.  Musculoskeletal:        General: Normal range of motion.     Cervical back: Normal range of motion.  Skin:    General: Skin is warm and dry.  Neurological:     General: No focal deficit present.     Mental Status: He is alert and oriented to person, place, and time. Mental status is at baseline.  Psychiatric:        Mood and Affect: Mood normal.        Behavior: Behavior normal.        Thought Content: Thought content normal.        Judgment: Judgment normal.      EKG: none today  Recent Labs: 04/06/2023: ALT 24; BUN 26; Creatinine, Ser 1.44; Hemoglobin 13.4; Platelets 149; Potassium 4.7; Sodium 143    Lipid Panel    Component Value Date/Time   CHOL 104 04/06/2023 1007   TRIG 138 04/06/2023 1007   HDL 27 (L) 04/06/2023 1007   CHOLHDL 3.9 04/06/2023 1007   LDLCALC 53 04/06/2023 1007      ASSESSMENT AND PLAN:    ICD-10-CM   1. Hypotension due to drugs  I95.2        Problem List Items Addressed This Visit       Cardiovascular and Mediastinum   Hypotension due to drugs - Primary    Patient reports dizziness improved. Blood  pressure well controlled today on enalapril. Continue same dose.  Disposition:   Return in about 2 months (around 08/15/2023) for cardiology.    Total time spent: 25 minutes  Signed,  Google, NP  06/15/2023 10:50 AM    [facility]

## 2023-06-28 ENCOUNTER — Other Ambulatory Visit: Payer: Self-pay

## 2023-06-28 MED ORDER — RANOLAZINE ER 500 MG PO TB12: 500.0000 mg | ORAL_TABLET | Freq: Two times a day (BID) | ORAL | 3 refills | Status: DC

## 2023-07-07 ENCOUNTER — Other Ambulatory Visit: Payer: Self-pay | Admitting: Cardiovascular Disease

## 2023-07-07 DIAGNOSIS — I4811 Longstanding persistent atrial fibrillation: Secondary | ICD-10-CM

## 2023-07-07 DIAGNOSIS — I952 Hypotension due to drugs: Secondary | ICD-10-CM

## 2023-07-07 DIAGNOSIS — G4733 Obstructive sleep apnea (adult) (pediatric): Secondary | ICD-10-CM

## 2023-07-09 ENCOUNTER — Other Ambulatory Visit: Payer: Medicare HMO

## 2023-07-09 ENCOUNTER — Other Ambulatory Visit: Payer: Self-pay | Admitting: Cardiovascular Disease

## 2023-07-09 DIAGNOSIS — I4891 Unspecified atrial fibrillation: Secondary | ICD-10-CM | POA: Diagnosis not present

## 2023-07-09 DIAGNOSIS — N1831 Chronic kidney disease, stage 3a: Secondary | ICD-10-CM | POA: Diagnosis not present

## 2023-07-09 DIAGNOSIS — R7303 Prediabetes: Secondary | ICD-10-CM

## 2023-07-09 DIAGNOSIS — E782 Mixed hyperlipidemia: Secondary | ICD-10-CM | POA: Diagnosis not present

## 2023-07-09 DIAGNOSIS — D696 Thrombocytopenia, unspecified: Secondary | ICD-10-CM

## 2023-07-10 ENCOUNTER — Other Ambulatory Visit: Payer: Self-pay | Admitting: Cardiology

## 2023-07-10 LAB — CBC WITH DIFF/PLATELET
Basophils Absolute: 0 10*3/uL (ref 0.0–0.2)
Basos: 1 %
EOS (ABSOLUTE): 0.1 10*3/uL (ref 0.0–0.4)
Eos: 2 %
Hematocrit: 41.6 % (ref 37.5–51.0)
Hemoglobin: 13.5 g/dL (ref 13.0–17.7)
Immature Grans (Abs): 0 10*3/uL (ref 0.0–0.1)
Immature Granulocytes: 0 %
Lymphocytes Absolute: 1.6 10*3/uL (ref 0.7–3.1)
Lymphs: 30 %
MCH: 31.5 pg (ref 26.6–33.0)
MCHC: 32.5 g/dL (ref 31.5–35.7)
MCV: 97 fL (ref 79–97)
Monocytes Absolute: 0.5 10*3/uL (ref 0.1–0.9)
Monocytes: 10 %
Neutrophils Absolute: 3.1 10*3/uL (ref 1.4–7.0)
Neutrophils: 57 %
Platelets: 149 10*3/uL — ABNORMAL LOW (ref 150–450)
RBC: 4.29 x10E6/uL (ref 4.14–5.80)
RDW: 12.8 % (ref 11.6–15.4)
WBC: 5.4 10*3/uL (ref 3.4–10.8)

## 2023-07-10 LAB — COMPREHENSIVE METABOLIC PANEL
ALT: 32 [IU]/L (ref 0–44)
AST: 30 [IU]/L (ref 0–40)
Albumin: 4.2 g/dL (ref 3.7–4.7)
Alkaline Phosphatase: 82 [IU]/L (ref 44–121)
BUN/Creatinine Ratio: 16 (ref 10–24)
BUN: 23 mg/dL (ref 8–27)
Bilirubin Total: 0.7 mg/dL (ref 0.0–1.2)
CO2: 20 mmol/L (ref 20–29)
Calcium: 9.5 mg/dL (ref 8.6–10.2)
Chloride: 104 mmol/L (ref 96–106)
Creatinine, Ser: 1.4 mg/dL — ABNORMAL HIGH (ref 0.76–1.27)
Globulin, Total: 2.8 g/dL (ref 1.5–4.5)
Glucose: 117 mg/dL — ABNORMAL HIGH (ref 70–99)
Potassium: 4.8 mmol/L (ref 3.5–5.2)
Sodium: 140 mmol/L (ref 134–144)
Total Protein: 7 g/dL (ref 6.0–8.5)
eGFR: 49 mL/min/{1.73_m2} — ABNORMAL LOW (ref >59)

## 2023-07-10 LAB — LIPID PANEL
Chol/HDL Ratio: 4.5 ratio (ref 0.0–5.0)
Cholesterol, Total: 126 mg/dL (ref 100–199)
HDL: 28 mg/dL — ABNORMAL LOW (ref >39)
LDL Chol Calc (NIH): 68 mg/dL (ref 0–99)
Triglycerides: 177 mg/dL — ABNORMAL HIGH (ref 0–149)
VLDL Cholesterol Cal: 30 mg/dL (ref 5–40)

## 2023-07-10 LAB — PROTIME-INR
INR: 3.9 — ABNORMAL HIGH (ref 0.9–1.2)
Prothrombin Time: 39.2 s — ABNORMAL HIGH (ref 9.1–12.0)

## 2023-07-10 LAB — HEMOGLOBIN A1C
Est. average glucose Bld gHb Est-mCnc: 157 mg/dL
Hgb A1c MFr Bld: 7.1 % — ABNORMAL HIGH (ref 4.8–5.6)

## 2023-07-12 ENCOUNTER — Encounter: Payer: Self-pay | Admitting: Cardiovascular Disease

## 2023-07-13 ENCOUNTER — Ambulatory Visit (INDEPENDENT_AMBULATORY_CARE_PROVIDER_SITE_OTHER): Payer: Medicare HMO | Admitting: Internal Medicine

## 2023-07-13 ENCOUNTER — Encounter: Payer: Self-pay | Admitting: Internal Medicine

## 2023-07-13 VITALS — BP 114/84 | HR 63 | Ht 66.0 in | Wt 168.0 lb

## 2023-07-13 DIAGNOSIS — N1831 Chronic kidney disease, stage 3a: Secondary | ICD-10-CM | POA: Diagnosis not present

## 2023-07-13 DIAGNOSIS — E782 Mixed hyperlipidemia: Secondary | ICD-10-CM | POA: Diagnosis not present

## 2023-07-13 DIAGNOSIS — E1165 Type 2 diabetes mellitus with hyperglycemia: Secondary | ICD-10-CM | POA: Diagnosis not present

## 2023-07-13 DIAGNOSIS — F5101 Primary insomnia: Secondary | ICD-10-CM

## 2023-07-13 DIAGNOSIS — Z0130 Encounter for examination of blood pressure without abnormal findings: Secondary | ICD-10-CM

## 2023-07-13 MED ORDER — EMPAGLIFLOZIN 10 MG PO TABS: 10.0000 mg | ORAL_TABLET | Freq: Every day | ORAL | 0 refills | Status: DC

## 2023-07-13 MED ORDER — TRAZODONE HCL 100 MG PO TABS: 100.0000 mg | ORAL_TABLET | Freq: Every day | ORAL | 0 refills | Status: DC

## 2023-07-13 NOTE — Progress Notes (Signed)
Established Patient Office Visit  Subjective:  Patient ID: Kyle Santos, male    DOB: 1936-06-12  Age: 87 y.o. MRN: [id number]  Chief Complaint  Patient presents with   Follow-up    3 month follow up    No new complaints, here for lab review and medication refills. Labs reviewed and notable for A1c of 7.1. LDL and TC well controlled on lab review. Triglycerides elevated however while CMP notable for stable ckd 3a.      No other concerns at this time.   Past Medical History:  Diagnosis Date   A-fib [facility])    Arthritis    BPH (benign prostatic hyperplasia)    Depression    Heart murmur    HOH (hard of hearing)    Hypercholesteremia    Hypertension    Loss of equilibrium    being evaluated at this time.   Pneumonia    history of   Sleep apnea     Past Surgical History:  Procedure Laterality Date   CATARACT EXTRACTION W/PHACO Right 01/09/2017   Procedure: CATARACT EXTRACTION PHACO AND INTRAOCULAR LENS PLACEMENT (IOC);  Surgeon: Galen Manila, MD;  Location: [facility] ORS;  Service: Ophthalmology;  Laterality: Right;  [place] 01:06 AP% 19.7 CDE 13.18 Fluid pack lot # [id number]   CATARACT EXTRACTION W/PHACO Left 08/07/2017   Procedure: CATARACT EXTRACTION PHACO AND INTRAOCULAR LENS PLACEMENT (IOC)-LEFT;  Surgeon: Galen Manila, MD;  Location: [facility] ORS;  Service: Ophthalmology;  Laterality: Left;  [place]  00:43 AP% 15.0 CDE 6.50 Fluid pack lot # [id number]   COLONOSCOPY     JOINT REPLACEMENT     knee   JOINT REPLACEMENT     shoulder    Social History   Socioeconomic History   Marital status: Married    Spouse name: Bonita Quin   Number of children: 3   Years of education: College Degree   Highest education level: Bachelor'Suad Autrey degree (e.g., BA, AB, BS)  Occupational History   Occupation: Retired  Tobacco Use   Smoking status: Former    Current packs/day: 0.00    Average packs/day: 0.5 packs/day for 7.0 years (3.5 ttl pk-yrs)    Types: Cigarettes    Start date: 08/21/1956     Quit date: 08/22/1963    Years since quitting: 59.9   Smokeless tobacco: Never  Vaping Use   Vaping status: Never Used  Substance and Sexual Activity   Alcohol use: Yes    Alcohol/week: 0.0 standard drinks of alcohol    Comment: occasional   Drug use: No   Sexual activity: Not Currently    Birth control/protection: None  Other Topics Concern   Not on file  Social History Narrative   Drinks about 2 cups of caffeine.   Social Determinants of Health   Financial Resource Strain: Low Risk  (06/01/2021)   Received from [facility]   Overall Financial Resource Strain (CARDIA)    Difficulty of Paying Living Expenses: Not hard at all  Recent Concern: Financial Resource Strain - Medium Risk (05/13/2021)   Overall Financial Resource Strain (CARDIA)    Difficulty of Paying Living Expenses: Somewhat hard  Food Insecurity: No Food Insecurity (06/01/2021)   Received from [facility]   Hunger Vital Sign  Transportation Needs: No Transportation Needs (06/01/2021)   Received from [facility], [facility]   [facility] - Transportation    Lack of Transportation (Medical): No    Lack of Transportation (Non-Medical): No  Physical Activity:  Sufficiently Active (05/13/2021)   Exercise Vital Sign    Days of Exercise per Week: 3 days    Minutes of Exercise per Session: 150+ min  Stress: Not on file  Social Connections: Socially Integrated (05/13/2021)   Social Connection and Isolation Panel [NHANES]    Frequency of Communication with Friends and Family: More than three times a week    Frequency of Social Gatherings with Friends and Family: Once a week    Attends Religious Services: More than 4 times per year    Active Member of [employer] or Organizations: Yes    Attends Engineer, structural: More than 4 times per year    Marital Status: Married  Catering manager Violence: Not At Risk (05/13/2021)   Humiliation, Afraid, Rape, and Kick questionnaire    Fear of  Current or Ex-Partner: No    Emotionally Abused: No    Physically Abused: No    Sexually Abused: No    Family History  Problem Relation Age of Onset   Heart disease Mother    Heart disease Father     No Known Allergies  Outpatient Medications Prior to Visit  Medication Sig   atorvastatin (LIPITOR) 80 MG tablet TAKE 1 TABLET EVERY DAY   B Complex Vitamins (B COMPLEX-B12 PO) Take 1 tablet by mouth daily.   Cascara Sagrada 450 MG CAPS Take 450 mg by mouth 2 (two) times daily.   Cholecalciferol (VITAMIN D3) 1000 units CAPS Take 1,000 Units by mouth daily at 3 pm.   enalapril (VASOTEC) 2.5 MG tablet Take 1 tablet (2.5 mg total) by mouth daily.   meclizine (ANTIVERT) 25 MG tablet Take 1 tablet (25 mg total) by mouth 3 (three) times daily as needed for dizziness.   Melatonin 5 MG TABS Take 5 mg by mouth at bedtime.    ranolazine (RANEXA) 500 MG 12 hr tablet Take 1 tablet (500 mg total) by mouth 2 (two) times daily.   tamsulosin (FLOMAX) 0.4 MG CAPS capsule Take 1 capsule (0.4 mg total) by mouth daily.   warfarin (COUMADIN) 7.5 MG tablet Take 1 tablet (7.5 mg total) by mouth daily.   [DISCONTINUED] traZODone (DESYREL) 50 MG tablet TAKE 1 TABLET AT BEDTIME   fluticasone (FLONASE) 50 MCG/ACT nasal spray Place 1 spray into both nostrils daily. (Patient not taking: Reported on 07/13/2023)   No facility-administered medications prior to visit.    ROS     Objective:   BP 114/84   Pulse 63   Ht 5\' 6"  (1.676 m)   Wt 168 lb (76.2 kg)   SpO2 96%   BMI 27.12 kg/m   Vitals:   07/13/23 1026  BP: 114/84  Pulse: 63  Height: 5\' 6"  (1.676 m)  Weight: 168 lb (76.2 kg)  SpO2: 96%  BMI (Calculated): 27.13    Physical Exam   No results found for any visits on 07/13/23.  Recent Results (from the past 2160 hour(Kandee Escalante))  Lipid panel     Status: Abnormal   Collection Time: 07/09/23 10:37 AM  Result Value Ref Range   Cholesterol, Total 126 100 - 199 mg/dL   Triglycerides 295 (H) 0 - 149  mg/dL   HDL 28 (L) >62 mg/dL   VLDL Cholesterol Cal 30 5 - 40 mg/dL   LDL Chol Calc (NIH) 68 0 - 99 mg/dL   Chol/HDL Ratio 4.5 0.0 - 5.0 ratio    Comment:  T. Chol/HDL Ratio                                             Men  Women                               1/2 Avg.Risk  3.4    3.3                                   Avg.Risk  5.0    4.4                                2X Avg.Risk  9.6    7.1                                3X Avg.Risk 23.4   11.0   Hemoglobin A1c     Status: Abnormal   Collection Time: 07/09/23 10:37 AM  Result Value Ref Range   Hgb A1c MFr Bld 7.1 (H) 4.8 - 5.6 %    Comment:          Prediabetes: 5.7 - 6.4          Diabetes: >6.4          Glycemic control for adults with diabetes: <7.0    Est. average glucose Bld gHb Est-mCnc 157 mg/dL  Comprehensive metabolic panel     Status: Abnormal   Collection Time: 07/09/23 10:37 AM  Result Value Ref Range   Glucose 117 (H) 70 - 99 mg/dL   BUN 23 8 - 27 mg/dL   Creatinine, Ser 3.24 (H) 0.76 - 1.27 mg/dL   eGFR 49 (L) >40 NU/UVO/5.36   BUN/Creatinine Ratio 16 10 - 24   Sodium 140 134 - 144 mmol/L   Potassium 4.8 3.5 - 5.2 mmol/L   Chloride 104 96 - 106 mmol/L   CO2 20 20 - 29 mmol/L   Calcium 9.5 8.6 - 10.2 mg/dL   Total Protein 7.0 6.0 - 8.5 g/dL   Albumin 4.2 3.7 - 4.7 g/dL   Globulin, Total 2.8 1.5 - 4.5 g/dL   Bilirubin Total 0.7 0.0 - 1.2 mg/dL   Alkaline Phosphatase 82 44 - 121 IU/L   AST 30 0 - 40 IU/L   ALT 32 0 - 44 IU/L  CBC With Diff/Platelet     Status: Abnormal   Collection Time: 07/09/23 10:37 AM  Result Value Ref Range   WBC 5.4 3.4 - 10.8 x10E3/uL   RBC 4.29 4.14 - 5.80 x10E6/uL   Hemoglobin 13.5 13.0 - 17.7 g/dL   Hematocrit 64.4 03.4 - 51.0 %   MCV 97 79 - 97 fL   MCH 31.5 26.6 - 33.0 pg   MCHC 32.5 31.5 - 35.7 g/dL   RDW 74.2 59.5 - 63.8 %   Platelets 149 (L) 150 - 450 x10E3/uL   Neutrophils 57 Not Estab. %   Lymphs 30 Not Estab. %   Monocytes 10 Not  Estab. %   Eos 2 Not Estab. %   Basos 1 Not Estab. %   Neutrophils Absolute 3.1 1.4 - 7.0 x10E3/uL  Lymphocytes Absolute 1.6 0.7 - 3.1 x10E3/uL   Monocytes Absolute 0.5 0.1 - 0.9 x10E3/uL   EOS (ABSOLUTE) 0.1 0.0 - 0.4 x10E3/uL   Basophils Absolute 0.0 0.0 - 0.2 x10E3/uL   Immature Granulocytes 0 Not Estab. %   Immature Grans (Abs) 0.0 0.0 - 0.1 x10E3/uL  Protime-INR     Status: Abnormal   Collection Time: 07/09/23 10:42 AM  Result Value Ref Range   INR 3.9 (H) 0.9 - 1.2    Comment: Reference interval is for non-anticoagulated patients. Suggested INR therapeutic range for Vitamin K antagonist therapy:    Standard Dose (moderate intensity                   therapeutic range):       2.0 - 3.0    Higher intensity therapeutic range       2.5 - 3.5    Prothrombin Time 39.2 (H) 9.1 - 12.0 sec      Assessment & Plan:  As per problem list. Problem List Items Addressed This Visit       Other   Mixed hyperlipidemia   Relevant Orders   Lipid panel   Other Visit Diagnoses     CKD stage 3a, GFR 45-59 ml/min (HCC)    -  Primary   Relevant Medications   empagliflozin (JARDIANCE) 10 MG TABS tablet   Other Relevant Orders   Comprehensive metabolic panel   Uncontrolled type 2 diabetes mellitus with hyperglycemia (HCC)       Relevant Medications   empagliflozin (JARDIANCE) 10 MG TABS tablet   Other Relevant Orders   Hemoglobin A1c   Amb Referral to Nutrition and Diabetic Education   Primary insomnia       Relevant Medications   traZODone (DESYREL) 100 MG tablet       Return in about 3 months (around 10/13/2023) for fu with labs prior.   Total time spent: 30 minutes  Luna Fuse, MD  07/13/2023   This document may have been prepared by [facility] Voice Recognition software and as such may include unintentional dictation errors.

## 2023-07-16 ENCOUNTER — Other Ambulatory Visit: Payer: Self-pay | Admitting: Cardiology

## 2023-07-26 ENCOUNTER — Telehealth: Payer: Self-pay | Admitting: Internal Medicine

## 2023-07-26 NOTE — Telephone Encounter (Signed)
Called patient regarding his recent INR results of 3.9. SK sent him a MyChart message telling him to come in but patient has not seen the message. We need to get in touch with him and get him scheduled ASAP.

## 2023-07-30 ENCOUNTER — Other Ambulatory Visit: Payer: Self-pay | Admitting: Cardiovascular Disease

## 2023-07-30 ENCOUNTER — Other Ambulatory Visit: Payer: Medicare HMO

## 2023-07-30 DIAGNOSIS — I4891 Unspecified atrial fibrillation: Secondary | ICD-10-CM | POA: Diagnosis not present

## 2023-07-30 DIAGNOSIS — E1165 Type 2 diabetes mellitus with hyperglycemia: Secondary | ICD-10-CM

## 2023-07-30 DIAGNOSIS — E782 Mixed hyperlipidemia: Secondary | ICD-10-CM | POA: Diagnosis not present

## 2023-07-30 DIAGNOSIS — N1831 Chronic kidney disease, stage 3a: Secondary | ICD-10-CM | POA: Diagnosis not present

## 2023-07-31 LAB — COMPREHENSIVE METABOLIC PANEL
ALT: 26 [IU]/L (ref 0–44)
AST: 26 [IU]/L (ref 0–40)
Albumin: 4.4 g/dL (ref 3.7–4.7)
Alkaline Phosphatase: 82 [IU]/L (ref 44–121)
BUN/Creatinine Ratio: 15 (ref 10–24)
BUN: 22 mg/dL (ref 8–27)
Bilirubin Total: 0.8 mg/dL (ref 0.0–1.2)
CO2: 22 mmol/L (ref 20–29)
Calcium: 9.4 mg/dL (ref 8.6–10.2)
Chloride: 105 mmol/L (ref 96–106)
Creatinine, Ser: 1.47 mg/dL — ABNORMAL HIGH (ref 0.76–1.27)
Globulin, Total: 2.6 g/dL (ref 1.5–4.5)
Glucose: 112 mg/dL — ABNORMAL HIGH (ref 70–99)
Potassium: 4.8 mmol/L (ref 3.5–5.2)
Sodium: 142 mmol/L (ref 134–144)
Total Protein: 7 g/dL (ref 6.0–8.5)
eGFR: 46 mL/min/{1.73_m2} — ABNORMAL LOW (ref >59)

## 2023-07-31 LAB — LIPID PANEL
Chol/HDL Ratio: 4.4 {ratio} (ref 0.0–5.0)
Cholesterol, Total: 124 mg/dL (ref 100–199)
HDL: 28 mg/dL — ABNORMAL LOW (ref >39)
LDL Chol Calc (NIH): 66 mg/dL (ref 0–99)
Triglycerides: 173 mg/dL — ABNORMAL HIGH (ref 0–149)
VLDL Cholesterol Cal: 30 mg/dL (ref 5–40)

## 2023-07-31 LAB — HEMOGLOBIN A1C
Est. average glucose Bld gHb Est-mCnc: 160 mg/dL
Hgb A1c MFr Bld: 7.2 % — ABNORMAL HIGH (ref 4.8–5.6)

## 2023-07-31 LAB — PROTIME-INR
INR: 3 — ABNORMAL HIGH (ref 0.9–1.2)
Prothrombin Time: 30.9 s — ABNORMAL HIGH (ref 9.1–12.0)

## 2023-08-01 ENCOUNTER — Other Ambulatory Visit: Payer: Self-pay | Admitting: Internal Medicine

## 2023-08-01 DIAGNOSIS — E1165 Type 2 diabetes mellitus with hyperglycemia: Secondary | ICD-10-CM

## 2023-08-01 MED ORDER — EMPAGLIFLOZIN 25 MG PO TABS: 25.0000 mg | ORAL_TABLET | Freq: Every day | ORAL | 1 refills | Status: DC

## 2023-08-10 ENCOUNTER — Encounter: Payer: Self-pay | Admitting: Cardiovascular Disease

## 2023-08-10 ENCOUNTER — Other Ambulatory Visit: Payer: Self-pay | Admitting: Cardiovascular Disease

## 2023-08-10 ENCOUNTER — Ambulatory Visit: Payer: Medicare HMO | Admitting: Cardiovascular Disease

## 2023-08-10 VITALS — BP 104/74 | HR 55 | Ht 66.0 in | Wt 162.8 lb

## 2023-08-10 DIAGNOSIS — E1165 Type 2 diabetes mellitus with hyperglycemia: Secondary | ICD-10-CM

## 2023-08-10 DIAGNOSIS — I4891 Unspecified atrial fibrillation: Secondary | ICD-10-CM | POA: Diagnosis not present

## 2023-08-10 DIAGNOSIS — N1831 Chronic kidney disease, stage 3a: Secondary | ICD-10-CM

## 2023-08-10 DIAGNOSIS — E782 Mixed hyperlipidemia: Secondary | ICD-10-CM

## 2023-08-10 DIAGNOSIS — R42 Dizziness and giddiness: Secondary | ICD-10-CM | POA: Diagnosis not present

## 2023-08-10 DIAGNOSIS — I2510 Atherosclerotic heart disease of native coronary artery without angina pectoris: Secondary | ICD-10-CM | POA: Diagnosis not present

## 2023-08-10 DIAGNOSIS — I480 Paroxysmal atrial fibrillation: Secondary | ICD-10-CM

## 2023-08-10 DIAGNOSIS — Z0130 Encounter for examination of blood pressure without abnormal findings: Secondary | ICD-10-CM

## 2023-08-10 NOTE — Progress Notes (Signed)
Cardiology Office Note   Date:  08/10/2023   ID:  Kyle Santos, DOB [birth year], MRN [id number]  PCP:  Sherron Monday, MD  Cardiologist:  Adrian Blackwater, MD      History of Present Illness: Kyle Santos is a 87 y.o. male who presents for  Chief Complaint  Patient presents with   Follow-up    2 month follow up    No complaints      Past Medical History:  Diagnosis Date   A-fib [facility])    Arthritis    BPH (benign prostatic hyperplasia)    Depression    Heart murmur    HOH (hard of hearing)    Hypercholesteremia    Hypertension    Loss of equilibrium    being evaluated at this time.   Pneumonia    history of   Sleep apnea      Past Surgical History:  Procedure Laterality Date   CATARACT EXTRACTION W/PHACO Right 01/09/2017   Procedure: CATARACT EXTRACTION PHACO AND INTRAOCULAR LENS PLACEMENT (IOC);  Surgeon: Kyle Manila, MD;  Location: [facility] ORS;  Service: Ophthalmology;  Laterality: Right;  [place] 01:06 AP% 19.7 CDE 13.18 Fluid pack lot # [id number]   CATARACT EXTRACTION W/PHACO Left 08/07/2017   Procedure: CATARACT EXTRACTION PHACO AND INTRAOCULAR LENS PLACEMENT (IOC)-LEFT;  Surgeon: Kyle Manila, MD;  Location: [facility] ORS;  Service: Ophthalmology;  Laterality: Left;  [place]  00:43 AP% 15.0 CDE 6.50 Fluid pack lot # [id number]   COLONOSCOPY     JOINT REPLACEMENT     knee   JOINT REPLACEMENT     shoulder     Current Outpatient Medications  Medication Sig Dispense Refill   atorvastatin (LIPITOR) 80 MG tablet TAKE 1 TABLET EVERY DAY 90 tablet 3   B Complex Vitamins (B COMPLEX-B12 PO) Take 1 tablet by mouth daily.     Cascara Sagrada 450 MG CAPS Take 450 mg by mouth 2 (two) times daily.     Cholecalciferol (VITAMIN D3) 1000 units CAPS Take 1,000 Units by mouth daily at 3 pm.     empagliflozin (JARDIANCE) 25 MG TABS tablet Take 1 tablet (25 mg total) by mouth daily before breakfast. 90 tablet 1   enalapril (VASOTEC) 2.5 MG tablet Take 1 tablet (2.5 mg  total) by mouth daily. 90 tablet 3   Melatonin 5 MG TABS Take 5 mg by mouth at bedtime.      tamsulosin (FLOMAX) 0.4 MG CAPS capsule Take 1 capsule (0.4 mg total) by mouth daily. 90 capsule 3   traZODone (DESYREL) 100 MG tablet Take 1 tablet (100 mg total) by mouth at bedtime. 90 tablet 0   warfarin (COUMADIN) 7.5 MG tablet Take 1 tablet (7.5 mg total) by mouth daily. (Patient taking differently: Take 5 mg by mouth daily.) 90 tablet 3   fluticasone (FLONASE) 50 MCG/ACT nasal spray Place 1 spray into both nostrils daily. (Patient not taking: Reported on 08/10/2023) 16 mL 2   meclizine (ANTIVERT) 25 MG tablet Take 1 tablet (25 mg total) by mouth 3 (three) times daily as needed for dizziness. 30 tablet 1   No current facility-administered medications for this visit.    Allergies:   Patient has no known allergies.    Social History:   reports that he quit smoking about 60 years ago. His smoking use included cigarettes. He started smoking about 67 years ago. He has a 3.5 pack-year smoking history. He has never used smokeless tobacco. He reports current alcohol use.  He reports that he does not use drugs.   Family History:  family history includes Heart disease in his father and mother.    ROS:     Review of Systems  Constitutional: Negative.   HENT: Negative.    Eyes: Negative.   Respiratory: Negative.    Gastrointestinal: Negative.   Genitourinary: Negative.   Musculoskeletal: Negative.   Skin: Negative.   Neurological: Negative.   Endo/Heme/Allergies: Negative.   Psychiatric/Behavioral: Negative.    All other systems reviewed and are negative.     All other systems are reviewed and negative.    PHYSICAL EXAM: VS:  BP 104/74   Pulse (!) 55   Ht 5\' 6"  (1.676 m)   Wt 162 lb 12.8 oz (73.8 kg)   SpO2 95%   BMI 26.28 kg/m  , BMI Body mass index is 26.28 kg/m. Last weight:  Wt Readings from Last 3 Encounters:  08/10/23 162 lb 12.8 oz (73.8 kg)  07/13/23 168 lb (76.2 kg)   06/15/23 167 lb (75.8 kg)     Physical Exam Vitals reviewed.  Constitutional:      Appearance: Normal appearance. He is normal weight.  HENT:     Head: Normocephalic.     Nose: Nose normal.     Mouth/Throat:     Mouth: Mucous membranes are moist.  Eyes:     Pupils: Pupils are equal, round, and reactive to light.  Cardiovascular:     Rate and Rhythm: Normal rate and regular rhythm.     Pulses: Normal pulses.     Heart sounds: Normal heart sounds.  Pulmonary:     Effort: Pulmonary effort is normal.  Abdominal:     General: Abdomen is flat. Bowel sounds are normal.  Musculoskeletal:        General: Normal range of motion.     Cervical back: Normal range of motion.  Skin:    General: Skin is warm.  Neurological:     General: No focal deficit present.     Mental Status: He is alert.  Psychiatric:        Mood and Affect: Mood normal.       EKG:   Recent Labs: 07/09/2023: Hemoglobin 13.5; Platelets 149 07/30/2023: ALT 26; BUN 22; Creatinine, Ser 1.47; Potassium 4.8; Sodium 142    Lipid Panel    Component Value Date/Time   CHOL 124 07/30/2023 1019   TRIG 173 (H) 07/30/2023 1019   HDL 28 (L) 07/30/2023 1019   CHOLHDL 4.4 07/30/2023 1019   LDLCALC 66 07/30/2023 1019      Other studies Reviewed: Additional studies/ records that were reviewed today include:  Review of the above records demonstrates:       No data to display            ASSESSMENT AND PLAN:    ICD-10-CM   1. CKD stage 3a, GFR 45-59 ml/min (HCC)  N18.31 Protime-INR    2. Uncontrolled type 2 diabetes mellitus with hyperglycemia (HCC)  E11.65 Protime-INR    3. Mixed hyperlipidemia  E78.2 Protime-INR    4. Paroxysmal atrial fibrillation (HCC)  I48.0 Protime-INR   INR was over 3, and coumadin 7.5 mg and now on 5 mg daily, will check INR again    5. Coronary artery disease involving native coronary artery of native heart without angina pectoris  I25.10 Protime-INR    6. Dizziness  R42     Thinks may be due to ranexa 500 bid  so will stop it  for now       Problem List Items Addressed This Visit       Cardiovascular and Mediastinum   Atrial fibrillation (HCC)   Relevant Orders   Protime-INR   Coronary artery disease involving native coronary artery of native heart   Relevant Orders   Protime-INR     Other   Mixed hyperlipidemia   Relevant Orders   Protime-INR   Other Visit Diagnoses       CKD stage 3a, GFR 45-59 ml/min (HCC)    -  Primary   Relevant Orders   Protime-INR     Uncontrolled type 2 diabetes mellitus with hyperglycemia (HCC)       Relevant Orders   Protime-INR     Dizziness       Thinks may be due to ranexa 500 bid  so will stop it for now          Disposition:   No follow-ups on file.    Total time spent: 30 minutes  Signed,  Adrian Blackwater, MD  08/10/2023 11:36 AM    [facility]

## 2023-08-11 LAB — PROTIME-INR
INR: 1.7 — ABNORMAL HIGH (ref 0.9–1.2)
Prothrombin Time: 18.5 s — ABNORMAL HIGH (ref 9.1–12.0)

## 2023-08-30 ENCOUNTER — Telehealth: Payer: Self-pay

## 2023-08-30 ENCOUNTER — Other Ambulatory Visit: Payer: Medicare HMO

## 2023-08-30 ENCOUNTER — Other Ambulatory Visit: Payer: Self-pay | Admitting: Cardiovascular Disease

## 2023-08-30 DIAGNOSIS — N1831 Chronic kidney disease, stage 3a: Secondary | ICD-10-CM | POA: Diagnosis not present

## 2023-08-30 DIAGNOSIS — E1165 Type 2 diabetes mellitus with hyperglycemia: Secondary | ICD-10-CM

## 2023-08-30 DIAGNOSIS — E782 Mixed hyperlipidemia: Secondary | ICD-10-CM | POA: Diagnosis not present

## 2023-08-30 DIAGNOSIS — I4891 Unspecified atrial fibrillation: Secondary | ICD-10-CM | POA: Diagnosis not present

## 2023-08-31 LAB — LIPID PANEL
Chol/HDL Ratio: 4.3 {ratio} (ref 0.0–5.0)
Cholesterol, Total: 113 mg/dL (ref 100–199)
HDL: 26 mg/dL — ABNORMAL LOW (ref >39)
LDL Chol Calc (NIH): 62 mg/dL (ref 0–99)
Triglycerides: 143 mg/dL (ref 0–149)
VLDL Cholesterol Cal: 25 mg/dL (ref 5–40)

## 2023-08-31 LAB — COMPREHENSIVE METABOLIC PANEL
ALT: 25 [IU]/L (ref 0–44)
AST: 26 [IU]/L (ref 0–40)
Albumin: 4.2 g/dL (ref 3.7–4.7)
Alkaline Phosphatase: 113 [IU]/L (ref 44–121)
BUN/Creatinine Ratio: 19 (ref 10–24)
BUN: 26 mg/dL (ref 8–27)
Bilirubin Total: 1.1 mg/dL (ref 0.0–1.2)
CO2: 21 mmol/L (ref 20–29)
Calcium: 9.7 mg/dL (ref 8.6–10.2)
Chloride: 106 mmol/L (ref 96–106)
Creatinine, Ser: 1.38 mg/dL — ABNORMAL HIGH (ref 0.76–1.27)
Globulin, Total: 3 g/dL (ref 1.5–4.5)
Glucose: 153 mg/dL — ABNORMAL HIGH (ref 70–99)
Potassium: 4.8 mmol/L (ref 3.5–5.2)
Sodium: 142 mmol/L (ref 134–144)
Total Protein: 7.2 g/dL (ref 6.0–8.5)
eGFR: 50 mL/min/{1.73_m2} — ABNORMAL LOW (ref >59)

## 2023-08-31 LAB — HEMOGLOBIN A1C
Est. average glucose Bld gHb Est-mCnc: 154 mg/dL
Hgb A1c MFr Bld: 7 % — ABNORMAL HIGH (ref 4.8–5.6)

## 2023-08-31 LAB — PROTIME-INR
INR: 1.6 — ABNORMAL HIGH (ref 0.9–1.2)
Prothrombin Time: 17.5 s — ABNORMAL HIGH (ref 9.1–12.0)

## 2023-09-13 ENCOUNTER — Other Ambulatory Visit: Payer: Medicare HMO

## 2023-09-13 ENCOUNTER — Other Ambulatory Visit: Payer: Self-pay | Admitting: Cardiovascular Disease

## 2023-09-13 DIAGNOSIS — I4891 Unspecified atrial fibrillation: Secondary | ICD-10-CM | POA: Diagnosis not present

## 2023-09-14 LAB — PROTIME-INR
INR: 1.2 (ref 0.9–1.2)
Prothrombin Time: 13.8 s — ABNORMAL HIGH (ref 9.1–12.0)

## 2023-09-20 ENCOUNTER — Other Ambulatory Visit: Payer: Self-pay | Admitting: Internal Medicine

## 2023-09-20 ENCOUNTER — Other Ambulatory Visit: Payer: Self-pay | Admitting: Cardiovascular Disease

## 2023-09-20 DIAGNOSIS — F5101 Primary insomnia: Secondary | ICD-10-CM

## 2023-09-20 DIAGNOSIS — I4891 Unspecified atrial fibrillation: Secondary | ICD-10-CM

## 2023-09-20 DIAGNOSIS — N401 Enlarged prostate with lower urinary tract symptoms: Secondary | ICD-10-CM

## 2023-09-24 ENCOUNTER — Encounter: Payer: Self-pay | Admitting: Cardiovascular Disease

## 2023-09-24 ENCOUNTER — Ambulatory Visit: Payer: Medicare HMO | Admitting: Cardiovascular Disease

## 2023-09-24 VITALS — BP 110/60 | HR 85 | Ht 66.0 in | Wt 157.0 lb

## 2023-09-24 DIAGNOSIS — I2510 Atherosclerotic heart disease of native coronary artery without angina pectoris: Secondary | ICD-10-CM | POA: Diagnosis not present

## 2023-09-24 DIAGNOSIS — E782 Mixed hyperlipidemia: Secondary | ICD-10-CM | POA: Diagnosis not present

## 2023-09-24 DIAGNOSIS — I10 Essential (primary) hypertension: Secondary | ICD-10-CM

## 2023-09-24 DIAGNOSIS — I480 Paroxysmal atrial fibrillation: Secondary | ICD-10-CM | POA: Diagnosis not present

## 2023-09-24 DIAGNOSIS — I4891 Unspecified atrial fibrillation: Secondary | ICD-10-CM | POA: Diagnosis not present

## 2023-09-24 DIAGNOSIS — G4733 Obstructive sleep apnea (adult) (pediatric): Secondary | ICD-10-CM

## 2023-09-24 MED ORDER — WARFARIN SODIUM 5 MG PO TABS: ORAL_TABLET | ORAL | 2 refills | Status: DC

## 2023-09-24 NOTE — Progress Notes (Signed)
Cardiology Office Note   Date:  09/24/2023   ID:  Kyle Santos, DOB 1935-10-07, MRN [id number]  PCP:  Sherron Monday, MD  Cardiologist:  Adrian Blackwater, MD      History of Present Illness: Kyle Santos is a 88 y.o. male who presents for  Chief Complaint  Patient presents with   Follow-up    INR Check    Feels tired      Past Medical History:  Diagnosis Date   A-fib (HCC)    Arthritis    BPH (benign prostatic hyperplasia)    Depression    Heart murmur    HOH (hard of hearing)    Hypercholesteremia    Hypertension    Loss of equilibrium    being evaluated at this time.   Pneumonia    history of   Sleep apnea      Past Surgical History:  Procedure Laterality Date   CATARACT EXTRACTION W/PHACO Right 01/09/2017   Procedure: CATARACT EXTRACTION PHACO AND INTRAOCULAR LENS PLACEMENT (IOC);  Surgeon: Galen Manila, MD;  Location: [facility] ORS;  Service: Ophthalmology;  Laterality: Right;  [place] 01:06 AP% 19.7 CDE 13.18 Fluid pack lot # [id number]   CATARACT EXTRACTION W/PHACO Left 08/07/2017   Procedure: CATARACT EXTRACTION PHACO AND INTRAOCULAR LENS PLACEMENT (IOC)-LEFT;  Surgeon: Galen Manila, MD;  Location: [facility] OR[facility];  Service: Ophthalmology;  Laterality: Left;  [place]  00:43 AP% 15.0 CDE 6.50 Fluid pack lot # [id number]   COLONOSCOPY     JOINT REPLACEMENT     knee   JOINT REPLACEMENT     shoulder     Current Outpatient Medications  Medication Sig Dispense Refill   atorvastatin (LIPITOR) 80 MG tablet TAKE 1 TABLET EVERY DAY 90 tablet 3   B Complex Vitamins (B COMPLEX-B12 PO) Take 1 tablet by mouth daily.     Cascara Sagrada 450 MG CAPS Take 450 mg by mouth 2 (two) times daily.     Cholecalciferol (VITAMIN D3) 1000 units CAPS Take 1,000 Units by mouth daily at 3 pm.     empagliflozin (JARDIANCE) 25 MG TABS tablet Take 1 tablet (25 mg total) by mouth daily before breakfast. 90 tablet 1   enalapril (VASOTEC) 2.5 MG tablet Take 1 tablet (2.5 mg total) by  mouth daily. 90 tablet 3   fluticasone (FLONASE) 50 MCG/ACT nasal spray Place 1 spray into both nostrils daily. (Patient not taking: Reported on 08/10/2023) 16 mL 2   meclizine (ANTIVERT) 25 MG tablet Take 1 tablet (25 mg total) by mouth 3 (three) times daily as needed for dizziness. 30 tablet 1   Melatonin 5 MG TABS Take 5 mg by mouth at bedtime.      tamsulosin (FLOMAX) 0.4 MG CAPS capsule TAKE 1 CAPSULE EVERY DAY 90 capsule 1   traZODone (DESYREL) 100 MG tablet TAKE 1 TABLET AT BEDTIME 90 tablet 3   warfarin (COUMADIN) 5 MG tablet Take 5 mg alternating with 7.5 mg daily 30 tablet 2   No current facility-administered medications for this visit.    Allergies:   Patient has no known allergies.    Social History:   reports that he quit smoking about 60 years ago. His smoking use included cigarettes. He started smoking about 67 years ago. He has a 3.5 pack-year smoking history. He has never used smokeless tobacco. He reports current alcohol use. He reports that he does not use drugs.   Family History:  family history includes Heart disease in his father  and mother.    ROS:     Review of Systems  Constitutional: Negative.   HENT: Negative.    Eyes: Negative.   Respiratory: Negative.    Gastrointestinal: Negative.   Genitourinary: Negative.   Musculoskeletal: Negative.   Skin: Negative.   Neurological: Negative.   Endo/Heme/Allergies: Negative.   Psychiatric/Behavioral: Negative.    All other systems reviewed and are negative.     All other systems are reviewed and negative.    PHYSICAL EXAM: VS:  BP 110/60   Pulse 85   Ht 5\' 6"  (1.676 m)   Wt 157 lb (71.2 kg)   SpO2 96%   BMI 25.34 kg/m  , BMI Body mass index is 25.34 kg/m. Last weight:  Wt Readings from Last 3 Encounters:  09/24/23 157 lb (71.2 kg)  08/10/23 162 lb 12.8 oz (73.8 kg)  07/13/23 168 lb (76.2 kg)     Physical Exam Vitals reviewed.  Constitutional:      Appearance: Normal appearance. He is normal  weight.  HENT:     Head: Normocephalic.     Nose: Nose normal.     Mouth/Throat:     Mouth: Mucous membranes are moist.  Eyes:     Pupils: Pupils are equal, round, and reactive to light.  Cardiovascular:     Rate and Rhythm: Normal rate and regular rhythm.     Pulses: Normal pulses.     Heart sounds: Normal heart sounds.  Pulmonary:     Effort: Pulmonary effort is normal.  Abdominal:     General: Abdomen is flat. Bowel sounds are normal.  Musculoskeletal:        General: Normal range of motion.     Cervical back: Normal range of motion.  Skin:    General: Skin is warm.  Neurological:     General: No focal deficit present.     Mental Status: He is alert.  Psychiatric:        Mood and Affect: Mood normal.       EKG:   Recent Labs: 07/09/2023: Hemoglobin 13.5; Platelets 149 08/30/2023: ALT 25; BUN 26; Creatinine, Ser 1.38; Potassium 4.8; Sodium 142    Lipid Panel    Component Value Date/Time   CHOL 113 08/30/2023 1157   TRIG 143 08/30/2023 1157   HDL 26 (L) 08/30/2023 1157   CHOLHDL 4.3 08/30/2023 1157   LDLCALC 62 08/30/2023 1157      Other studies Reviewed: Additional studies/ records that were reviewed today include:  Review of the above records demonstrates:       No data to display            ASSESSMENT AND PLAN:    ICD-10-CM   1. Paroxysmal atrial fibrillation (HCC)  I48.0    INR 1.2    2. Essential hypertension  I10     3. Coronary artery disease involving native coronary artery of native heart without angina pectoris  I25.10     4. OSA on CPAP  G47.33     5. Mixed hyperlipidemia  E78.2     6. Atrial fibrillation, unspecified type (HCC)  I48.91 warfarin (COUMADIN) 5 MG tablet       Problem List Items Addressed This Visit       Cardiovascular and Mediastinum   Atrial fibrillation (HCC) - Primary   Relevant Medications   warfarin (COUMADIN) 5 MG tablet   Essential hypertension   Relevant Medications   warfarin (COUMADIN) 5 MG  tablet   Coronary artery  disease involving native coronary artery of native heart   Relevant Medications   warfarin (COUMADIN) 5 MG tablet     Respiratory   OSA on CPAP     Other   Mixed hyperlipidemia   Relevant Medications   warfarin (COUMADIN) 5 MG tablet       Disposition:   No follow-ups on file.    Total time spent: 30 minutes  Signed,  Adrian Blackwater, MD  09/24/2023 10:19 AM    [facility]

## 2023-10-01 ENCOUNTER — Ambulatory Visit: Payer: Medicare HMO | Admitting: Dietician

## 2023-10-08 ENCOUNTER — Other Ambulatory Visit: Payer: Medicare HMO

## 2023-10-08 ENCOUNTER — Other Ambulatory Visit: Payer: Self-pay | Admitting: Cardiovascular Disease

## 2023-10-08 DIAGNOSIS — E1165 Type 2 diabetes mellitus with hyperglycemia: Secondary | ICD-10-CM | POA: Diagnosis not present

## 2023-10-08 DIAGNOSIS — E782 Mixed hyperlipidemia: Secondary | ICD-10-CM | POA: Diagnosis not present

## 2023-10-08 DIAGNOSIS — N1831 Chronic kidney disease, stage 3a: Secondary | ICD-10-CM | POA: Diagnosis not present

## 2023-10-08 DIAGNOSIS — I4891 Unspecified atrial fibrillation: Secondary | ICD-10-CM | POA: Diagnosis not present

## 2023-10-09 LAB — HEMOGLOBIN A1C
Est. average glucose Bld gHb Est-mCnc: 151 mg/dL
Hgb A1c MFr Bld: 6.9 % — ABNORMAL HIGH (ref 4.8–5.6)

## 2023-10-09 LAB — COMPREHENSIVE METABOLIC PANEL
ALT: 26 [IU]/L (ref 0–44)
AST: 26 [IU]/L (ref 0–40)
Albumin: 4.1 g/dL (ref 3.7–4.7)
Alkaline Phosphatase: 87 [IU]/L (ref 44–121)
BUN/Creatinine Ratio: 21 (ref 10–24)
BUN: 31 mg/dL — ABNORMAL HIGH (ref 8–27)
Bilirubin Total: 0.6 mg/dL (ref 0.0–1.2)
CO2: 22 mmol/L (ref 20–29)
Calcium: 9.2 mg/dL (ref 8.6–10.2)
Chloride: 106 mmol/L (ref 96–106)
Creatinine, Ser: 1.48 mg/dL — ABNORMAL HIGH (ref 0.76–1.27)
Globulin, Total: 2.7 g/dL (ref 1.5–4.5)
Glucose: 122 mg/dL — ABNORMAL HIGH (ref 70–99)
Potassium: 4.3 mmol/L (ref 3.5–5.2)
Sodium: 143 mmol/L (ref 134–144)
Total Protein: 6.8 g/dL (ref 6.0–8.5)
eGFR: 46 mL/min/{1.73_m2} — ABNORMAL LOW (ref >59)

## 2023-10-09 LAB — LIPID PANEL
Chol/HDL Ratio: 4.3 {ratio} (ref 0.0–5.0)
Cholesterol, Total: 124 mg/dL (ref 100–199)
HDL: 29 mg/dL — ABNORMAL LOW (ref >39)
LDL Chol Calc (NIH): 71 mg/dL (ref 0–99)
Triglycerides: 136 mg/dL (ref 0–149)
VLDL Cholesterol Cal: 24 mg/dL (ref 5–40)

## 2023-10-09 LAB — PROTIME-INR
INR: 2.1 — ABNORMAL HIGH (ref 0.9–1.2)
Prothrombin Time: 22.7 s — ABNORMAL HIGH (ref 9.1–12.0)

## 2023-10-09 NOTE — Telephone Encounter (Signed)
Enter error

## 2023-10-12 ENCOUNTER — Ambulatory Visit: Payer: Medicare HMO | Admitting: Cardiovascular Disease

## 2023-10-16 ENCOUNTER — Encounter: Payer: Self-pay | Admitting: Internal Medicine

## 2023-10-16 ENCOUNTER — Ambulatory Visit (INDEPENDENT_AMBULATORY_CARE_PROVIDER_SITE_OTHER): Payer: Medicare HMO | Admitting: Internal Medicine

## 2023-10-16 VITALS — BP 120/72 | HR 76 | Temp 97.3°F | Ht 66.0 in | Wt 163.0 lb

## 2023-10-16 DIAGNOSIS — E782 Mixed hyperlipidemia: Secondary | ICD-10-CM

## 2023-10-16 DIAGNOSIS — E119 Type 2 diabetes mellitus without complications: Secondary | ICD-10-CM

## 2023-10-16 DIAGNOSIS — J301 Allergic rhinitis due to pollen: Secondary | ICD-10-CM

## 2023-10-16 DIAGNOSIS — E1165 Type 2 diabetes mellitus with hyperglycemia: Secondary | ICD-10-CM | POA: Diagnosis not present

## 2023-10-16 DIAGNOSIS — N1831 Chronic kidney disease, stage 3a: Secondary | ICD-10-CM

## 2023-10-16 DIAGNOSIS — Z0130 Encounter for examination of blood pressure without abnormal findings: Secondary | ICD-10-CM

## 2023-10-16 MED ORDER — ACCU-CHEK FASTCLIX LANCET KIT: 1.0000 | PACK | Freq: Every day | 0 refills | Status: AC

## 2023-10-16 MED ORDER — ACCU-CHEK GUIDE W/DEVICE KIT: 1.0000 | PACK | Freq: Every day | 0 refills | Status: AC

## 2023-10-16 MED ORDER — PIOGLITAZONE HCL 30 MG PO TABS: 30.0000 mg | ORAL_TABLET | Freq: Every day | ORAL | 0 refills | Status: DC

## 2023-10-16 MED ORDER — ACCU-CHEK GUIDE TEST VI STRP: ORAL_STRIP | 12 refills | Status: DC

## 2023-10-16 MED ORDER — ACCU-CHEK SOFTCLIX LANCETS MISC
12 refills | Status: AC
Start: 2023-10-16 — End: ?

## 2023-10-16 MED ORDER — FLUTICASONE PROPIONATE 50 MCG/ACT NA SUSP: 1.0000 | Freq: Every day | NASAL | 2 refills | Status: DC

## 2023-10-16 MED ORDER — PIOGLITAZONE HCL 30 MG PO TABS: 30.0000 mg | ORAL_TABLET | Freq: Every day | ORAL | 11 refills | Status: DC

## 2023-10-16 NOTE — Progress Notes (Signed)
 Established Patient Office Visit  Subjective:  Patient ID: Kyle Santos, male    DOB: 10-Dec-1935  Age: 88 y.o. MRN: [id number]  Chief Complaint  Patient presents with   Follow-up    3 month follow up with labs prior     No new complaints, here for lab review and medication refills. Labs reviewed and notable for well controlled diabetes, A1c now at target, lipids at target with cmp notable for stable CKD 3. Denies any hypoglycemic episodes and home bg readings have been at target. States he can no longer afford Jardiance.    No other concerns at this time.   Past Medical History:  Diagnosis Date   A-fib [facility])    Arthritis    BPH (benign prostatic hyperplasia)    Depression    Heart murmur    HOH (hard of hearing)    Hypercholesteremia    Hypertension    Loss of equilibrium    being evaluated at this time.   Pneumonia    history of   Sleep apnea     Past Surgical History:  Procedure Laterality Date   CATARACT EXTRACTION W/PHACO Right 01/09/2017   Procedure: CATARACT EXTRACTION PHACO AND INTRAOCULAR LENS PLACEMENT (IOC);  Surgeon: Galen Manila, MD;  Location: [facility] ORS;  Service: Ophthalmology;  Laterality: Right;  [place] 01:06 AP% 19.7 CDE 13.18 Fluid pack lot # [id number]   CATARACT EXTRACTION W/PHACO Left 08/07/2017   Procedure: CATARACT EXTRACTION PHACO AND INTRAOCULAR LENS PLACEMENT (IOC)-LEFT;  Surgeon: Galen Manila, MD;  Location: [facility] OR[facility];  Service: Ophthalmology;  Laterality: Left;  [place]  00:43 AP% 15.0 CDE 6.50 Fluid pack lot # [id number]   COLONOSCOPY     JOINT REPLACEMENT     knee   JOINT REPLACEMENT     shoulder    Social History   Socioeconomic History   Marital status: Married    Spouse name: Bonita Quin   Number of children: 3   Years of education: College Degree   Highest education level: Bachelor'[employer] degree (e.g., BA, AB, BS)  Occupational History   Occupation: Retired  Tobacco Use   Smoking status: Former    Current packs/day: 0.00     Average packs/day: 0.5 packs/day for 7.0 years (3.5 ttl pk-yrs)    Types: Cigarettes    Start date: 08/21/1956    Quit date: 08/22/1963    Years since quitting: 60.1   Smokeless tobacco: Never  Vaping Use   Vaping status: Never Used  Substance and Sexual Activity   Alcohol use: Yes    Alcohol/week: 0.0 standard drinks of alcohol    Comment: occasional   Drug use: No   Sexual activity: Not Currently    Birth control/protection: None  Other Topics Concern   Not on file  Social History Narrative   Drinks about 2 cups of caffeine.   Social Drivers of Corporate investment banker Strain: Low Risk  (06/01/2021)   Received from [facility], [facility]   Overall Financial Resource Strain (CARDIA)    Difficulty of Paying Living Expenses: Not hard at all  Recent Concern: Financial Resource Strain - Medium Risk (05/13/2021)   Overall Financial Resource Strain (CARDIA)    Difficulty of Paying Living Expenses: Somewhat hard  Food Insecurity: No Food Insecurity (06/01/2021)   Received from [facility]   Hunger Vital Sign  Transportation Needs: No Transportation Needs (06/01/2021)   Received from [facility], [facility]   [facility] - Transportation  Lack of Transportation (Medical): No    Lack of Transportation (Non-Medical): No  Physical Activity: Sufficiently Active (05/13/2021)   Exercise Vital Sign    Days of Exercise per Week: 3 days    Minutes of Exercise per Session: 150+ min  Stress: Not on file  Social Connections: Socially Integrated (05/13/2021)   Social Connection and Isolation Panel [NHANES]    Frequency of Communication with Friends and Family: More than three times a week    Frequency of Social Gatherings with Friends and Family: Once a week    Attends Religious Services: More than 4 times per year    Active Member of [employer] or Organizations: Yes    Attends Engineer, structural: More than 4 times per year    Marital Status: Married  Careers information officer Violence: Not At Risk (05/13/2021)   Humiliation, Afraid, Rape, and Kick questionnaire    Fear of Current or Ex-Partner: No    Emotionally Abused: No    Physically Abused: No    Sexually Abused: No    Family History  Problem Relation Age of Onset   Heart disease Mother    Heart disease Father     No Known Allergies  Outpatient Medications Prior to Visit  Medication Sig   atorvastatin (LIPITOR) 80 MG tablet TAKE 1 TABLET EVERY DAY   B Complex Vitamins (B COMPLEX-B12 PO) Take 1 tablet by mouth daily.   Cascara Sagrada 450 MG CAPS Take 450 mg by mouth 2 (two) times daily.   Cholecalciferol (VITAMIN D3) 1000 units CAPS Take 1,000 Units by mouth daily at 3 pm.   empagliflozin (JARDIANCE) 25 MG TABS tablet Take 1 tablet (25 mg total) by mouth daily before breakfast.   enalapril (VASOTEC) 2.5 MG tablet Take 1 tablet (2.5 mg total) by mouth daily.   meclizine (ANTIVERT) 25 MG tablet Take 1 tablet (25 mg total) by mouth 3 (three) times daily as needed for dizziness.   Melatonin 5 MG TABS Take 5 mg by mouth at bedtime.    tamsulosin (FLOMAX) 0.4 MG CAPS capsule TAKE 1 CAPSULE EVERY DAY   traZODone (DESYREL) 100 MG tablet TAKE 1 TABLET AT BEDTIME   warfarin (COUMADIN) 5 MG tablet Take 5 mg alternating with 7.5 mg daily   [DISCONTINUED] fluticasone (FLONASE) 50 MCG/ACT nasal spray Place 1 spray into both nostrils daily. (Patient not taking: Reported on 08/10/2023)   No facility-administered medications prior to visit.    Review of Systems  Constitutional: Negative.   HENT: Negative.    Eyes: Negative.   Respiratory:  Positive for cough.   Cardiovascular: Negative.   Gastrointestinal: Negative.   Genitourinary: Negative.   Skin: Negative.   Neurological: Negative.   Endo/Heme/Allergies: Negative.        Objective:   BP 120/72   Pulse 76   Temp (!) 97.3 F (36.3 C)   Ht 5\' 6"  (1.676 m)   Wt 163 lb (73.9 kg)   SpO2 97%   BMI 26.31 kg/m   Vitals:   10/16/23 0945   BP: 120/72  Pulse: 76  Temp: (!) 97.3 F (36.3 C)  Height: 5\' 6"  (1.676 m)  Weight: 163 lb (73.9 kg)  SpO2: 97%  BMI (Calculated): 26.32    Physical Exam Vitals reviewed.  Constitutional:      Appearance: Normal appearance.  HENT:     Head: Normocephalic.     Right Ear: Hearing normal. No drainage. Tympanic membrane is not injected.     Left Ear:  Hearing normal. No drainage. There is no impacted cerumen. Tympanic membrane is not injected.     Ears:     Comments: Dull TM    Nose: Nose normal.     Mouth/Throat:     Mouth: Mucous membranes are moist.     Pharynx: No posterior oropharyngeal erythema.  Eyes:     Extraocular Movements: Extraocular movements intact.     Pupils: Pupils are equal, round, and reactive to light.  Cardiovascular:     Rate and Rhythm: Regular rhythm.     Chest Wall: PMI is not displaced.     Pulses: Normal pulses.     Heart sounds: Normal heart sounds. No murmur heard. Pulmonary:     Effort: Pulmonary effort is normal.     Breath sounds: Normal air entry. No rhonchi or rales.  Abdominal:     General: Abdomen is flat. Bowel sounds are normal. There is no distension.     Palpations: Abdomen is soft. There is no hepatomegaly, splenomegaly or mass.     Tenderness: There is no abdominal tenderness.  Musculoskeletal:        General: Normal range of motion.     Cervical back: Normal range of motion and neck supple.     Right lower leg: No edema.     Left lower leg: No edema.  Skin:    General: Skin is warm and dry.  Neurological:     General: No focal deficit present.     Mental Status: He is alert and oriented to person, place, and time.     Cranial Nerves: No cranial nerve deficit.     Motor: No weakness.  Psychiatric:        Mood and Affect: Mood normal.        Behavior: Behavior normal.      No results found for any visits on 10/16/23.  Recent Results (from the past 2160 hours)  Protime-INR     Status: Abnormal   Collection Time:  07/30/23  9:46 AM  Result Value Ref Range   INR 3.0 (H) 0.9 - 1.2    Comment: Reference interval is for non-anticoagulated patients. Suggested INR therapeutic range for Vitamin K antagonist therapy:    Standard Dose (moderate intensity                   therapeutic range):       2.0 - 3.0    Higher intensity therapeutic range       2.5 - 3.5    Prothrombin Time 30.9 (H) 9.1 - 12.0 sec  Comprehensive metabolic panel     Status: Abnormal   Collection Time: 07/30/23 10:19 AM  Result Value Ref Range   Glucose 112 (H) 70 - 99 mg/dL   BUN 22 8 - 27 mg/dL   Creatinine, Ser 4.09 (H) 0.76 - 1.27 mg/dL   eGFR 46 (L) >81 XB/JYN/8.29   BUN/Creatinine Ratio 15 10 - 24   Sodium 142 134 - 144 mmol/L   Potassium 4.8 3.5 - 5.2 mmol/L   Chloride 105 96 - 106 mmol/L   CO2 22 20 - 29 mmol/L   Calcium 9.4 8.6 - 10.2 mg/dL   Total Protein 7.0 6.0 - 8.5 g/dL   Albumin 4.4 3.7 - 4.7 g/dL   Globulin, Total 2.6 1.5 - 4.5 g/dL   Bilirubin Total 0.8 0.0 - 1.2 mg/dL   Alkaline Phosphatase 82 44 - 121 IU/L   AST 26 0 - 40 IU/L  ALT 26 0 - 44 IU/L  Hemoglobin A1c     Status: Abnormal   Collection Time: 07/30/23 10:19 AM  Result Value Ref Range   Hgb A1c MFr Bld 7.2 (H) 4.8 - 5.6 %    Comment:          Prediabetes: 5.7 - 6.4          Diabetes: >6.4          Glycemic control for adults with diabetes: <7.0    Est. average glucose Bld gHb Est-mCnc 160 mg/dL  Lipid panel     Status: Abnormal   Collection Time: 07/30/23 10:19 AM  Result Value Ref Range   Cholesterol, Total 124 100 - 199 mg/dL   Triglycerides 161 (H) 0 - 149 mg/dL   HDL 28 (L) >09 mg/dL   VLDL Cholesterol Cal 30 5 - 40 mg/dL   LDL Chol Calc (NIH) 66 0 - 99 mg/dL   Chol/HDL Ratio 4.4 0.0 - 5.0 ratio    Comment:                                   T. Chol/HDL Ratio                                             Men  Women                               1/2 Avg.Risk  3.4    3.3                                   Avg.Risk  5.0    4.4                                 2X Avg.Risk  9.6    7.1                                3X Avg.Risk 23.4   11.0   Protime-INR     Status: Abnormal   Collection Time: 08/10/23 11:46 AM  Result Value Ref Range   INR 1.7 (H) 0.9 - 1.2    Comment: Reference interval is for non-anticoagulated patients. Suggested INR therapeutic range for Vitamin K antagonist therapy:    Standard Dose (moderate intensity                   therapeutic range):       2.0 - 3.0    Higher intensity therapeutic range       2.5 - 3.5    Prothrombin Time 18.5 (H) 9.1 - 12.0 sec  Protime-INR     Status: Abnormal   Collection Time: 08/30/23 11:56 AM  Result Value Ref Range   INR 1.6 (H) 0.9 - 1.2    Comment: Reference interval is for non-anticoagulated patients. Suggested INR therapeutic range for Vitamin K antagonist therapy:    Standard Dose (moderate intensity                   therapeutic range):  2.0 - 3.0    Higher intensity therapeutic range       2.5 - 3.5    Prothrombin Time 17.5 (H) 9.1 - 12.0 sec  Comprehensive metabolic panel     Status: Abnormal   Collection Time: 08/30/23 11:57 AM  Result Value Ref Range   Glucose 153 (H) 70 - 99 mg/dL   BUN 26 8 - 27 mg/dL   Creatinine, Ser 8.29 (H) 0.76 - 1.27 mg/dL   eGFR 50 (L) >56 OZ/HYQ/6.57   BUN/Creatinine Ratio 19 10 - 24   Sodium 142 134 - 144 mmol/L   Potassium 4.8 3.5 - 5.2 mmol/L   Chloride 106 96 - 106 mmol/L   CO2 21 20 - 29 mmol/L   Calcium 9.7 8.6 - 10.2 mg/dL   Total Protein 7.2 6.0 - 8.5 g/dL   Albumin 4.2 3.7 - 4.7 g/dL   Globulin, Total 3.0 1.5 - 4.5 g/dL   Bilirubin Total 1.1 0.0 - 1.2 mg/dL   Alkaline Phosphatase 113 44 - 121 IU/L   AST 26 0 - 40 IU/L   ALT 25 0 - 44 IU/L  Hemoglobin A1c     Status: Abnormal   Collection Time: 08/30/23 11:57 AM  Result Value Ref Range   Hgb A1c MFr Bld 7.0 (H) 4.8 - 5.6 %    Comment:          Prediabetes: 5.7 - 6.4          Diabetes: >6.4          Glycemic control for adults with diabetes: <7.0     Est. average glucose Bld gHb Est-mCnc 154 mg/dL  Lipid panel     Status: Abnormal   Collection Time: 08/30/23 11:57 AM  Result Value Ref Range   Cholesterol, Total 113 100 - 199 mg/dL   Triglycerides 846 0 - 149 mg/dL   HDL 26 (L) >96 mg/dL   VLDL Cholesterol Cal 25 5 - 40 mg/dL   LDL Chol Calc (NIH) 62 0 - 99 mg/dL   Chol/HDL Ratio 4.3 0.0 - 5.0 ratio    Comment:                                   T. Chol/HDL Ratio                                             Men  Women                               1/2 Avg.Risk  3.4    3.3                                   Avg.Risk  5.0    4.4                                2X Avg.Risk  9.6    7.1                                3X Avg.Risk 23.4  11.0   Protime-INR     Status: Abnormal   Collection Time: 09/13/23 11:23 AM  Result Value Ref Range   INR 1.2 0.9 - 1.2    Comment: Reference interval is for non-anticoagulated patients. Suggested INR therapeutic range for Vitamin K antagonist therapy:    Standard Dose (moderate intensity                   therapeutic range):       2.0 - 3.0    Higher intensity therapeutic range       2.5 - 3.5    Prothrombin Time 13.8 (H) 9.1 - 12.0 sec  Comprehensive metabolic panel     Status: Abnormal   Collection Time: 10/08/23  9:03 AM  Result Value Ref Range   Glucose 122 (H) 70 - 99 mg/dL   BUN 31 (H) 8 - 27 mg/dL   Creatinine, Ser 1.61 (H) 0.76 - 1.27 mg/dL   eGFR 46 (L) >09 UE/AVW/0.98   BUN/Creatinine Ratio 21 10 - 24   Sodium 143 134 - 144 mmol/L   Potassium 4.3 3.5 - 5.2 mmol/L   Chloride 106 96 - 106 mmol/L   CO2 22 20 - 29 mmol/L   Calcium 9.2 8.6 - 10.2 mg/dL   Total Protein 6.8 6.0 - 8.5 g/dL   Albumin 4.1 3.7 - 4.7 g/dL   Globulin, Total 2.7 1.5 - 4.5 g/dL   Bilirubin Total 0.6 0.0 - 1.2 mg/dL   Alkaline Phosphatase 87 44 - 121 IU/L   AST 26 0 - 40 IU/L   ALT 26 0 - 44 IU/L  Hemoglobin A1c     Status: Abnormal   Collection Time: 10/08/23  9:03 AM  Result Value Ref Range   Hgb A1c MFr Bld  6.9 (H) 4.8 - 5.6 %    Comment:          Prediabetes: 5.7 - 6.4          Diabetes: >6.4          Glycemic control for adults with diabetes: <7.0    Est. average glucose Bld gHb Est-mCnc 151 mg/dL  Lipid panel     Status: Abnormal   Collection Time: 10/08/23  9:03 AM  Result Value Ref Range   Cholesterol, Total 124 100 - 199 mg/dL   Triglycerides 119 0 - 149 mg/dL   HDL 29 (L) >14 mg/dL   VLDL Cholesterol Cal 24 5 - 40 mg/dL   LDL Chol Calc (NIH) 71 0 - 99 mg/dL   Chol/HDL Ratio 4.3 0.0 - 5.0 ratio    Comment:                                   T. Chol/HDL Ratio                                             Men  Women                               1/2 Avg.Risk  3.4    3.3  Avg.Risk  5.0    4.4                                2X Avg.Risk  9.6    7.1                                3X Avg.Risk 23.4   11.0   Protime-INR     Status: Abnormal   Collection Time: 10/08/23  9:03 AM  Result Value Ref Range   INR 2.1 (H) 0.9 - 1.2    Comment: Reference interval is for non-anticoagulated patients. Suggested INR therapeutic range for Vitamin K antagonist therapy:    Standard Dose (moderate intensity                   therapeutic range):       2.0 - 3.0    Higher intensity therapeutic range       2.5 - 3.5    Prothrombin Time 22.7 (H) 9.1 - 12.0 sec      Assessment & Plan:  As per problem list.Replace Jardiance with Pioglitazone due to cost. Problem List Items Addressed This Visit       Other   Mixed hyperlipidemia   Other Visit Diagnoses       CKD stage 3a, GFR 45-59 ml/min (HCC)    -  Primary     Uncontrolled type 2 diabetes mellitus with hyperglycemia (HCC)       Relevant Medications   pioglitazone (ACTOS) 30 MG tablet     Controlled type 2 diabetes mellitus without complication, without long-term current use of insulin (HCC)       Relevant Medications   pioglitazone (ACTOS) 30 MG tablet     Seasonal allergic rhinitis due to pollen        Relevant Medications   fluticasone (FLONASE) 50 MCG/ACT nasal spray       Return in about 3 months (around 01/13/2024) for awv with labs prior.   Total time spent: 20 minutes  Luna Fuse, MD  10/16/2023   This document may have been prepared by [facility] Voice Recognition software and as such may include unintentional dictation errors.

## 2023-10-22 ENCOUNTER — Ambulatory Visit: Payer: Medicare HMO | Admitting: Cardiovascular Disease

## 2023-10-22 ENCOUNTER — Ambulatory Visit: Payer: Medicare HMO | Admitting: Internal Medicine

## 2023-10-25 ENCOUNTER — Other Ambulatory Visit: Payer: Self-pay | Admitting: Cardiovascular Disease

## 2023-10-25 ENCOUNTER — Encounter: Payer: Self-pay | Admitting: Cardiovascular Disease

## 2023-10-25 ENCOUNTER — Ambulatory Visit: Payer: Medicare HMO | Admitting: Cardiovascular Disease

## 2023-10-25 ENCOUNTER — Other Ambulatory Visit: Payer: Self-pay | Admitting: Internal Medicine

## 2023-10-25 VITALS — BP 112/68 | HR 66 | Ht 66.0 in | Wt 157.0 lb

## 2023-10-25 DIAGNOSIS — I4891 Unspecified atrial fibrillation: Secondary | ICD-10-CM | POA: Diagnosis not present

## 2023-10-25 DIAGNOSIS — I2510 Atherosclerotic heart disease of native coronary artery without angina pectoris: Secondary | ICD-10-CM

## 2023-10-25 DIAGNOSIS — I952 Hypotension due to drugs: Secondary | ICD-10-CM

## 2023-10-25 DIAGNOSIS — E1165 Type 2 diabetes mellitus with hyperglycemia: Secondary | ICD-10-CM | POA: Diagnosis not present

## 2023-10-25 DIAGNOSIS — E782 Mixed hyperlipidemia: Secondary | ICD-10-CM

## 2023-10-25 DIAGNOSIS — I480 Paroxysmal atrial fibrillation: Secondary | ICD-10-CM

## 2023-10-25 DIAGNOSIS — N1831 Chronic kidney disease, stage 3a: Secondary | ICD-10-CM | POA: Diagnosis not present

## 2023-10-25 NOTE — Progress Notes (Signed)
 Cardiology Office Note   Date:  10/25/2023   ID:  Kyle Santos, DOB 1935-11-17, MRN [id number]  PCP:  Sherron Monday, MD  Cardiologist:  Adrian Blackwater, MD      History of Present Illness: Kyle Santos is a 88 y.o. male who presents for  Chief Complaint  Patient presents with   Follow-up    4 week follow up    Doing better      Past Medical History:  Diagnosis Date   A-fib [facility])    Arthritis    BPH (benign prostatic hyperplasia)    Depression    Heart murmur    HOH (hard of hearing)    Hypercholesteremia    Hypertension    Loss of equilibrium    being evaluated at this time.   Pneumonia    history of   Sleep apnea      Past Surgical History:  Procedure Laterality Date   CATARACT EXTRACTION W/PHACO Right 01/09/2017   Procedure: CATARACT EXTRACTION PHACO AND INTRAOCULAR LENS PLACEMENT (IOC);  Surgeon: Galen Manila, MD;  Location: [facility] ORS;  Service: Ophthalmology;  Laterality: Right;  [place] 01:06 AP% 19.7 CDE 13.18 Fluid pack lot # [id number]   CATARACT EXTRACTION W/PHACO Left 08/07/2017   Procedure: CATARACT EXTRACTION PHACO AND INTRAOCULAR LENS PLACEMENT (IOC)-LEFT;  Surgeon: Galen Manila, MD;  Location: [facility] ORS;  Service: Ophthalmology;  Laterality: Left;  [place]  00:43 AP% 15.0 CDE 6.50 Fluid pack lot # [id number]   COLONOSCOPY     JOINT REPLACEMENT     knee   JOINT REPLACEMENT     shoulder     Current Outpatient Medications  Medication Sig Dispense Refill   PREVNAR 20 0.5 ML injection Inject 0.5 mLs into the muscle once.     Accu-Chek Softclix Lancets lancets Use as instructed 100 each 12   atorvastatin (LIPITOR) 80 MG tablet TAKE 1 TABLET EVERY DAY 90 tablet 3   B Complex Vitamins (B COMPLEX-B12 PO) Take 1 tablet by mouth daily.     Blood Glucose Monitoring Suppl (ACCU-CHEK GUIDE) w/Device KIT 1 Device by Does not apply route daily. 1 kit 0   Casc[place] 450 MG CAPS Take 450 mg by mouth 2 (two) times daily.     Cholecalciferol  (VITAMIN D3) 1000 units CAPS Take 1,000 Units by mouth daily at 3 pm.     empagliflozin (JARDIANCE) 25 MG TABS tablet Take 1 tablet (25 mg total) by mouth daily before breakfast. 90 tablet 1   enalapril (VASOTEC) 2.5 MG tablet Take 1 tablet (2.5 mg total) by mouth daily. 90 tablet 3   fluticasone (FLONASE) 50 MCG/ACT nasal spray Place 1 spray into both nostrils daily. 16 mL 2   glucose blood (ACCU-CHEK GUIDE TEST) test strip Use as instructed 100 each 12   Lancets Misc. (ACCU-CHEK FASTCLIX LANCET) KIT 1 Device by Does not apply route daily. 1 kit 0   meclizine (ANTIVERT) 25 MG tablet Take 1 tablet (25 mg total) by mouth 3 (three) times daily as needed for dizziness. 30 tablet 1   Melatonin 5 MG TABS Take 5 mg by mouth at bedtime.      pioglitazone (ACTOS) 30 MG tablet Take 1 tablet (30 mg total) by mouth daily. 90 tablet 0   tamsulosin (FLOMAX) 0.4 MG CAPS capsule TAKE 1 CAPSULE EVERY DAY 90 capsule 1   traZODone (DESYREL) 100 MG tablet TAKE 1 TABLET AT BEDTIME 90 tablet 3   warfarin (COUMADIN) 5 MG tablet  Take 5 mg alternating with 7.5 mg daily 30 tablet 2   No current facility-administered medications for this visit.    Allergies:   Patient has no known allergies.    Social History:   reports that he quit smoking about 60 years ago. His smoking use included cigarettes. He started smoking about 67 years ago. He has a 3.5 pack-year smoking history. He has never used smokeless tobacco. He reports current alcohol use. He reports that he does not use drugs.   Family History:  family history includes Heart disease in his father and mother.    ROS:     Review of Systems  Constitutional: Negative.   HENT: Negative.    Eyes: Negative.   Respiratory: Negative.    Gastrointestinal: Negative.   Genitourinary: Negative.   Musculoskeletal: Negative.   Skin: Negative.   Neurological: Negative.   Endo/Heme/Allergies: Negative.   Psychiatric/Behavioral: Negative.    All other systems reviewed  and are negative.     All other systems are reviewed and negative.    PHYSICAL EXAM: VS:  BP 112/68   Pulse 66   Ht 5\' 6"  (1.676 m)   Wt 157 lb (71.2 kg)   SpO2 98%   BMI 25.34 kg/m  , BMI Body mass index is 25.34 kg/m. Last weight:  Wt Readings from Last 3 Encounters:  10/25/23 157 lb (71.2 kg)  10/16/23 163 lb (73.9 kg)  09/24/23 157 lb (71.2 kg)     Physical Exam Vitals reviewed.  Constitutional:      Appearance: Normal appearance. He is normal weight.  HENT:     Head: Normocephalic.     Nose: Nose normal.     Mouth/Throat:     Mouth: Mucous membranes are moist.  Eyes:     Pupils: Pupils are equal, round, and reactive to light.  Cardiovascular:     Rate and Rhythm: Normal rate and regular rhythm.     Pulses: Normal pulses.     Heart sounds: Normal heart sounds.  Pulmonary:     Effort: Pulmonary effort is normal.  Abdominal:     General: Abdomen is flat. Bowel sounds are normal.  Musculoskeletal:        General: Normal range of motion.     Cervical back: Normal range of motion.  Skin:    General: Skin is warm.  Neurological:     General: No focal deficit present.     Mental Status: He is alert.  Psychiatric:        Mood and Affect: Mood normal.       EKG:   Recent Labs: 07/09/2023: Hemoglobin 13.5; Platelets 149 10/08/2023: ALT 26; BUN 31; Creatinine, Ser 1.48; Potassium 4.3; Sodium 143    Lipid Panel    Component Value Date/Time   CHOL 124 10/08/2023 0903   TRIG 136 10/08/2023 0903   HDL 29 (L) 10/08/2023 0903   CHOLHDL 4.3 10/08/2023 0903   LDLCALC 71 10/08/2023 0903      Other studies Reviewed: Additional studies/ records that were reviewed today include:  Review of the above records demonstrates:       No data to display            ASSESSMENT AND PLAN:    ICD-10-CM   1. Hypotension due to drugs  I95.2 PCV ECHOCARDIOGRAM COMPLETE   Bp improved    2. Paroxysmal atrial fibrillation (HCC)  I48.0 PCV ECHOCARDIOGRAM COMPLETE    in controlled rate    3. Coronary artery  disease involving native coronary artery of native heart without angina pectoris  I25.10 PCV ECHOCARDIOGRAM COMPLETE    4. Mixed hyperlipidemia  E78.2 PCV ECHOCARDIOGRAM COMPLETE       Problem List Items Addressed This Visit       Cardiovascular and Mediastinum   Hypotension due to drugs - Primary   Relevant Orders   PCV ECHOCARDIOGRAM COMPLETE   Atrial fibrillation (HCC)   Relevant Orders   PCV ECHOCARDIOGRAM COMPLETE   Coronary artery disease involving native coronary artery of native heart   Relevant Orders   PCV ECHOCARDIOGRAM COMPLETE     Other   Mixed hyperlipidemia   Relevant Orders   PCV ECHOCARDIOGRAM COMPLETE       Disposition:   Return in about 2 months (around 12/25/2023) for echo and f/u.    Total time spent: 30 minutes  Signed,  Adrian Blackwater, MD  10/25/2023 10:57 AM    [facility]

## 2023-10-26 LAB — LIPID PANEL
Chol/HDL Ratio: 4.7 ratio (ref 0.0–5.0)
Cholesterol, Total: 123 mg/dL (ref 100–199)
HDL: 26 mg/dL — ABNORMAL LOW (ref >39)
LDL Chol Calc (NIH): 58 mg/dL (ref 0–99)
Triglycerides: 244 mg/dL — ABNORMAL HIGH (ref 0–149)
VLDL Cholesterol Cal: 39 mg/dL (ref 5–40)

## 2023-10-26 LAB — COMPREHENSIVE METABOLIC PANEL
ALT: 26 IU/L (ref 0–44)
AST: 30 IU/L (ref 0–40)
Albumin: 4.3 g/dL (ref 3.7–4.7)
Alkaline Phosphatase: 90 IU/L (ref 44–121)
BUN/Creatinine Ratio: 17 (ref 10–24)
BUN: 26 mg/dL (ref 8–27)
Bilirubin Total: 0.7 mg/dL (ref 0.0–1.2)
CO2: 20 mmol/L (ref 20–29)
Calcium: 9.8 mg/dL (ref 8.6–10.2)
Chloride: 105 mmol/L (ref 96–106)
Creatinine, Ser: 1.5 mg/dL — ABNORMAL HIGH (ref 0.76–1.27)
Globulin, Total: 2.7 g/dL (ref 1.5–4.5)
Glucose: 129 mg/dL — ABNORMAL HIGH (ref 70–99)
Potassium: 4.9 mmol/L (ref 3.5–5.2)
Sodium: 143 mmol/L (ref 134–144)
Total Protein: 7 g/dL (ref 6.0–8.5)
eGFR: 45 mL/min/{1.73_m2} — ABNORMAL LOW (ref >59)

## 2023-10-26 LAB — PROTIME-INR
INR: 1.6 — ABNORMAL HIGH (ref 0.9–1.2)
Prothrombin Time: 16.9 s — ABNORMAL HIGH (ref 9.1–12.0)

## 2023-10-26 LAB — HEMOGLOBIN A1C
Est. average glucose Bld gHb Est-mCnc: 148 mg/dL
Hgb A1c MFr Bld: 6.8 % — ABNORMAL HIGH (ref 4.8–5.6)

## 2023-11-07 ENCOUNTER — Ambulatory Visit (INDEPENDENT_AMBULATORY_CARE_PROVIDER_SITE_OTHER)

## 2023-11-07 DIAGNOSIS — I361 Nonrheumatic tricuspid (valve) insufficiency: Secondary | ICD-10-CM

## 2023-11-07 DIAGNOSIS — I371 Nonrheumatic pulmonary valve insufficiency: Secondary | ICD-10-CM | POA: Diagnosis not present

## 2023-11-07 DIAGNOSIS — I2510 Atherosclerotic heart disease of native coronary artery without angina pectoris: Secondary | ICD-10-CM

## 2023-11-07 DIAGNOSIS — I340 Nonrheumatic mitral (valve) insufficiency: Secondary | ICD-10-CM | POA: Diagnosis not present

## 2023-11-07 DIAGNOSIS — I952 Hypotension due to drugs: Secondary | ICD-10-CM

## 2023-11-07 DIAGNOSIS — E782 Mixed hyperlipidemia: Secondary | ICD-10-CM

## 2023-11-07 DIAGNOSIS — I480 Paroxysmal atrial fibrillation: Secondary | ICD-10-CM

## 2023-11-12 DIAGNOSIS — Z961 Presence of intraocular lens: Secondary | ICD-10-CM | POA: Diagnosis not present

## 2023-11-12 DIAGNOSIS — E119 Type 2 diabetes mellitus without complications: Secondary | ICD-10-CM | POA: Diagnosis not present

## 2023-11-12 DIAGNOSIS — H43813 Vitreous degeneration, bilateral: Secondary | ICD-10-CM | POA: Diagnosis not present

## 2023-11-20 ENCOUNTER — Other Ambulatory Visit: Payer: Self-pay

## 2023-11-20 DIAGNOSIS — E119 Type 2 diabetes mellitus without complications: Secondary | ICD-10-CM

## 2023-11-20 MED ORDER — ACCU-CHEK GUIDE TEST VI STRP: ORAL_STRIP | 12 refills | Status: AC

## 2023-11-26 ENCOUNTER — Other Ambulatory Visit: Payer: Self-pay | Admitting: Cardiovascular Disease

## 2023-11-26 DIAGNOSIS — I4891 Unspecified atrial fibrillation: Secondary | ICD-10-CM

## 2023-12-14 ENCOUNTER — Other Ambulatory Visit: Payer: Self-pay | Admitting: Internal Medicine

## 2023-12-14 ENCOUNTER — Other Ambulatory Visit

## 2023-12-14 ENCOUNTER — Other Ambulatory Visit: Payer: Self-pay | Admitting: Cardiovascular Disease

## 2023-12-14 DIAGNOSIS — I4891 Unspecified atrial fibrillation: Secondary | ICD-10-CM | POA: Diagnosis not present

## 2023-12-14 DIAGNOSIS — E782 Mixed hyperlipidemia: Secondary | ICD-10-CM | POA: Diagnosis not present

## 2023-12-14 DIAGNOSIS — E1165 Type 2 diabetes mellitus with hyperglycemia: Secondary | ICD-10-CM | POA: Diagnosis not present

## 2023-12-14 DIAGNOSIS — N1831 Chronic kidney disease, stage 3a: Secondary | ICD-10-CM | POA: Diagnosis not present

## 2023-12-15 LAB — PROTIME-INR
INR: 2.4 — ABNORMAL HIGH (ref 0.9–1.2)
Prothrombin Time: 25.6 s — ABNORMAL HIGH (ref 9.1–12.0)

## 2023-12-15 LAB — LIPID PANEL

## 2023-12-15 LAB — COMPREHENSIVE METABOLIC PANEL WITH GFR

## 2023-12-15 LAB — HEMOGLOBIN A1C
Est. average glucose Bld gHb Est-mCnc: 157 mg/dL
Hgb A1c MFr Bld: 7.1 % — ABNORMAL HIGH (ref 4.8–5.6)

## 2023-12-17 ENCOUNTER — Encounter: Payer: Self-pay | Admitting: Internal Medicine

## 2023-12-28 ENCOUNTER — Other Ambulatory Visit

## 2023-12-28 ENCOUNTER — Other Ambulatory Visit: Payer: Self-pay | Admitting: Cardiovascular Disease

## 2023-12-28 DIAGNOSIS — I4891 Unspecified atrial fibrillation: Secondary | ICD-10-CM | POA: Diagnosis not present

## 2023-12-29 LAB — PROTIME-INR
INR: 2.1 — ABNORMAL HIGH (ref 0.9–1.2)
Prothrombin Time: 22.2 s — ABNORMAL HIGH (ref 9.1–12.0)

## 2024-01-04 ENCOUNTER — Ambulatory Visit: Admitting: Cardiovascular Disease

## 2024-01-04 ENCOUNTER — Encounter: Payer: Self-pay | Admitting: Cardiovascular Disease

## 2024-01-04 VITALS — BP 130/78 | HR 66 | Ht 66.0 in | Wt 164.8 lb

## 2024-01-04 DIAGNOSIS — N1831 Chronic kidney disease, stage 3a: Secondary | ICD-10-CM

## 2024-01-04 DIAGNOSIS — I952 Hypotension due to drugs: Secondary | ICD-10-CM

## 2024-01-04 DIAGNOSIS — E1165 Type 2 diabetes mellitus with hyperglycemia: Secondary | ICD-10-CM

## 2024-01-04 DIAGNOSIS — E782 Mixed hyperlipidemia: Secondary | ICD-10-CM

## 2024-01-04 DIAGNOSIS — I2510 Atherosclerotic heart disease of native coronary artery without angina pectoris: Secondary | ICD-10-CM | POA: Diagnosis not present

## 2024-01-04 DIAGNOSIS — I480 Paroxysmal atrial fibrillation: Secondary | ICD-10-CM | POA: Diagnosis not present

## 2024-01-04 NOTE — Progress Notes (Signed)
 Cardiology Office Note   Date:  01/04/2024   ID:  Kyle Santos, DOB May 03, 1936, MRN [id number]  PCP:  Shari Daughters, MD  Cardiologist:  Debborah Fairly, MD      History of Present Illness: Kyle Santos is a 88 y.o. male who presents for  Chief Complaint  Patient presents with   Follow-up    2 month eco follow up results    Still unsteady      Past Medical History:  Diagnosis Date   A-fib [facility])    Arthritis    BPH (benign prostatic hyperplasia)    Depression    Heart murmur    HOH (hard of hearing)    Hypercholesteremia    Hypertension    Loss of equilibrium    being evaluated at this time.   Pneumonia    history of   Sleep apnea      Past Surgical History:  Procedure Laterality Date   CATARACT EXTRACTION W/PHACO Right 01/09/2017   Procedure: CATARACT EXTRACTION PHACO AND INTRAOCULAR LENS PLACEMENT (IOC);  Surgeon: Clair Crews, MD;  Location: [facility] ORS;  Service: Ophthalmology;  Laterality: Right;  [place]  01:06 AP% 19.7 CDE 13.18 Fluid pack lot # [id number]   CATARACT EXTRACTION W/PHACO Left 08/07/2017   Procedure: CATARACT EXTRACTION PHACO AND INTRAOCULAR LENS PLACEMENT (IOC)-LEFT;  Surgeon: Clair Crews, MD;  Location: [facility] ORS;  Service: Ophthalmology;  Laterality: Left;  [place]   00:43 AP% 15.0 CDE 6.50 Fluid pack lot # [id number]   COLONOSCOPY     JOINT REPLACEMENT     knee   JOINT REPLACEMENT     shoulder     Current Outpatient Medications  Medication Sig Dispense Refill   Accu-Chek Softclix Lancets lancets Use as instructed 100 each 12   atorvastatin  (LIPITOR) 80 MG tablet TAKE 1 TABLET EVERY DAY 90 tablet 3   B Complex Vitamins (B COMPLEX-B12 PO) Take 1 tablet by mouth daily.     Cascara [place] 450 MG CAPS Take 450 mg by mouth 2 (two) times daily.     Cholecalciferol (VITAMIN D3) 1000 units CAPS Take 1,000 Units by mouth daily at 3 pm.     enalapril  (VASOTEC ) 2.5 MG tablet Take 1 tablet (2.5 mg total) by mouth daily. 90 tablet 3    fluticasone  (FLONASE ) 50 MCG/ACT nasal spray Place 1 spray into both nostrils daily. 16 mL 2   glucose blood (ACCU-CHEK GUIDE TEST) test strip Use to check blood sugar TID (3 times daily) 100 each 12   Melatonin 5 MG TABS Take 5 mg by mouth at bedtime.      pioglitazone  (ACTOS ) 30 MG tablet Take 1 tablet (30 mg total) by mouth daily. 90 tablet 0   tamsulosin  (FLOMAX ) 0.4 MG CAPS capsule TAKE 1 CAPSULE EVERY DAY 90 capsule 1   traZODone  (DESYREL ) 100 MG tablet TAKE 1 TABLET AT BEDTIME 90 tablet 3   warfarin (COUMADIN ) 5 MG tablet TAKE 5MG  ALTERNATING WITH 7.5MG  DAILY 35 tablet 0   empagliflozin  (JARDIANCE ) 25 MG TABS tablet Take 1 tablet (25 mg total) by mouth daily before breakfast. (Patient not taking: Reported on 01/04/2024) 90 tablet 1   meclizine  (ANTIVERT ) 25 MG tablet Take 1 tablet (25 mg total) by mouth 3 (three) times daily as needed for dizziness. 30 tablet 1   PREVNAR 20 0.5 ML injection Inject 0.5 mLs into the muscle once. (Patient not taking: Reported on 01/04/2024)     No current facility-administered medications for this visit.  Allergies:   Patient has no known allergies.    Social History:   reports that he quit smoking about 60 years ago. His smoking use included cigarettes. He started smoking about 67 years ago. He has a 3.5 pack-year smoking history. He has never used smokeless tobacco. He reports current alcohol use. He reports that he does not use drugs.   Family History:  family history includes Heart disease in his father and mother.    ROS:     Review of Systems  Constitutional: Negative.   HENT: Negative.    Eyes: Negative.   Respiratory: Negative.    Gastrointestinal: Negative.   Genitourinary: Negative.   Musculoskeletal: Negative.   Skin: Negative.   Neurological: Negative.   Endo/Heme/Allergies: Negative.   Psychiatric/Behavioral: Negative.    All other systems reviewed and are negative.     All other systems are reviewed and negative.     PHYSICAL EXAM: VS:  BP 130/78   Pulse 66   Ht 5\' 6"  (1.676 m)   Wt 164 lb 12.8 oz (74.8 kg)   SpO2 97%   BMI 26.60 kg/m  , BMI Body mass index is 26.6 kg/m. Last weight:  Wt Readings from Last 3 Encounters:  01/04/24 164 lb 12.8 oz (74.8 kg)  10/25/23 157 lb (71.2 kg)  10/16/23 163 lb (73.9 kg)     Physical Exam Vitals reviewed.  Constitutional:      Appearance: Normal appearance. He is normal weight.  HENT:     Head: Normocephalic.     Nose: Nose normal.     Mouth/Throat:     Mouth: Mucous membranes are moist.  Eyes:     Pupils: Pupils are equal, round, and reactive to light.  Cardiovascular:     Rate and Rhythm: Normal rate and regular rhythm.     Pulses: Normal pulses.     Heart sounds: Normal heart sounds.  Pulmonary:     Effort: Pulmonary effort is normal.  Abdominal:     General: Abdomen is flat. Bowel sounds are normal.  Musculoskeletal:        General: Normal range of motion.     Cervical back: Normal range of motion.  Skin:    General: Skin is warm.  Neurological:     General: No focal deficit present.     Mental Status: He is alert.  Psychiatric:        Mood and Affect: Mood normal.       EKG:   Recent Labs: 07/09/2023: Hemoglobin 13.5; Platelets 149 12/14/2023: ALT CANCELED; BUN CANCELED; Creatinine, Ser CANCELED; Potassium CANCELED; Sodium CANCELED    Lipid Panel    Component Value Date/Time   CHOL CANCELED 12/14/2023 1103   TRIG CANCELED 12/14/2023 1103   HDL CANCELED 12/14/2023 1103   CHOLHDL CANCELED 12/14/2023 1103   LDLCALC CANCELED 12/14/2023 1103      Other studies Reviewed: Additional studies/ records that were reviewed today include:  Review of the above records demonstrates:       No data to display            ASSESSMENT AND PLAN:    ICD-10-CM   1. Hypotension due to drugs  I95.2    BP much better today only on 2.5 enalpril for CKD    2. Mixed hyperlipidemia  E78.2     3. CKD stage 3a, GFR 45-59 ml/min  (HCC)  N18.31     4. Uncontrolled type 2 diabetes mellitus with hyperglycemia (HCC)  E11.65  5. Coronary artery disease involving native coronary artery of native heart without angina pectoris  I25.10     6. Paroxysmal atrial fibrillation (HCC)  I48.0        Problem List Items Addressed This Visit       Cardiovascular and Mediastinum   Hypotension due to drugs - Primary   Atrial fibrillation (HCC)   Coronary artery disease involving native coronary artery of native heart     Other   Mixed hyperlipidemia   Other Visit Diagnoses       CKD stage 3a, GFR 45-59 ml/min (HCC)         Uncontrolled type 2 diabetes mellitus with hyperglycemia (HCC)              Disposition:   Return in about 2 months (around 03/05/2024).    Total time spent: 30 minutes  Signed,  Debborah Fairly, MD  01/04/2024 10:29 AM    [facility]

## 2024-01-21 ENCOUNTER — Other Ambulatory Visit: Payer: Self-pay | Admitting: Cardiovascular Disease

## 2024-01-21 ENCOUNTER — Other Ambulatory Visit

## 2024-01-21 DIAGNOSIS — I4891 Unspecified atrial fibrillation: Secondary | ICD-10-CM | POA: Diagnosis not present

## 2024-01-21 DIAGNOSIS — E782 Mixed hyperlipidemia: Secondary | ICD-10-CM | POA: Diagnosis not present

## 2024-01-22 ENCOUNTER — Ambulatory Visit: Payer: Self-pay

## 2024-01-22 LAB — PROTIME-INR
INR: 1.7 — ABNORMAL HIGH (ref 0.9–1.2)
Prothrombin Time: 18.2 s — ABNORMAL HIGH (ref 9.1–12.0)

## 2024-01-22 LAB — LIPID PANEL
Chol/HDL Ratio: 3.8 ratio (ref 0.0–5.0)
Cholesterol, Total: 119 mg/dL (ref 100–199)
HDL: 31 mg/dL — ABNORMAL LOW (ref >39)
LDL Chol Calc (NIH): 67 mg/dL (ref 0–99)
Triglycerides: 114 mg/dL (ref 0–149)
VLDL Cholesterol Cal: 21 mg/dL (ref 5–40)

## 2024-01-28 ENCOUNTER — Ambulatory Visit: Payer: Self-pay | Admitting: Internal Medicine

## 2024-01-28 ENCOUNTER — Ambulatory Visit (INDEPENDENT_AMBULATORY_CARE_PROVIDER_SITE_OTHER): Payer: Medicare HMO | Admitting: Internal Medicine

## 2024-01-28 VITALS — BP 122/72 | HR 60 | Temp 98.0°F | Ht 66.0 in | Wt 165.0 lb

## 2024-01-28 DIAGNOSIS — J301 Allergic rhinitis due to pollen: Secondary | ICD-10-CM

## 2024-01-28 DIAGNOSIS — N1831 Chronic kidney disease, stage 3a: Secondary | ICD-10-CM

## 2024-01-28 DIAGNOSIS — Z0000 Encounter for general adult medical examination without abnormal findings: Secondary | ICD-10-CM

## 2024-01-28 DIAGNOSIS — E782 Mixed hyperlipidemia: Secondary | ICD-10-CM | POA: Diagnosis not present

## 2024-01-28 DIAGNOSIS — Z0130 Encounter for examination of blood pressure without abnormal findings: Secondary | ICD-10-CM

## 2024-01-28 DIAGNOSIS — Z1331 Encounter for screening for depression: Secondary | ICD-10-CM | POA: Diagnosis not present

## 2024-01-28 DIAGNOSIS — E119 Type 2 diabetes mellitus without complications: Secondary | ICD-10-CM

## 2024-01-28 DIAGNOSIS — E1165 Type 2 diabetes mellitus with hyperglycemia: Secondary | ICD-10-CM | POA: Diagnosis not present

## 2024-01-28 DIAGNOSIS — Z0001 Encounter for general adult medical examination with abnormal findings: Secondary | ICD-10-CM

## 2024-01-28 MED ORDER — EMPAGLIFLOZIN 25 MG PO TABS
25.0000 mg | ORAL_TABLET | Freq: Every day | ORAL | 1 refills | Status: DC
Start: 2024-01-28 — End: 2024-04-04

## 2024-01-28 MED ORDER — PIOGLITAZONE HCL 30 MG PO TABS
30.0000 mg | ORAL_TABLET | Freq: Every day | ORAL | 0 refills | Status: DC
Start: 2024-01-28 — End: 2024-03-31

## 2024-01-28 MED ORDER — FLUTICASONE PROPIONATE 50 MCG/ACT NA SUSP: 1.0000 | Freq: Every day | NASAL | 1 refills | Status: DC

## 2024-01-28 NOTE — Progress Notes (Signed)
 Established Patient Office Visit  Subjective:  Patient ID: Kyle Santos, male    DOB: [birth year]  Age: 88 y.o. MRN: [id number]  Chief Complaint  Patient presents with   Annual Exam    75mo AWV lab results    No new complaints, here for AWV refer to quality metrics and scanned documents.   LDL and TC well controlled on lab review. Triglycerides also satisfactory. A1c not done with last lab although home bg readings on target. Not taking Jardiance  due to misunderstanding.      No other concerns at this time.   Past Medical History:  Diagnosis Date   A-fib [facility])    Arthritis    BPH (benign prostatic hyperplasia)    Depression    Heart murmur    HOH (hard of hearing)    Hypercholesteremia    Hypertension    Loss of equilibrium    being evaluated at this time.   Pneumonia    history of   Sleep apnea     Past Surgical History:  Procedure Laterality Date   CATARACT EXTRACTION W/PHACO Right 01/09/2017   Procedure: CATARACT EXTRACTION PHACO AND INTRAOCULAR LENS PLACEMENT (IOC);  Surgeon: Clair Crews, MD;  Location: [facility] ORS;  Service: Ophthalmology;  Laterality: Right;  US  01:06 AP% 19.7 CDE 13.18 Fluid pack lot # [id number]   CATARACT EXTRACTION W/PHACO Left 08/07/2017   Procedure: CATARACT EXTRACTION PHACO AND INTRAOCULAR LENS PLACEMENT (IOC)-LEFT;  Surgeon: Clair Crews, MD;  Location: [facility] ORS;  Service: Ophthalmology;  Laterality: Left;  US   00:43 AP% 15.0 CDE 6.50 Fluid pack lot # [id number]   COLONOSCOPY     JOINT REPLACEMENT     knee   JOINT REPLACEMENT     shoulder    Social History   Socioeconomic History   Marital status: Married    Spouse name: Stana Ear   Number of children: 3   Years of education: College Degree   Highest education level: Bachelor'Karthikeya Funke degree (e.g., BA, AB, BS)  Occupational History   Occupation: Retired  Tobacco Use   Smoking status: Former    Current packs/day: 0.00    Average packs/day: 0.5 packs/day for 7.0 years (3.5 ttl  pk-yrs)    Types: Cigarettes    Start date: 08/21/1956    Quit date: 08/22/1963    Years since quitting: 60.4   Smokeless tobacco: Never  Vaping Use   Vaping status: Never Used  Substance and Sexual Activity   Alcohol use: Yes    Alcohol/week: 0.0 standard drinks of alcohol    Comment: occasional   Drug use: No   Sexual activity: Not Currently    Birth control/protection: None  Other Topics Concern   Not on file  Social History Narrative   Drinks about 2 cups of caffeine.   Social Drivers of Corporate investment banker Strain: Low Risk  (06/01/2021)   [facility] from [facility]   Overall Financial Resource Strain (CARDIA)    Difficulty of Paying Living Expenses: Not hard at all  Recent Concern: Financial Resource Strain - Medium Risk (05/13/2021)   Overall Financial Resource Strain (CARDIA)    Difficulty of Paying Living Expenses: Somewhat hard  Food Insecurity: No Food Insecurity (06/01/2021)   Received from [facility]   Hunger Vital Sign  Transportation Needs: No Transportation Needs (06/01/2021)   Received from [facility]   [facility] - Transportation    Lack of Transportation (Medical): No  Lack of Transportation (Non-Medical): No  Physical Activity: Sufficiently Active (05/13/2021)   Exercise Vital Sign    Days of Exercise per Week: 3 days    Minutes of Exercise per Session: 150+ min  Stress: Not on file  Social Connections: Socially Integrated (05/13/2021)   Social Connection and Isolation Panel [NHANES]    Frequency of Communication with Friends and Family: More than three times a week    Frequency of Social Gatherings with Friends and Family: Once a week    Attends Religious Services: More than 4 times per year    Active Member of [employer] or Organizations: Yes    Attends Engineer, structural: More than 4 times per year    Marital Status: Married  Catering manager Violence: Not At Risk (05/13/2021)   Humiliation,  Afraid, Rape, and Kick questionnaire    Fear of Current or Ex-Partner: No    Emotionally Abused: No    Physically Abused: No    Sexually Abused: No    Family History  Problem Relation Age of Onset   Heart disease Mother    Heart disease Father     No Known Allergies  Outpatient Medications Prior to Visit  Medication Sig   Accu-Chek Softclix Lancets lancets Use as instructed   atorvastatin  (LIPITOR) 80 MG tablet TAKE 1 TABLET EVERY DAY   B Complex Vitamins (B COMPLEX-B12 PO) Take 1 tablet by mouth daily.   [place] 450 MG CAPS Take 450 mg by mouth 2 (two) times daily.   Cholecalciferol (VITAMIN D3) 1000 units CAPS Take 1,000 Units by mouth daily at 3 pm.   enalapril  (VASOTEC ) 2.5 MG tablet Take 1 tablet (2.5 mg total) by mouth daily.   glucose blood (ACCU-CHEK GUIDE TEST) test strip Use to check blood sugar TID (3 times daily)   meclizine  (ANTIVERT ) 25 MG tablet Take 1 tablet (25 mg total) by mouth 3 (three) times daily as needed for dizziness.   Melatonin 5 MG TABS Take 5 mg by mouth at bedtime.    PREVNAR 20 0.5 ML injection Inject 0.5 mLs into the muscle once. (Patient not taking: Reported on 01/04/2024)   tamsulosin  (FLOMAX ) 0.4 MG CAPS capsule TAKE 1 CAPSULE EVERY DAY   traZODone  (DESYREL ) 100 MG tablet TAKE 1 TABLET AT BEDTIME   warfarin (COUMADIN ) 5 MG tablet TAKE 5MG  ALTERNATING WITH 7.5MG  DAILY   [DISCONTINUED] empagliflozin  (JARDIANCE ) 25 MG TABS tablet Take 1 tablet (25 mg total) by mouth daily before breakfast. (Patient not taking: Reported on 01/04/2024)   [DISCONTINUED] fluticasone  (FLONASE ) 50 MCG/ACT nasal spray Place 1 spray into both nostrils daily.   [DISCONTINUED] pioglitazone  (ACTOS ) 30 MG tablet Take 1 tablet (30 mg total) by mouth daily.   No facility-administered medications prior to visit.    Review of Systems  Constitutional: Negative.   HENT: Negative.    Eyes: Negative.   Respiratory: Negative.    Gastrointestinal: Negative.   Genitourinary:  Negative.   Musculoskeletal: Negative.   Skin: Negative.   Neurological: Negative.   Endo/Heme/Allergies: Negative.   Psychiatric/Behavioral: Negative.    All other systems reviewed and are negative.      Objective:   BP 122/72   Pulse 60   Temp 98 F (36.7 C)   Ht 5\' 6"  (1.676 m)   Wt 165 lb (74.8 kg)   SpO2 95%   BMI 26.63 kg/m   Vitals:   01/28/24 1028  BP: 122/72  Pulse: 60  Temp: 98 F (36.7 C)  Height:  5\' 6"  (1.676 m)  Weight: 165 lb (74.8 kg)  SpO2: 95%  BMI (Calculated): 26.64    Physical Exam Vitals reviewed.  Constitutional:      Appearance: Normal appearance. He is normal weight.  HENT:     Head: Normocephalic.     Nose: Nose normal.     Mouth/Throat:     Mouth: Mucous membranes are moist.  Eyes:     Pupils: Pupils are equal, round, and reactive to light.  Cardiovascular:     Rate and Rhythm: Normal rate and regular rhythm.     Pulses: Normal pulses.     Heart sounds: Normal heart sounds.  Pulmonary:     Effort: Pulmonary effort is normal.  Abdominal:     General: Abdomen is flat. Bowel sounds are normal.  Musculoskeletal:        General: Normal range of motion.     Cervical back: Normal range of motion.  Skin:    General: Skin is warm.  Neurological:     General: No focal deficit present.     Mental Status: He is alert.  Psychiatric:        Mood and Affect: Mood normal.      No results found for any visits on 01/28/24.  Recent Results (from the past 2160 hours)  Protime-INR     Status: Abnormal   Collection Time: 12/14/23 10:54 AM  Result Value Ref Range   INR 2.4 (H) 0.9 - 1.2    Comment: Reference interval is for non-anticoagulated patients. Suggested INR therapeutic range for Vitamin K antagonist therapy:    Standard Dose (moderate intensity                   therapeutic range):       2.0 - 3.0    Higher intensity therapeutic range       2.5 - 3.5    Prothrombin Time 25.6 (H) 9.1 - 12.0 sec  Comprehensive metabolic  panel with GFR     Status: None   Collection Time: 12/14/23 11:03 AM  Result Value Ref Range   Glucose CANCELED mg/dL    Comment: [facility] was unable to collect sufficient specimen to perform the following test(Jalie Eiland), and is providing the patient with re-collection instructions.  Result canceled by the ancillary.    BUN CANCELED mg/dL    Comment: [facility] was unable to collect sufficient specimen to perform the following test(Andreah Goheen), and is providing the patient with re-collection instructions.  Result canceled by the ancillary.    Creatinine, Ser CANCELED mg/dL    Comment: [facility] was unable to collect sufficient specimen to perform the following test(Iraida Cragin), and is providing the patient with re-collection instructions.  Result canceled by the ancillary.    BUN/Creatinine Ratio CANCELED     Comment: Unable to calculate result since non-numeric result obtained for component test.  Result canceled by the ancillary.    Sodium CANCELED mmol/L    Comment: [facility] was unable to collect sufficient specimen to perform the following test(Jerica Creegan), and is providing the patient with re-collection instructions.  Result canceled by the ancillary.    Potassium CANCELED mmol/L    Comment: [facility] was unable to collect sufficient specimen to perform the following test([place]), and is providing the patient with re-collection instructions.  Result canceled by the ancillary.    Chloride CANCELED mmol/L    Comment: [facility] was unable to collect sufficient specimen to perform the following test(Jonie Burdell), and is providing the patient with re-collection instructions.  Result  canceled by the ancillary.    CO2 CANCELED mmol/L    Comment: [facility] was unable to collect sufficient specimen to perform the following test(Aniston Christman), and is providing the patient with re-collection instructions.  Result canceled by the ancillary.    Calcium  CANCELED mg/dL    Comment: [facility] was unable to collect sufficient specimen to  perform the following test(Shaely Gadberry), and is providing the patient with re-collection instructions.  Result canceled by the ancillary.    Total Protein CANCELED g/dL    Comment: [facility] was unable to collect sufficient specimen to perform the following test(Antonique Langford), and is providing the patient with re-collection instructions.  Result canceled by the ancillary.    Albumin CANCELED g/dL    Comment: [facility] was unable to collect sufficient specimen to perform the following test(Troye Hiemstra), and is providing the patient with re-collection instructions.  Result canceled by the ancillary.    Globulin, Total CANCELED g/dL    Comment: Unable to calculate result since non-numeric result obtained for component test.  Result canceled by the ancillary.    Bilirubin Total CANCELED mg/dL    Comment: [facility] was unable to collect sufficient specimen to perform the following test(Sheala Dosh), and is providing the patient with re-collection instructions.  Result canceled by the ancillary.    Alkaline Phosphatase CANCELED IU/L    Comment: [facility] was unable to collect sufficient specimen to perform the following test(Jenan Ellegood), and is providing the patient with re-collection instructions.  Result canceled by the ancillary.    AST CANCELED IU/L    Comment: [facility] was unable to collect sufficient specimen to perform the following test(Karryn Kosinski), and is providing the patient with re-collection instructions.  Result canceled by the ancillary.    ALT CANCELED IU/L    Comment: [facility] was unable to collect sufficient specimen to perform the following test(Bronislaus Verdell), and is providing the patient with re-collection instructions.  Result canceled by the ancillary.   Lipid panel     Status: None   Collection Time: 12/14/23 11:03 AM  Result Value Ref Range   Cholesterol, Total CANCELED mg/dL    Comment: [facility] was unable to collect sufficient specimen to perform the following test(Ryleeann Urquiza), and is providing the patient with  re-collection instructions.  Result canceled by the ancillary.    Triglycerides CANCELED mg/dL    Comment: [facility] was unable to collect sufficient specimen to perform the following test(Madilyn Cephas), and is providing the patient with re-collection instructions.  Result canceled by the ancillary.    HDL CANCELED mg/dL    Comment: [facility] was unable to collect sufficient specimen to perform the following test(Selyna Klahn), and is providing the patient with re-collection instructions.  Result canceled by the ancillary.    VLDL Cholesterol Cal CANCELED mg/dL    Comment: Unable to calculate result since non-numeric result obtained for component test.  Result canceled by the ancillary.    LDL Chol Calc (NIH) CANCELED mg/dL    Comment: Unable to calculate result since non-numeric result obtained for component test.  Result canceled by the ancillary.    LDL CALC COMMENT: CANCELED     Comment: Unable to calculate result since non-numeric result obtained for component test.  Result canceled by the ancillary.    Chol/HDL Ratio CANCELED ratio    Comment: Unable to calculate result since non-numeric result obtained for component test.                                   T.  Chol/HDL Ratio                                             Men  Women                               1/2 Avg.Risk  3.4    3.3                                   Avg.Risk  5.0    4.4                                2X Avg.Risk  9.6    7.1                                3X Avg.Risk 23.4   11.0  Result canceled by the ancillary.   Hemoglobin A1c     Status: Abnormal   Collection Time: 12/14/23 11:03 AM  Result Value Ref Range   Hgb A1c MFr Bld 7.1 (H) 4.8 - 5.6 %    Comment:          Prediabetes: 5.7 - 6.4          Diabetes: >6.4          Glycemic control for adults with diabetes: <7.0    Est. average glucose Bld gHb Est-mCnc 157 mg/dL  Protime-INR     Status: Abnormal   Collection Time: 12/28/23  9:18 AM  Result Value Ref  Range   INR 2.1 (H) 0.9 - 1.2    Comment: Reference interval is for non-anticoagulated patients. Suggested INR therapeutic range for Vitamin K antagonist therapy:    Standard Dose (moderate intensity                   therapeutic range):       2.0 - 3.0    Higher intensity therapeutic range       2.5 - 3.5    Prothrombin Time 22.2 (H) 9.1 - 12.0 sec  Lipid Profile     Status: Abnormal   Collection Time: 01/21/24 11:09 AM  Result Value Ref Range   Cholesterol, Total 119 100 - 199 mg/dL   Triglycerides 161 0 - 149 mg/dL   HDL 31 (L) >09 mg/dL   VLDL Cholesterol Cal 21 5 - 40 mg/dL   LDL Chol Calc (NIH) 67 0 - 99 mg/dL   Chol/HDL Ratio 3.8 0.0 - 5.0 ratio    Comment:                                   T. Chol/HDL Ratio                                             Men  Women  1/2 Avg.Risk  3.4    3.3                                   Avg.Risk  5.0    4.4                                2X Avg.Risk  9.6    7.1                                3X Avg.Risk 23.4   11.0   Protime-INR     Status: Abnormal   Collection Time: 01/21/24 11:13 AM  Result Value Ref Range   INR 1.7 (H) 0.9 - 1.2    Comment: Reference interval is for non-anticoagulated patients. Suggested INR therapeutic range for Vitamin K antagonist therapy:    Standard Dose (moderate intensity                   therapeutic range):       2.0 - 3.0    Higher intensity therapeutic range       2.5 - 3.5    Prothrombin Time 18.2 (H) 9.1 - 12.0 sec      Assessment & Plan:  As per problem list. Patient instructed to comply with medications as prescribed  Problem List Items Addressed This Visit       Genitourinary   CKD stage 3a, GFR 45-59 ml/min (HCC)   Relevant Orders   Comprehensive metabolic panel with GFR     Other   Mixed hyperlipidemia - Primary   Relevant Orders   Lipid panel   Other Visit Diagnoses       Controlled type 2 diabetes mellitus without complication, without long-term  current use of insulin (HCC)       Relevant Medications   pioglitazone  (ACTOS ) 30 MG tablet   empagliflozin  (JARDIANCE ) 25 MG TABS tablet   Other Relevant Orders   Hemoglobin A1c   Hemoglobin A1c     Seasonal allergic rhinitis due to pollen       Relevant Medications   fluticasone  (FLONASE ) 50 MCG/ACT nasal spray     Uncontrolled type 2 diabetes mellitus with hyperglycemia (HCC)       Relevant Medications   pioglitazone  (ACTOS ) 30 MG tablet   empagliflozin  (JARDIANCE ) 25 MG TABS tablet     Annual wellness visit           Return in 3 months (on 04/29/2024) for fu with labs prior.   Total time spent: 30 minutes  Arzella Bitters, MD  01/28/2024   This document may have been prepared by [facility] Voice Recognition software and as such may include unintentional dictation errors.

## 2024-03-24 ENCOUNTER — Other Ambulatory Visit: Payer: Self-pay | Admitting: Cardiovascular Disease

## 2024-03-24 ENCOUNTER — Other Ambulatory Visit

## 2024-03-24 DIAGNOSIS — E119 Type 2 diabetes mellitus without complications: Secondary | ICD-10-CM

## 2024-03-24 DIAGNOSIS — D689 Coagulation defect, unspecified: Secondary | ICD-10-CM | POA: Diagnosis not present

## 2024-03-24 DIAGNOSIS — E782 Mixed hyperlipidemia: Secondary | ICD-10-CM | POA: Diagnosis not present

## 2024-03-25 ENCOUNTER — Ambulatory Visit: Payer: Self-pay | Admitting: Cardiology

## 2024-03-25 LAB — LIPID PANEL
Chol/HDL Ratio: 3.8 ratio (ref 0.0–5.0)
Cholesterol, Total: 110 mg/dL (ref 100–199)
HDL: 29 mg/dL — ABNORMAL LOW (ref >39)
LDL Chol Calc (NIH): 62 mg/dL (ref 0–99)
Triglycerides: 101 mg/dL (ref 0–149)
VLDL Cholesterol Cal: 19 mg/dL (ref 5–40)

## 2024-03-25 LAB — PROTIME-INR
INR: 1.6 — ABNORMAL HIGH (ref 0.9–1.2)
Prothrombin Time: 16.6 s — ABNORMAL HIGH (ref 9.1–12.0)

## 2024-03-25 LAB — HEMOGLOBIN A1C
Est. average glucose Bld gHb Est-mCnc: 137 mg/dL
Hgb A1c MFr Bld: 6.4 % — ABNORMAL HIGH (ref 4.8–5.6)

## 2024-03-30 ENCOUNTER — Other Ambulatory Visit: Payer: Self-pay | Admitting: Internal Medicine

## 2024-03-30 DIAGNOSIS — E119 Type 2 diabetes mellitus without complications: Secondary | ICD-10-CM

## 2024-04-04 ENCOUNTER — Encounter: Payer: Self-pay | Admitting: Cardiovascular Disease

## 2024-04-04 ENCOUNTER — Other Ambulatory Visit: Payer: Self-pay

## 2024-04-04 ENCOUNTER — Ambulatory Visit: Admitting: Cardiovascular Disease

## 2024-04-04 VITALS — BP 130/62 | HR 68 | Ht 66.0 in | Wt 164.0 lb

## 2024-04-04 DIAGNOSIS — E119 Type 2 diabetes mellitus without complications: Secondary | ICD-10-CM

## 2024-04-04 DIAGNOSIS — I4811 Longstanding persistent atrial fibrillation: Secondary | ICD-10-CM | POA: Diagnosis not present

## 2024-04-04 DIAGNOSIS — I2510 Atherosclerotic heart disease of native coronary artery without angina pectoris: Secondary | ICD-10-CM

## 2024-04-04 DIAGNOSIS — N401 Enlarged prostate with lower urinary tract symptoms: Secondary | ICD-10-CM

## 2024-04-04 DIAGNOSIS — G4733 Obstructive sleep apnea (adult) (pediatric): Secondary | ICD-10-CM | POA: Diagnosis not present

## 2024-04-04 DIAGNOSIS — I952 Hypotension due to drugs: Secondary | ICD-10-CM | POA: Diagnosis not present

## 2024-04-04 DIAGNOSIS — E782 Mixed hyperlipidemia: Secondary | ICD-10-CM

## 2024-04-04 DIAGNOSIS — I480 Paroxysmal atrial fibrillation: Secondary | ICD-10-CM

## 2024-04-04 DIAGNOSIS — I4891 Unspecified atrial fibrillation: Secondary | ICD-10-CM

## 2024-04-04 DIAGNOSIS — N1831 Chronic kidney disease, stage 3a: Secondary | ICD-10-CM

## 2024-04-04 DIAGNOSIS — I10 Essential (primary) hypertension: Secondary | ICD-10-CM

## 2024-04-04 MED ORDER — PIOGLITAZONE HCL 30 MG PO TABS: 30.0000 mg | ORAL_TABLET | Freq: Every day | ORAL | 1 refills | Status: DC

## 2024-04-04 MED ORDER — ENALAPRIL MALEATE 2.5 MG PO TABS
2.5000 mg | ORAL_TABLET | Freq: Every day | ORAL | 3 refills | Status: AC
Start: 2024-04-04 — End: 2025-04-04

## 2024-04-04 MED ORDER — WARFARIN SODIUM 5 MG PO TABS: ORAL_TABLET | ORAL | 0 refills | Status: DC

## 2024-04-04 MED ORDER — DAPAGLIFLOZIN PROPANEDIOL 10 MG PO TABS: 10.0000 mg | ORAL_TABLET | Freq: Every day | ORAL | 3 refills | Status: DC

## 2024-04-04 MED ORDER — TAMSULOSIN HCL 0.4 MG PO CAPS: 0.4000 mg | ORAL_CAPSULE | Freq: Every day | ORAL | 1 refills | Status: AC

## 2024-04-04 MED ORDER — ATORVASTATIN CALCIUM 80 MG PO TABS: 80.0000 mg | ORAL_TABLET | Freq: Every day | ORAL | 3 refills | Status: AC

## 2024-04-04 NOTE — Progress Notes (Signed)
 Cardiology Office Note   Date:  04/04/2024   ID:  Kyle Santos, DOB 01-23-1936, MRN [id number]  PCP:  Albina GORMAN Dine, MD  Cardiologist:  Denyse Bathe, MD      History of Present Illness: Kyle Santos is a 88 y.o. male who presents for  Chief Complaint  Patient presents with   Follow-up    3 months follow up    Has lack of energy.      Past Medical History:  Diagnosis Date   A-fib [facility])    Arthritis    BPH (benign prostatic hyperplasia)    Depression    Heart murmur    HOH (hard of hearing)    Hypercholesteremia    Hypertension    Loss of equilibrium    being evaluated at this time.   Pneumonia    history of   Sleep apnea      Past Surgical History:  Procedure Laterality Date   CATARACT EXTRACTION W/PHACO Right 01/09/2017   Procedure: CATARACT EXTRACTION PHACO AND INTRAOCULAR LENS PLACEMENT (IOC);  Surgeon: Jaye Fallow, MD;  Location: [facility] ORS;  Service: Ophthalmology;  Laterality: Right;  US  01:06 AP% 19.7 CDE 13.18 Fluid pack lot # [id number]   CATARACT EXTRACTION W/PHACO Left 08/07/2017   Procedure: CATARACT EXTRACTION PHACO AND INTRAOCULAR LENS PLACEMENT (IOC)-LEFT;  Surgeon: Jaye Fallow, MD;  Location: [facility] ORS;  Service: Ophthalmology;  Laterality: Left;  US   00:43 AP% 15.0 CDE 6.50 Fluid pack lot # [id number]   COLONOSCOPY     JOINT REPLACEMENT     knee   JOINT REPLACEMENT     shoulder     Current Outpatient Medications  Medication Sig Dispense Refill   Accu-Chek Softclix Lancets lancets Use as instructed 100 each 12   B Complex Vitamins (B COMPLEX-B12 PO) Take 1 tablet by mouth daily.     Cascara Sagrada 450 MG CAPS Take 450 mg by mouth 2 (two) times daily.     Cholecalciferol (VITAMIN D3) 1000 units CAPS Take 1,000 Units by mouth daily at 3 pm.     dapagliflozin  propanediol (FARXIGA ) 10 MG TABS tablet Take 1 tablet (10 mg total) by mouth daily. 30 tablet 3   fluticasone  (FLONASE ) 50 MCG/ACT nasal spray Place 1 spray into  both nostrils daily. 48 mL 1   glucose blood (ACCU-CHEK GUIDE TEST) test strip Use to check blood sugar TID (3 times daily) 100 each 12   meclizine  (ANTIVERT ) 25 MG tablet Take 1 tablet (25 mg total) by mouth 3 (three) times daily as needed for dizziness. 30 tablet 1   Melatonin 5 MG TABS Take 5 mg by mouth at bedtime.      pioglitazone  (ACTOS ) 30 MG tablet TAKE 1 TABLET BY MOUTH DAILY 90 tablet 1   tamsulosin  (FLOMAX ) 0.4 MG CAPS capsule TAKE 1 CAPSULE EVERY DAY 90 capsule 1   traZODone  (DESYREL ) 100 MG tablet TAKE 1 TABLET AT BEDTIME 90 tablet 3   atorvastatin  (LIPITOR) 80 MG tablet Take 1 tablet (80 mg total) by mouth daily. 90 tablet 3   enalapril  (VASOTEC ) 2.5 MG tablet Take 1 tablet (2.5 mg total) by mouth daily. 90 tablet 3   warfarin (COUMADIN ) 5 MG tablet TAKE 5MG  ALTERNATING WITH 7.5MG  DAILY 35 tablet 0   No current facility-administered medications for this visit.    Allergies:   Patient has no known allergies.    Social History:   reports that he quit smoking about 60 years ago. His smoking use  included cigarettes. He started smoking about 67 years ago. He has a 3.5 pack-year smoking history. He has never used smokeless tobacco. He reports current alcohol use. He reports that he does not use drugs.   Family History:  family history includes Heart disease in his father and mother.    ROS:     Review of Systems  Constitutional: Negative.   HENT: Negative.    Eyes: Negative.   Respiratory: Negative.    Gastrointestinal: Negative.   Genitourinary: Negative.   Musculoskeletal: Negative.   Skin: Negative.   Neurological: Negative.   Endo/Heme/Allergies: Negative.   Psychiatric/Behavioral: Negative.    All other systems reviewed and are negative.     All other systems are reviewed and negative.    PHYSICAL EXAM: VS:  BP 130/62   Pulse 68   Ht 5' 6 (1.676 m)   Wt 164 lb (74.4 kg)   SpO2 98%   BMI 26.47 kg/m  , BMI Body mass index is 26.47 kg/m. Last weight:   Wt Readings from Last 3 Encounters:  04/04/24 164 lb (74.4 kg)  01/28/24 165 lb (74.8 kg)  01/04/24 164 lb 12.8 oz (74.8 kg)     Physical Exam Vitals reviewed.  Constitutional:      Appearance: Normal appearance. He is normal weight.  HENT:     Head: Normocephalic.     Nose: Nose normal.     Mouth/Throat:     Mouth: Mucous membranes are moist.  Eyes:     Pupils: Pupils are equal, round, and reactive to light.  Cardiovascular:     Rate and Rhythm: Normal rate and regular rhythm.     Pulses: Normal pulses.     Heart sounds: Normal heart sounds.  Pulmonary:     Effort: Pulmonary effort is normal.  Abdominal:     General: Abdomen is flat. Bowel sounds are normal.  Musculoskeletal:        General: Normal range of motion.     Cervical back: Normal range of motion.  Skin:    General: Skin is warm.  Neurological:     General: No focal deficit present.     Mental Status: He is alert.  Psychiatric:        Mood and Affect: Mood normal.       EKG:   Recent Labs: 07/09/2023: Hemoglobin 13.5; Platelets 149 12/14/2023: ALT CANCELED; BUN CANCELED; Creatinine, Ser CANCELED; Potassium CANCELED; Sodium CANCELED    Lipid Panel    Component Value Date/Time   CHOL 110 03/24/2024 1059   TRIG 101 03/24/2024 1059   HDL 29 (L) 03/24/2024 1059   CHOLHDL 3.8 03/24/2024 1059   LDLCALC 62 03/24/2024 1059      Other studies Reviewed: Additional studies/ records that were reviewed today include:  Review of the above records demonstrates:       No data to display            ASSESSMENT AND PLAN:    ICD-10-CM   1. Paroxysmal atrial fibrillation (HCC)  I48.0 enalapril  (VASOTEC ) 2.5 MG tablet    Protime-INR    2. Coronary artery disease involving native coronary artery of native heart without angina pectoris  I25.10 enalapril  (VASOTEC ) 2.5 MG tablet    Protime-INR    3. Essential hypertension  I10 Protime-INR    4. Mixed hyperlipidemia  E78.2 Protime-INR    5. CKD  stage 3a, GFR 45-59 ml/min (HCC)  N18.31 Protime-INR   sart farxiga , as feels tired as creat 1.5  6. Hypotension due to drugs  I95.2 enalapril  (VASOTEC ) 2.5 MG tablet    atorvastatin  (LIPITOR) 80 MG tablet    Protime-INR   stop losartan  and start vasotec  2.5 daily    7. Longstanding persistent atrial fibrillation (HCC)  I48.11 atorvastatin  (LIPITOR) 80 MG tablet    Protime-INR    8. Hypotension due to drugs  I95.2 enalapril  (VASOTEC ) 2.5 MG tablet    atorvastatin  (LIPITOR) 80 MG tablet    Protime-INR    9. OSA on CPAP  G47.33 atorvastatin  (LIPITOR) 80 MG tablet    Protime-INR    10. Atrial fibrillation, unspecified type (HCC)  I48.91 warfarin (COUMADIN ) 5 MG tablet    Protime-INR       Problem List Items Addressed This Visit       Cardiovascular and Mediastinum   Hypotension due to drugs   Relevant Medications   enalapril  (VASOTEC ) 2.5 MG tablet   atorvastatin  (LIPITOR) 80 MG tablet   warfarin (COUMADIN ) 5 MG tablet   Other Relevant Orders   Protime-INR   Atrial fibrillation (HCC) - Primary   Relevant Medications   enalapril  (VASOTEC ) 2.5 MG tablet   atorvastatin  (LIPITOR) 80 MG tablet   warfarin (COUMADIN ) 5 MG tablet   Other Relevant Orders   Protime-INR   Protime-INR   Protime-INR   Essential hypertension   Relevant Medications   enalapril  (VASOTEC ) 2.5 MG tablet   atorvastatin  (LIPITOR) 80 MG tablet   warfarin (COUMADIN ) 5 MG tablet   Other Relevant Orders   Protime-INR   Coronary artery disease involving native coronary artery of native heart   Relevant Medications   enalapril  (VASOTEC ) 2.5 MG tablet   atorvastatin  (LIPITOR) 80 MG tablet   warfarin (COUMADIN ) 5 MG tablet   Other Relevant Orders   Protime-INR     Respiratory   OSA on CPAP   Relevant Medications   atorvastatin  (LIPITOR) 80 MG tablet   Other Relevant Orders   Protime-INR     Genitourinary   CKD stage 3a, GFR 45-59 ml/min (HCC)   Relevant Orders   Protime-INR     Other   Mixed  hyperlipidemia   Relevant Medications   enalapril  (VASOTEC ) 2.5 MG tablet   atorvastatin  (LIPITOR) 80 MG tablet   warfarin (COUMADIN ) 5 MG tablet   Other Relevant Orders   Protime-INR       Disposition:   Return in about 4 weeks (around 05/02/2024).    Total time spent: 30 minutes  Signed,  Denyse Bathe, MD  04/04/2024 10:21 AM    [facility]

## 2024-04-05 LAB — APTT: aPTT: 29 s (ref 24–33)

## 2024-04-05 LAB — PROTIME-INR
INR: 1.5 — ABNORMAL HIGH (ref 0.9–1.2)
Prothrombin Time: 15.9 s — ABNORMAL HIGH (ref 9.1–12.0)

## 2024-04-07 ENCOUNTER — Other Ambulatory Visit: Payer: Self-pay

## 2024-04-07 ENCOUNTER — Ambulatory Visit: Payer: Self-pay

## 2024-04-07 MED ORDER — WARFARIN SODIUM 6 MG PO TABS: 6.0000 mg | ORAL_TABLET | Freq: Every day | ORAL | 0 refills | Status: DC

## 2024-04-23 ENCOUNTER — Other Ambulatory Visit: Payer: Self-pay | Admitting: Cardiovascular Disease

## 2024-04-23 DIAGNOSIS — I4891 Unspecified atrial fibrillation: Secondary | ICD-10-CM

## 2024-05-02 ENCOUNTER — Ambulatory Visit (INDEPENDENT_AMBULATORY_CARE_PROVIDER_SITE_OTHER): Admitting: Internal Medicine

## 2024-05-02 ENCOUNTER — Other Ambulatory Visit: Payer: Self-pay | Admitting: Cardiovascular Disease

## 2024-05-02 ENCOUNTER — Ambulatory Visit: Payer: Self-pay | Admitting: Internal Medicine

## 2024-05-02 VITALS — BP 120/62 | HR 59 | Temp 98.4°F | Ht 66.0 in | Wt 164.6 lb

## 2024-05-02 DIAGNOSIS — I951 Orthostatic hypotension: Secondary | ICD-10-CM | POA: Diagnosis not present

## 2024-05-02 DIAGNOSIS — E119 Type 2 diabetes mellitus without complications: Secondary | ICD-10-CM | POA: Diagnosis not present

## 2024-05-02 DIAGNOSIS — D689 Coagulation defect, unspecified: Secondary | ICD-10-CM | POA: Diagnosis not present

## 2024-05-02 DIAGNOSIS — R42 Dizziness and giddiness: Secondary | ICD-10-CM

## 2024-05-02 DIAGNOSIS — H68101 Unspecified obstruction of Eustachian tube, right ear: Secondary | ICD-10-CM | POA: Diagnosis not present

## 2024-05-02 DIAGNOSIS — F5101 Primary insomnia: Secondary | ICD-10-CM | POA: Diagnosis not present

## 2024-05-02 MED ORDER — TRAZODONE HCL 100 MG PO TABS: 100.0000 mg | ORAL_TABLET | Freq: Every day | ORAL | 3 refills | Status: DC

## 2024-05-02 MED ORDER — TRAZODONE HCL 100 MG PO TABS: 100.0000 mg | ORAL_TABLET | Freq: Every day | ORAL | 1 refills | Status: AC

## 2024-05-02 MED ORDER — MECLIZINE HCL 25 MG PO TABS: 25.0000 mg | ORAL_TABLET | Freq: Three times a day (TID) | ORAL | 2 refills | Status: DC | PRN

## 2024-05-02 NOTE — Progress Notes (Signed)
 Established Patient Office Visit  Subjective:  Patient ID: Kyle Santos, male    DOB: 30-Nov-1935  Age: 88 y.o. MRN: [id number]  Chief Complaint  Patient presents with  . Follow-up    3 month lab results    C/o feeling wobbly after rising from bed yesterday followed by a 3 hr episode of blurred vision which resolved spontaneously. Denies nausea or vomiting but admits to poor fluid intake. Also here for lab review and medication refills. Labs reviewed and notable for well controlled diabetes, A1c at target and  lipids at target. Denies any hypoglycemic episodes and home bg readings have been at target.     No other concerns at this time.   Past Medical History:  Diagnosis Date  . A-fib (HCC)   . Arthritis   . BPH (benign prostatic hyperplasia)   . Depression   . Heart murmur   . HOH (hard of hearing)   . Hypercholesteremia   . Hypertension   . Loss of equilibrium    being evaluated at this time.  . Pneumonia    history of  . Sleep apnea     Past Surgical History:  Procedure Laterality Date  . CATARACT EXTRACTION W/PHACO Right 01/09/2017   Procedure: CATARACT EXTRACTION PHACO AND INTRAOCULAR LENS PLACEMENT (IOC);  Surgeon: Jaye Fallow, MD;  Location: [facility] ORS;  Service: Ophthalmology;  Laterality: Right;  US  01:06 AP% 19.7 CDE 13.18 Fluid pack lot # [id number]  . CATARACT EXTRACTION W/PHACO Left 08/07/2017   Procedure: CATARACT EXTRACTION PHACO AND INTRAOCULAR LENS PLACEMENT (IOC)-LEFT;  Surgeon: Jaye Fallow, MD;  Location: [facility] ORS;  Service: Ophthalmology;  Laterality: Left;  US   00:43 AP% 15.0 CDE 6.50 Fluid pack lot # [id number]  . COLONOSCOPY    . JOINT REPLACEMENT     knee  . JOINT REPLACEMENT     shoulder    Social History   Socioeconomic History  . Marital status: Married    Spouse name: Rock  . Number of children: 3  . Years of education: College Degree  . Highest education level: Bachelor'Dyana Magner degree (e.g., BA, AB, BS)  Occupational  History  . Occupation: Retired  Tobacco Use  . Smoking status: Former    Current packs/day: 0.00    Average packs/day: 0.5 packs/day for 7.0 years (3.5 ttl pk-yrs)    Types: Cigarettes    Start date: 08/21/1956    Quit date: 08/22/1963    Years since quitting: 60.7  . Smokeless tobacco: Never  Vaping Use  . Vaping status: Never Used  Substance and Sexual Activity  . Alcohol use: Yes    Alcohol/week: 0.0 standard drinks of alcohol    Comment: occasional  . Drug use: No  . Sexual activity: Not Currently    Birth control/protection: None  Other Topics Concern  . Not on file  Social History Narrative   Drinks about 2 cups of caffeine.   Social Drivers of Corporate investment banker Strain: Low Risk  (06/01/2021)   Received from [facility]   Overall Financial Resource Strain (CARDIA)   . Difficulty of Paying Living Expenses: Not hard at all  Recent Concern: Financial Resource Strain - Medium Risk (05/13/2021)   Overall Financial Resource Strain (CARDIA)   . Difficulty of Paying Living Expenses: Somewhat hard  Food Insecurity: No Food Insecurity (06/01/2021)   [facility]   Hunger Vital Sign   . Within the past 12 months, you worried that your food would run  out before you got the money to buy more.: Never true   . Within the past 12 months, the food you bought just didn't last and you didn't have money to get more.: Never true  Transportation Needs: No Transportation Needs (06/01/2021)   Received from [facility] - Transportation   . Lack of Transportation (Medical): No   . Lack of Transportation (Non-Medical): No  Physical Activity: Sufficiently Active (05/13/2021)   Exercise Vital Sign   . Days of Exercise per Week: 3 days   . Minutes of Exercise per Session: 150+ min  Stress: Not on file  Social Connections: Socially Integrated (05/13/2021)   Social Connection and Isolation Panel   . Frequency of Communication with Friends and Family:  More than three times a week   . Frequency of Social Gatherings with Friends and Family: Once a week   . Attends Religious Services: More than 4 times per year   . Active Member of Clubs or Organizations: Yes   . Attends Banker Meetings: More than 4 times per year   . Marital Status: Married  Catering manager Violence: Not At Risk (05/13/2021)   Humiliation, Afraid, Rape, and Kick questionnaire   . Fear of Current or Ex-Partner: No   . Emotionally Abused: No   . Physically Abused: No   . Sexually Abused: No    Family History  Problem Relation Age of Onset  . Heart disease Mother   . Heart disease Father     No Known Allergies  Outpatient Medications Prior to Visit  Medication Sig  . Accu-Chek Softclix Lancets lancets Use as instructed  . atorvastatin  (LIPITOR) 80 MG tablet Take 1 tablet (80 mg total) by mouth daily.  . B Complex Vitamins (B COMPLEX-B12 PO) Take 1 tablet by mouth daily.  . Cascara Sagrada 450 MG CAPS Take 450 mg by mouth 2 (two) times daily.  . Cholecalciferol (VITAMIN D3) 1000 units CAPS Take 1,000 Units by mouth daily at 3 pm.  . enalapril  (VASOTEC ) 2.5 MG tablet Take 1 tablet (2.5 mg total) by mouth daily.  SABRA glucose blood (ACCU-CHEK GUIDE TEST) test strip Use to check blood sugar TID (3 times daily)  . meclizine  (ANTIVERT ) 25 MG tablet Take 1 tablet (25 mg total) by mouth 3 (three) times daily as needed for dizziness.  . Melatonin 5 MG TABS Take 5 mg by mouth at bedtime.   . pioglitazone  (ACTOS ) 30 MG tablet Take 1 tablet (30 mg total) by mouth daily.  . tamsulosin  (FLOMAX ) 0.4 MG CAPS capsule Take 1 capsule (0.4 mg total) by mouth daily.  SABRA warfarin (COUMADIN ) 5 MG tablet TAKE 5 MG ( 1 TABLET ) BY MOUTH  ALTERNATING WITH 7.5 MG ( 1.5  TABLETS ) EVERY OTHER DAY  . [DISCONTINUED] traZODone  (DESYREL ) 100 MG tablet TAKE 1 TABLET AT BEDTIME  . dapagliflozin  propanediol (FARXIGA ) 10 MG TABS tablet Take 1 tablet (10 mg total) by mouth daily. (Patient  not taking: Reported on 05/02/2024)  . fluticasone  (FLONASE ) 50 MCG/ACT nasal spray Place 1 spray into both nostrils daily. (Patient not taking: Reported on 05/02/2024)   No facility-administered medications prior to visit.    Review of Systems  Constitutional: Negative.   HENT: Negative.    Eyes: Negative.   Respiratory: Negative.    Gastrointestinal: Negative.   Genitourinary: Negative.   Musculoskeletal: Negative.   Skin: Negative.   Neurological:  Positive for dizziness.  Endo/Heme/Allergies: Negative.   Psychiatric/Behavioral: Negative.  All other systems reviewed and are negative.      Objective:   BP 120/62 (BP Location: Right Arm, Patient Position: Standing, Cuff Size: Normal)   Pulse (!) 59   Temp 98.4 F (36.9 C)   Ht 5' 6 (1.676 m)   Wt 164 lb 9.6 oz (74.7 kg)   SpO2 98%   BMI 26.57 kg/m   Vitals:   05/02/24 1003 05/02/24 1058 05/02/24 1100  BP:  (!) 140/68 120/62  Pulse:  66 (!) 59  Temp: 98.4 F (36.9 C)    Height: 5' 6 (1.676 m)    Weight: 164 lb 9.6 oz (74.7 kg)    SpO2: 98% 94% 98%  BMI (Calculated): 26.58      Physical Exam Vitals reviewed.  Constitutional:      Appearance: Normal appearance. He is normal weight.  HENT:     Head: Normocephalic.     Nose: Nose normal.     Mouth/Throat:     Mouth: Mucous membranes are moist.  Eyes:     Pupils: Pupils are equal, round, and reactive to light.  Cardiovascular:     Rate and Rhythm: Normal rate and regular rhythm.     Pulses: Normal pulses.     Heart sounds: Normal heart sounds.  Pulmonary:     Effort: Pulmonary effort is normal.  Abdominal:     General: Abdomen is flat. Bowel sounds are normal.  Musculoskeletal:        General: Normal range of motion.     Cervical back: Normal range of motion.  Skin:    General: Skin is warm.  Neurological:     General: No focal deficit present.     Mental Status: He is alert.  Psychiatric:        Mood and Affect: Mood normal.      No  results found for any visits on 05/02/24.  Lab Results  Component Value Date   HGBA1C 6.4 (H) 03/24/2024   HGBA1C 7.1 (H) 12/14/2023   HGBA1C 6.8 (H) 10/25/2023   Lipid Panel     Component Value Date/Time   CHOL 110 03/24/2024 1059   TRIG 101 03/24/2024 1059   HDL 29 (L) 03/24/2024 1059   CHOLHDL 3.8 03/24/2024 1059   LDLCALC 62 03/24/2024 1059   LABVLDL 19 03/24/2024 1059       Assessment & Plan:  Cesare was seen today for follow-up.  Dizziness -     BMP8+Anion Gap -     CBC With Diff/Platelet  Primary insomnia -     traZODone  HCl; Take 1 tablet (100 mg total) by mouth at bedtime.  Dispense: 90 tablet; Refill: 3  Controlled type 2 diabetes mellitus without complication, without long-term current use of insulin (HCC)  Orthostatic hypotension    Problem List Items Addressed This Visit       Cardiovascular and Mediastinum   Orthostatic hypotension     Endocrine   Controlled type 2 diabetes mellitus without complication, without long-term current use of insulin (HCC)     Other   Dizziness - Primary   Relevant Orders   BMP8+Anion Gap   CBC With Diff/Platelet   Other Visit Diagnoses       Primary insomnia       Relevant Medications   traZODone  (DESYREL ) 100 MG tablet       Return in about 2 weeks (around 05/16/2024).   Total time spent: 30 minutes  Sherrill Cinderella Perry, MD  05/02/2024   This document  may have been prepared by [employer] Voice Recognition software and as such may include unintentional dictation errors.

## 2024-05-03 LAB — CBC WITH DIFF/PLATELET
Basophils Absolute: 0 x10E3/uL (ref 0.0–0.2)
Basos: 1 %
EOS (ABSOLUTE): 0.1 x10E3/uL (ref 0.0–0.4)
Eos: 2 %
Hematocrit: 34.4 % — ABNORMAL LOW (ref 37.5–51.0)
Hemoglobin: 11.4 g/dL — ABNORMAL LOW (ref 13.0–17.7)
Immature Grans (Abs): 0 x10E3/uL (ref 0.0–0.1)
Immature Granulocytes: 0 %
Lymphocytes Absolute: 1.9 x10E3/uL (ref 0.7–3.1)
Lymphs: 30 %
MCH: 32.9 pg (ref 26.6–33.0)
MCHC: 33.1 g/dL (ref 31.5–35.7)
MCV: 99 fL — ABNORMAL HIGH (ref 79–97)
Monocytes Absolute: 0.5 x10E3/uL (ref 0.1–0.9)
Monocytes: 8 %
Neutrophils Absolute: 3.6 x10E3/uL (ref 1.4–7.0)
Neutrophils: 59 %
Platelets: 137 x10E3/uL — ABNORMAL LOW (ref 150–450)
RBC: 3.47 x10E6/uL — ABNORMAL LOW (ref 4.14–5.80)
RDW: 12.5 % (ref 11.6–15.4)
WBC: 6.2 x10E3/uL (ref 3.4–10.8)

## 2024-05-03 LAB — BMP8+ANION GAP
Anion Gap: 13 mmol/L (ref 10.0–18.0)
BUN/Creatinine Ratio: 17 (ref 10–24)
BUN: 22 mg/dL (ref 8–27)
CO2: 20 mmol/L (ref 20–29)
Calcium: 9 mg/dL (ref 8.6–10.2)
Chloride: 108 mmol/L — ABNORMAL HIGH (ref 96–106)
Creatinine, Ser: 1.33 mg/dL — ABNORMAL HIGH (ref 0.76–1.27)
Glucose: 98 mg/dL (ref 70–99)
Potassium: 5 mmol/L (ref 3.5–5.2)
Sodium: 141 mmol/L (ref 134–144)
eGFR: 52 mL/min/1.73 — ABNORMAL LOW (ref >59)

## 2024-05-03 LAB — PROTIME-INR
INR: 2.9 — ABNORMAL HIGH (ref 0.9–1.2)
Prothrombin Time: 29.3 s — ABNORMAL HIGH (ref 9.1–12.0)

## 2024-05-05 ENCOUNTER — Ambulatory Visit: Payer: Self-pay

## 2024-05-06 ENCOUNTER — Ambulatory Visit: Admitting: Cardiovascular Disease

## 2024-05-08 ENCOUNTER — Encounter: Payer: Self-pay | Admitting: Cardiovascular Disease

## 2024-05-08 ENCOUNTER — Ambulatory Visit (INDEPENDENT_AMBULATORY_CARE_PROVIDER_SITE_OTHER): Admitting: Cardiovascular Disease

## 2024-05-08 VITALS — BP 119/74 | HR 67 | Ht 66.0 in | Wt 168.0 lb

## 2024-05-08 DIAGNOSIS — R42 Dizziness and giddiness: Secondary | ICD-10-CM | POA: Diagnosis not present

## 2024-05-08 DIAGNOSIS — I480 Paroxysmal atrial fibrillation: Secondary | ICD-10-CM | POA: Diagnosis not present

## 2024-05-08 DIAGNOSIS — I4891 Unspecified atrial fibrillation: Secondary | ICD-10-CM | POA: Diagnosis not present

## 2024-05-08 DIAGNOSIS — I951 Orthostatic hypotension: Secondary | ICD-10-CM | POA: Diagnosis not present

## 2024-05-08 DIAGNOSIS — I2510 Atherosclerotic heart disease of native coronary artery without angina pectoris: Secondary | ICD-10-CM | POA: Diagnosis not present

## 2024-05-08 DIAGNOSIS — N1831 Chronic kidney disease, stage 3a: Secondary | ICD-10-CM

## 2024-05-08 NOTE — Progress Notes (Signed)
 Cardiology Office Note   Date:  05/08/2024   ID:  Kyle Santos, DOB 1936-03-28, MRN [id number]  PCP:  Albina GORMAN Dine, MD  Cardiologist:  Denyse Bathe, MD      History of Present Illness: Kyle Santos is a 88 y.o. male who presents for  Chief Complaint  Patient presents with   Follow-up    4 week follow up     Last week, had dizziness, and was seen and got IVF in office.      Past Medical History:  Diagnosis Date   A-fib [facility])    Arthritis    BPH (benign prostatic hyperplasia)    Depression    Heart murmur    HOH (hard of hearing)    Hypercholesteremia    Hypertension    Loss of equilibrium    being evaluated at this time.   Pneumonia    history of   Sleep apnea      Past Surgical History:  Procedure Laterality Date   CATARACT EXTRACTION W/PHACO Right 01/09/2017   Procedure: CATARACT EXTRACTION PHACO AND INTRAOCULAR LENS PLACEMENT (IOC);  Surgeon: Jaye Fallow, MD;  Location: [facility] ORS;  Service: Ophthalmology;  Laterality: Right;  US  01:06 AP% 19.7 CDE 13.18 Fluid pack lot # [id number]   CATARACT EXTRACTION W/PHACO Left 08/07/2017   Procedure: CATARACT EXTRACTION PHACO AND INTRAOCULAR LENS PLACEMENT (IOC)-LEFT;  Surgeon: Jaye Fallow, MD;  Location: [facility] ORS;  Service: Ophthalmology;  Laterality: Left;  US   00:43 AP% 15.0 CDE 6.50 Fluid pack lot # [id number]   COLONOSCOPY     JOINT REPLACEMENT     knee   JOINT REPLACEMENT     shoulder     Current Outpatient Medications  Medication Sig Dispense Refill   Accu-Chek Softclix Lancets lancets Use as instructed 100 each 12   atorvastatin  (LIPITOR) 80 MG tablet Take 1 tablet (80 mg total) by mouth daily. 90 tablet 3   B Complex Vitamins (B COMPLEX-B12 PO) Take 1 tablet by mouth daily.     Cascara Sagrada 450 MG CAPS Take 450 mg by mouth 2 (two) times daily.     Cholecalciferol (VITAMIN D3) 1000 units CAPS Take 1,000 Units by mouth daily at 3 pm.     enalapril  (VASOTEC ) 2.5 MG tablet Take 1  tablet (2.5 mg total) by mouth daily. 90 tablet 3   glucose blood (ACCU-CHEK GUIDE TEST) test strip Use to check blood sugar TID (3 times daily) 100 each 12   meclizine  (ANTIVERT ) 25 MG tablet Take 1 tablet (25 mg total) by mouth 3 (three) times daily as needed for dizziness. 30 tablet 2   Melatonin 5 MG TABS Take 5 mg by mouth at bedtime.      pioglitazone  (ACTOS ) 30 MG tablet Take 1 tablet (30 mg total) by mouth daily. 90 tablet 1   tamsulosin  (FLOMAX ) 0.4 MG CAPS capsule Take 1 capsule (0.4 mg total) by mouth daily. 90 capsule 1   traZODone  (DESYREL ) 100 MG tablet Take 1 tablet (100 mg total) by mouth at bedtime. 90 tablet 1   warfarin (COUMADIN ) 5 MG tablet TAKE 5 MG ( 1 TABLET ) BY MOUTH  ALTERNATING WITH 7.5 MG ( 1.5  TABLETS ) EVERY OTHER DAY 35 tablet 11   No current facility-administered medications for this visit.    Allergies:   Patient has no known allergies.    Social History:   reports that he quit smoking about 60 years ago. His smoking use included cigarettes.  He started smoking about 67 years ago. He has a 3.5 pack-year smoking history. He has never used smokeless tobacco. He reports current alcohol use. He reports that he does not use drugs.   Family History:  family history includes Heart disease in his father and mother.    ROS:     Review of Systems  Constitutional: Negative.   HENT: Negative.    Eyes: Negative.   Respiratory: Negative.    Gastrointestinal: Negative.   Genitourinary: Negative.   Musculoskeletal: Negative.   Skin: Negative.   Neurological: Negative.   Endo/Heme/Allergies: Negative.   Psychiatric/Behavioral: Negative.    All other systems reviewed and are negative.     All other systems are reviewed and negative.    PHYSICAL EXAM: VS:  BP 119/74   Pulse 67   Ht 5' 6 (1.676 m)   Wt 168 lb (76.2 kg)   SpO2 94%   BMI 27.12 kg/m  , BMI Body mass index is 27.12 kg/m. Last weight:  Wt Readings from Last 3 Encounters:  05/08/24 168 lb  (76.2 kg)  05/02/24 164 lb 9.6 oz (74.7 kg)  04/04/24 164 lb (74.4 kg)     Physical Exam Vitals reviewed.  Constitutional:      Appearance: Normal appearance. He is normal weight.  HENT:     Head: Normocephalic.     Nose: Nose normal.     Mouth/Throat:     Mouth: Mucous membranes are moist.  Eyes:     Pupils: Pupils are equal, round, and reactive to light.  Cardiovascular:     Rate and Rhythm: Normal rate and regular rhythm.     Pulses: Normal pulses.     Heart sounds: Normal heart sounds.  Pulmonary:     Effort: Pulmonary effort is normal.  Abdominal:     General: Abdomen is flat. Bowel sounds are normal.  Musculoskeletal:        General: Normal range of motion.     Cervical back: Normal range of motion.  Skin:    General: Skin is warm.  Neurological:     General: No focal deficit present.     Mental Status: He is alert.  Psychiatric:        Mood and Affect: Mood normal.       EKG:   Recent Labs: 12/14/2023: ALT CANCELED 05/02/2024: BUN 22; Creatinine, Ser 1.33; Hemoglobin 11.4; Platelets 137; Potassium 5.0; Sodium 141    Lipid Panel    Component Value Date/Time   CHOL 110 03/24/2024 1059   TRIG 101 03/24/2024 1059   HDL 29 (L) 03/24/2024 1059   CHOLHDL 3.8 03/24/2024 1059   LDLCALC 62 03/24/2024 1059      Other studies Reviewed: Additional studies/ records that were reviewed today include:  Review of the above records demonstrates:       No data to display            ASSESSMENT AND PLAN:    ICD-10-CM   1. Coronary artery disease involving native coronary artery of native heart without angina pectoris  I25.10     2. Paroxysmal atrial fibrillation (HCC)  I48.0     3. Atrial fibrillation, unspecified type (HCC)  I48.91     4. Orthostatic hypotension  I95.1     5. Dizziness  R42    Feels better after IVF    6. CKD stage 3a, GFR 45-59 ml/min (HCC)  N18.31    unable to afford farxiga . Did not feel much better with  samples.        Problem List Items Addressed This Visit       Cardiovascular and Mediastinum   Atrial fibrillation (HCC)   Coronary artery disease involving native coronary artery of native heart - Primary   Orthostatic hypotension     Genitourinary   CKD stage 3a, GFR 45-59 ml/min (HCC)     Other   Dizziness       Disposition:   Return in about 2 months (around 07/08/2024).    Total time spent: 30 minutes  Signed,  Denyse Bathe, MD  05/08/2024 10:20 AM    [facility]

## 2024-05-20 ENCOUNTER — Other Ambulatory Visit: Payer: Self-pay | Admitting: Cardiovascular Disease

## 2024-05-20 ENCOUNTER — Encounter: Payer: Self-pay | Admitting: Internal Medicine

## 2024-05-20 ENCOUNTER — Ambulatory Visit (INDEPENDENT_AMBULATORY_CARE_PROVIDER_SITE_OTHER): Admitting: Internal Medicine

## 2024-05-20 VITALS — BP 113/77 | HR 62 | Temp 97.7°F | Ht 66.0 in | Wt 166.0 lb

## 2024-05-20 DIAGNOSIS — R42 Dizziness and giddiness: Secondary | ICD-10-CM

## 2024-05-20 DIAGNOSIS — I951 Orthostatic hypotension: Secondary | ICD-10-CM | POA: Diagnosis not present

## 2024-05-20 DIAGNOSIS — N1831 Chronic kidney disease, stage 3a: Secondary | ICD-10-CM | POA: Diagnosis not present

## 2024-05-20 DIAGNOSIS — E782 Mixed hyperlipidemia: Secondary | ICD-10-CM

## 2024-05-20 DIAGNOSIS — E1165 Type 2 diabetes mellitus with hyperglycemia: Secondary | ICD-10-CM | POA: Diagnosis not present

## 2024-05-20 DIAGNOSIS — D689 Coagulation defect, unspecified: Secondary | ICD-10-CM | POA: Diagnosis not present

## 2024-05-20 DIAGNOSIS — E119 Type 2 diabetes mellitus without complications: Secondary | ICD-10-CM

## 2024-05-20 LAB — POCT CBG (FASTING - GLUCOSE)-MANUAL ENTRY: Glucose Fasting, POC: 164 mg/dL — AB (ref 70–99)

## 2024-05-20 NOTE — Progress Notes (Signed)
 Established Patient Office Visit  Subjective:  Patient ID: Kyle Santos, male    DOB: Mar 26, 1936  Age: 88 y.o. MRN: [id number]  Chief Complaint  Patient presents with   Follow-up    2 week follow up    No further dizziness and no new complaints. Increased his fluid intake to at least 100 oz/day.    No other concerns at this time.   Past Medical History:  Diagnosis Date   A-fib [facility])    Arthritis    BPH (benign prostatic hyperplasia)    Depression    Heart murmur    HOH (hard of hearing)    Hypercholesteremia    Hypertension    Loss of equilibrium    being evaluated at this time.   Pneumonia    history of   Sleep apnea     Past Surgical History:  Procedure Laterality Date   CATARACT EXTRACTION W/PHACO Right 01/09/2017   Procedure: CATARACT EXTRACTION PHACO AND INTRAOCULAR LENS PLACEMENT (IOC);  Surgeon: Jaye Fallow, MD;  Location: [facility] ORS;  Service: Ophthalmology;  Laterality: Right;  US  01:06 AP% 19.7 CDE 13.18 Fluid pack lot # [id number]   CATARACT EXTRACTION W/PHACO Left 08/07/2017   Procedure: CATARACT EXTRACTION PHACO AND INTRAOCULAR LENS PLACEMENT (IOC)-LEFT;  Surgeon: Jaye Fallow, MD;  Location: [facility] ORS;  Service: Ophthalmology;  Laterality: Left;  US   00:43 AP% 15.0 CDE 6.50 Fluid pack lot # [id number]   COLONOSCOPY     JOINT REPLACEMENT     knee   JOINT REPLACEMENT     shoulder    Social History   Socioeconomic History   Marital status: Married    Spouse name: Rock   Number of children: 3   Years of education: College Degree   Highest education level: Bachelor'Jalissa Heinzelman degree (e.g., BA, AB, BS)  Occupational History   Occupation: Retired  Tobacco Use   Smoking status: Former    Current packs/day: 0.00    Average packs/day: 0.5 packs/day for 7.0 years (3.5 ttl pk-yrs)    Types: Cigarettes    Start date: 08/21/1956    Quit date: 08/22/1963    Years since quitting: 60.7   Smokeless tobacco: Never  Vaping Use   Vaping status: Never Used   Substance and Sexual Activity   Alcohol use: Yes    Alcohol/week: 0.0 standard drinks of alcohol    Comment: occasional   Drug use: No   Sexual activity: Not Currently    Birth control/protection: None  Other Topics Concern   Not on file  Social History Narrative   Drinks about 2 cups of caffeine.   Social Drivers of Corporate investment banker Strain: Low Risk  (06/01/2021)   Received from [facility]   Overall Financial Resource Strain (CARDIA)    Difficulty of Paying Living Expenses: Not hard at all  Recent Concern: Financial Resource Strain - Medium Risk (05/13/2021)   Overall Financial Resource Strain (CARDIA)    Difficulty of Paying Living Expenses: Somewhat hard  Food Insecurity: No Food Insecurity (06/01/2021)   [facility]   Hunger Vital Sign    Within the past 12 months, you worried that your food would run out before you got the money to buy more.: Never true    Within the past 12 months, the food you bought just didn't last and you didn't have money to get more.: Never true  Transportation Needs: No Transportation Needs (06/01/2021)   Received from [facility]  PRAPARE - Administrator, Civil Service (Medical): No    Lack of Transportation (Non-Medical): No  Physical Activity: Sufficiently Active (05/13/2021)   Exercise Vital Sign    Days of Exercise per Week: 3 days    Minutes of Exercise per Session: 150+ min  Stress: Not on file  Social Connections: Socially Integrated (05/13/2021)   Social Connection and Isolation Panel    Frequency of Communication with Friends and Family: More than three times a week    Frequency of Social Gatherings with Friends and Family: Once a week    Attends Religious Services: More than 4 times per year    Active Member of [employer] or Organizations: Yes    Attends Engineer, structural: More than 4 times per year    Marital Status: Married  Catering manager Violence: Not At Risk (05/13/2021)    Humiliation, Afraid, Rape, and Kick questionnaire    Fear of Current or Ex-Partner: No    Emotionally Abused: No    Physically Abused: No    Sexually Abused: No    Family History  Problem Relation Age of Onset   Heart disease Mother    Heart disease Father     No Known Allergies  Outpatient Medications Prior to Visit  Medication Sig   Accu-Chek Softclix Lancets lancets Use as instructed   atorvastatin  (LIPITOR) 80 MG tablet Take 1 tablet (80 mg total) by mouth daily.   B Complex Vitamins (B COMPLEX-B12 PO) Take 1 tablet by mouth daily.   Cascara Sagrada 450 MG CAPS Take 450 mg by mouth 2 (two) times daily.   Cholecalciferol (VITAMIN D3) 1000 units CAPS Take 1,000 Units by mouth daily at 3 pm.   enalapril  (VASOTEC ) 2.5 MG tablet Take 1 tablet (2.5 mg total) by mouth daily.   glucose blood (ACCU-CHEK GUIDE TEST) test strip Use to check blood sugar TID (3 times daily)   meclizine  (ANTIVERT ) 25 MG tablet Take 1 tablet (25 mg total) by mouth 3 (three) times daily as needed for dizziness.   Melatonin 5 MG TABS Take 5 mg by mouth at bedtime.    pioglitazone  (ACTOS ) 30 MG tablet Take 1 tablet (30 mg total) by mouth daily.   tamsulosin  (FLOMAX ) 0.4 MG CAPS capsule Take 1 capsule (0.4 mg total) by mouth daily.   traZODone  (DESYREL ) 100 MG tablet Take 1 tablet (100 mg total) by mouth at bedtime.   warfarin (COUMADIN ) 5 MG tablet TAKE 5 MG ( 1 TABLET ) BY MOUTH  ALTERNATING WITH 7.5 MG ( 1.5  TABLETS ) EVERY OTHER DAY   No facility-administered medications prior to visit.    Review of Systems  Constitutional:  Positive for weight loss (2 lbs).  HENT: Negative.    Eyes: Negative.   Respiratory: Negative.    Gastrointestinal: Negative.   Genitourinary: Negative.   Musculoskeletal: Negative.   Skin: Negative.   Neurological:  Negative for dizziness.  Endo/Heme/Allergies: Negative.   Psychiatric/Behavioral: Negative.    All other systems reviewed and are negative.       Objective:   BP 113/77   Pulse 62   Temp 97.7 F (36.5 C)   Ht 5' 6 (1.676 m)   Wt 166 lb (75.3 kg)   SpO2 95%   BMI 26.79 kg/m   Vitals:   05/20/24 0957  BP: 113/77  Pulse: 62  Temp: 97.7 F (36.5 C)  Height: 5' 6 (1.676 m)  Weight: 166 lb (75.3 kg)  SpO2: 95%  BMI (Calculated): 26.81    Physical Exam Vitals reviewed.  Constitutional:      Appearance: Normal appearance. He is normal weight.  HENT:     Head: Normocephalic.     Nose: Nose normal.     Mouth/Throat:     Mouth: Mucous membranes are moist.  Eyes:     Pupils: Pupils are equal, round, and reactive to light.  Cardiovascular:     Rate and Rhythm: Normal rate and regular rhythm.     Pulses: Normal pulses.     Heart sounds: Normal heart sounds.  Pulmonary:     Effort: Pulmonary effort is normal.  Abdominal:     General: Abdomen is flat. Bowel sounds are normal.  Musculoskeletal:        General: Normal range of motion.     Cervical back: Normal range of motion.  Skin:    General: Skin is warm.  Neurological:     General: No focal deficit present.     Mental Status: He is alert.  Psychiatric:        Mood and Affect: Mood normal.      Results for orders placed or performed in visit on 05/20/24  POCT CBG (Fasting - Glucose)  Result Value Ref Range   Glucose Fasting, POC 164 (A) 70 - 99 mg/dL        Assessment & Plan:   Problem List Items Addressed This Visit       Cardiovascular and Mediastinum   Orthostatic hypotension     Endocrine   Controlled type 2 diabetes mellitus without complication, without long-term current use of insulin (HCC)     Genitourinary   CKD stage 3a, GFR 45-59 ml/min (HCC)   Relevant Orders   BMP8+Anion Gap   Comprehensive metabolic panel with GFR     Other   Mixed hyperlipidemia   Dizziness - Primary   Other Visit Diagnoses       Uncontrolled type 2 diabetes mellitus with hyperglycemia (HCC)       Relevant Orders   POCT CBG (Fasting - Glucose)  (Completed)       Problem List Items Addressed This Visit       Cardiovascular and Mediastinum   Orthostatic hypotension     Endocrine   Controlled type 2 diabetes mellitus without complication, without long-term current use of insulin (HCC)     Genitourinary   CKD stage 3a, GFR 45-59 ml/min (HCC)   Relevant Orders   BMP8+Anion Gap   Comprehensive metabolic panel with GFR     Other   Mixed hyperlipidemia   Dizziness - Primary   Other Visit Diagnoses       Uncontrolled type 2 diabetes mellitus with hyperglycemia (HCC)       Relevant Orders   POCT CBG (Fasting - Glucose) (Completed)       Return in about 2 months (around 07/20/2024) for fu with labs prior.   Total time spent: 20 minutes  Sherrill Cinderella Perry, MD  05/20/2024   This document may have been prepared by [facility] Voice Recognition software and as such may include unintentional dictation errors.

## 2024-05-21 LAB — PROTIME-INR
INR: 2.5 — ABNORMAL HIGH (ref 0.9–1.2)
Prothrombin Time: 26 s — ABNORMAL HIGH (ref 9.1–12.0)

## 2024-05-21 LAB — BMP8+ANION GAP
Anion Gap: 15 mmol/L (ref 10.0–18.0)
BUN/Creatinine Ratio: 17 (ref 10–24)
BUN: 22 mg/dL (ref 8–27)
CO2: 19 mmol/L — ABNORMAL LOW (ref 20–29)
Calcium: 9.3 mg/dL (ref 8.6–10.2)
Chloride: 108 mmol/L — ABNORMAL HIGH (ref 96–106)
Creatinine, Ser: 1.31 mg/dL — ABNORMAL HIGH (ref 0.76–1.27)
Glucose: 137 mg/dL — ABNORMAL HIGH (ref 70–99)
Potassium: 4.5 mmol/L (ref 3.5–5.2)
Sodium: 142 mmol/L (ref 134–144)
eGFR: 53 mL/min/1.73 — ABNORMAL LOW (ref >59)

## 2024-05-23 ENCOUNTER — Ambulatory Visit: Payer: Self-pay | Admitting: Internal Medicine

## 2024-05-26 ENCOUNTER — Ambulatory Visit: Payer: Self-pay

## 2024-06-27 ENCOUNTER — Other Ambulatory Visit: Payer: Self-pay | Admitting: Cardiovascular Disease

## 2024-06-27 ENCOUNTER — Other Ambulatory Visit

## 2024-06-27 LAB — HEMOGLOBIN A1C
Est. average glucose Bld gHb Est-mCnc: 143 mg/dL
Hgb A1c MFr Bld: 6.6 % — ABNORMAL HIGH (ref 4.8–5.6)

## 2024-06-28 LAB — LIPID PANEL
Chol/HDL Ratio: 3.1 ratio (ref 0.0–5.0)
Cholesterol, Total: 99 mg/dL — ABNORMAL LOW (ref 100–199)
HDL: 32 mg/dL — ABNORMAL LOW (ref >39)
LDL Chol Calc (NIH): 50 mg/dL (ref 0–99)
Triglycerides: 86 mg/dL (ref 0–149)
VLDL Cholesterol Cal: 17 mg/dL (ref 5–40)

## 2024-06-28 LAB — COMPREHENSIVE METABOLIC PANEL WITH GFR
ALT: 24 IU/L (ref 0–44)
AST: 32 IU/L (ref 0–40)
Albumin: 4 g/dL (ref 3.7–4.7)
Alkaline Phosphatase: 74 IU/L (ref 48–129)
BUN/Creatinine Ratio: 17 (ref 10–24)
BUN: 26 mg/dL (ref 8–27)
Bilirubin Total: 0.8 mg/dL (ref 0.0–1.2)
CO2: 21 mmol/L (ref 20–29)
Calcium: 9.3 mg/dL (ref 8.6–10.2)
Chloride: 106 mmol/L (ref 96–106)
Creatinine, Ser: 1.51 mg/dL — ABNORMAL HIGH (ref 0.76–1.27)
Globulin, Total: 2.5 g/dL (ref 1.5–4.5)
Glucose: 109 mg/dL — ABNORMAL HIGH (ref 70–99)
Potassium: 4.1 mmol/L (ref 3.5–5.2)
Sodium: 140 mmol/L (ref 134–144)
Total Protein: 6.5 g/dL (ref 6.0–8.5)
eGFR: 45 mL/min/1.73 — ABNORMAL LOW (ref >59)

## 2024-06-28 LAB — PROTIME-INR
INR: 2.1 — ABNORMAL HIGH (ref 0.9–1.2)
Prothrombin Time: 21.4 s — ABNORMAL HIGH (ref 9.1–12.0)

## 2024-07-01 ENCOUNTER — Ambulatory Visit (INDEPENDENT_AMBULATORY_CARE_PROVIDER_SITE_OTHER): Admitting: Internal Medicine

## 2024-07-01 ENCOUNTER — Encounter: Payer: Self-pay | Admitting: Internal Medicine

## 2024-07-01 ENCOUNTER — Ambulatory Visit: Payer: Self-pay

## 2024-07-01 VITALS — BP 119/79 | HR 72 | Temp 98.0°F | Ht 66.0 in | Wt 168.2 lb

## 2024-07-01 DIAGNOSIS — N50812 Left testicular pain: Secondary | ICD-10-CM

## 2024-07-01 DIAGNOSIS — N401 Enlarged prostate with lower urinary tract symptoms: Secondary | ICD-10-CM | POA: Diagnosis not present

## 2024-07-01 DIAGNOSIS — E119 Type 2 diabetes mellitus without complications: Secondary | ICD-10-CM | POA: Diagnosis not present

## 2024-07-01 DIAGNOSIS — N1831 Chronic kidney disease, stage 3a: Secondary | ICD-10-CM | POA: Diagnosis not present

## 2024-07-01 DIAGNOSIS — E782 Mixed hyperlipidemia: Secondary | ICD-10-CM | POA: Diagnosis not present

## 2024-07-01 DIAGNOSIS — H68101 Unspecified obstruction of Eustachian tube, right ear: Secondary | ICD-10-CM

## 2024-07-01 DIAGNOSIS — E041 Nontoxic single thyroid nodule: Secondary | ICD-10-CM | POA: Insufficient documentation

## 2024-07-01 LAB — POCT CBG (FASTING - GLUCOSE)-MANUAL ENTRY: Glucose Fasting, POC: 153 mg/dL — AB (ref 70–99)

## 2024-07-01 MED ORDER — MECLIZINE HCL 25 MG PO TABS: 25.0000 mg | ORAL_TABLET | Freq: Three times a day (TID) | ORAL | 2 refills | Status: AC | PRN

## 2024-07-01 NOTE — Progress Notes (Signed)
 Established Patient Office Visit  Subjective:  Patient ID: Kyle Santos, male    DOB: [birth year]  Age: 88 y.o. MRN: [id number]  Chief Complaint  Patient presents with  . Follow-up    6 week lab results    No new complaints, here for lab review and medication refills. Labs reviewed and notable for well controlled diabetes, A1c higher but remains at target, lipids at target with cmp notable for deterioration in gfr to which he admits poor fluid intake. Denies any hypoglycemic episodes and home bg readings have been at target. C/o shrinkage of left testicle with pian.     No other concerns at this time.   Past Medical History:  Diagnosis Date  . A-fib (HCC)   . Arthritis   . BPH (benign prostatic hyperplasia)   . Depression   . Heart murmur   . HOH (hard of hearing)   . Hypercholesteremia   . Hypertension   . Loss of equilibrium    being evaluated at this time.  . Pneumonia    history of  . Sleep apnea     Past Surgical History:  Procedure Laterality Date  . CATARACT EXTRACTION W/PHACO Right 01/09/2017   Procedure: CATARACT EXTRACTION PHACO AND INTRAOCULAR LENS PLACEMENT (IOC);  Surgeon: Jaye Fallow, MD;  Location: [facility] ORS;  Service: Ophthalmology;  Laterality: Right;  US  01:06 AP% 19.7 CDE 13.18 Fluid pack lot # [id number]  . CATARACT EXTRACTION W/PHACO Left 08/07/2017   Procedure: CATARACT EXTRACTION PHACO AND INTRAOCULAR LENS PLACEMENT (IOC)-LEFT;  Surgeon: Jaye Fallow, MD;  Location: [facility] ORS;  Service: Ophthalmology;  Laterality: Left;  US   00:43 AP% 15.0 CDE 6.50 Fluid pack lot # [id number]  . COLONOSCOPY    . JOINT REPLACEMENT     knee  . JOINT REPLACEMENT     shoulder    Social History   Socioeconomic History  . Marital status: Married    Spouse name: Rock  . Number of children: 3  . Years of education: College Degree  . Highest education level: Bachelor'Lysbeth Dicola degree (e.g., BA, AB, BS)  Occupational History  . Occupation: Retired  Tobacco  Use  . Smoking status: Former    Current packs/day: 0.00    Average packs/day: 0.5 packs/day for 7.0 years (3.5 ttl pk-yrs)    Types: Cigarettes    Start date: 08/21/1956    Quit date: 08/22/1963    Years since quitting: 60.9  . Smokeless tobacco: Never  Vaping Use  . Vaping status: Never Used  Substance and Sexual Activity  . Alcohol use: Yes    Alcohol/week: 0.0 standard drinks of alcohol    Comment: occasional  . Drug use: No  . Sexual activity: Not Currently    Birth control/protection: None  Other Topics Concern  . Not on file  Social History Narrative   Drinks about 2 cups of caffeine.   Social Drivers of Corporate Investment Banker Strain: Low Risk (06/01/2021)   Received from [facility]   Overall Financial Resource Strain (CARDIA)   . Difficulty of Paying Living Expenses: Not hard at all  Recent Concern: Financial Resource Strain - Medium Risk (05/13/2021)   Overall Financial Resource Strain (CARDIA)   . Difficulty of Paying Living Expenses: Somewhat hard  Food Insecurity: No Food Insecurity (06/01/2021)   Received from [facility]   Hunger Vital Sign   . Within the past 12 months, you worried that your food would run out before you got the money to  buy more.: Never true   . Within the past 12 months, the food you bought just didn't last and you didn't have money to get more.: Never true  Transportation Needs: No Transportation Needs (06/01/2021)   Received from [facility] - Transportation   . Lack of Transportation (Medical): No   . Lack of Transportation (Non-Medical): No  Physical Activity: Sufficiently Active (05/13/2021)   Exercise Vital Sign   . Days of Exercise per Week: 3 days   . Minutes of Exercise per Session: 150+ min  Stress: Not on file  Social Connections: Socially Integrated (05/13/2021)   Social Connection and Isolation Panel   . Frequency of Communication with Friends and Family: More than three times a week   . Frequency of  Social Gatherings with Friends and Family: Once a week   . Attends Religious Services: More than 4 times per year   . Active Member of Clubs or Organizations: Yes   . Attends Banker Meetings: More than 4 times per year   . Marital Status: Married  Catering Manager Violence: Not At Risk (05/13/2021)   Humiliation, Afraid, Rape, and Kick questionnaire   . Fear of Current or Ex-Partner: No   . Emotionally Abused: No   . Physically Abused: No   . Sexually Abused: No    Family History  Problem Relation Age of Onset  . Heart disease Mother   . Heart disease Father     No Known Allergies  Outpatient Medications Prior to Visit  Medication Sig  . Accu-Chek Softclix Lancets lancets Use as instructed  . atorvastatin  (LIPITOR) 80 MG tablet Take 1 tablet (80 mg total) by mouth daily.  . B Complex Vitamins (B COMPLEX-B12 PO) Take 1 tablet by mouth daily.  . Cascara Sagrada 450 MG CAPS Take 450 mg by mouth 2 (two) times daily.  . Cholecalciferol (VITAMIN D3) 1000 units CAPS Take 1,000 Units by mouth daily at 3 pm.  . enalapril  (VASOTEC ) 2.5 MG tablet Take 1 tablet (2.5 mg total) by mouth daily.  SABRA glucose blood (ACCU-CHEK GUIDE TEST) test strip Use to check blood sugar TID (3 times daily)  . meclizine  (ANTIVERT ) 25 MG tablet Take 1 tablet (25 mg total) by mouth 3 (three) times daily as needed for dizziness.  . Melatonin 5 MG TABS Take 5 mg by mouth at bedtime.   . pioglitazone  (ACTOS ) 30 MG tablet Take 1 tablet (30 mg total) by mouth daily.  . tamsulosin  (FLOMAX ) 0.4 MG CAPS capsule Take 1 capsule (0.4 mg total) by mouth daily.  . traZODone  (DESYREL ) 100 MG tablet Take 1 tablet (100 mg total) by mouth at bedtime.  SABRA warfarin (COUMADIN ) 5 MG tablet TAKE 5 MG ( 1 TABLET ) BY MOUTH  ALTERNATING WITH 7.5 MG ( 1.5  TABLETS ) EVERY OTHER DAY   No facility-administered medications prior to visit.    Review of Systems  Constitutional:  Negative for weight loss.  HENT: Negative.     Eyes: Negative.   Respiratory: Negative.    Gastrointestinal: Negative.   Genitourinary:        Left testicular pain and shrinkage  Musculoskeletal: Negative.   Skin: Negative.   Neurological:  Negative for dizziness.  Endo/Heme/Allergies: Negative.   Psychiatric/Behavioral: Negative.    All other systems reviewed and are negative.      Objective:   BP 119/79   Pulse 72   Temp 98 F (36.7 C)   Ht 5' 6 (  1.676 m)   Wt 168 lb 3.2 oz (76.3 kg)   SpO2 98%   BMI 27.15 kg/m   Vitals:   07/01/24 1106  BP: 119/79  Pulse: 72  Temp: 98 F (36.7 C)  Height: 5' 6 (1.676 m)  Weight: 168 lb 3.2 oz (76.3 kg)  SpO2: 98%  BMI (Calculated): 27.16    Physical Exam Vitals reviewed.  Constitutional:      Appearance: Normal appearance. He is normal weight.  HENT:     Head: Normocephalic.     Nose: Nose normal.     Mouth/Throat:     Mouth: Mucous membranes are moist.  Eyes:     Pupils: Pupils are equal, round, and reactive to light.  Cardiovascular:     Rate and Rhythm: Normal rate and regular rhythm.     Pulses: Normal pulses.     Heart sounds: Normal heart sounds.  Pulmonary:     Effort: Pulmonary effort is normal.  Abdominal:     General: Abdomen is flat. Bowel sounds are normal.  Musculoskeletal:        General: Normal range of motion.     Cervical back: Normal range of motion.  Skin:    General: Skin is warm.  Neurological:     General: No focal deficit present.     Mental Status: He is alert.  Psychiatric:        Mood and Affect: Mood normal.      Results for orders placed or performed in visit on 07/01/24  POCT CBG (Fasting - Glucose)  Result Value Ref Range   Glucose Fasting, POC 153 (A) 70 - 99 mg/dL    Recent Results (from the past 2160 hours)  APTT     Status: None   Collection Time: 04/04/24  1:05 PM  Result Value Ref Range   aPTT 29 24 - 33 sec    Comment: This test has not been validated for monitoring unfractionated heparin therapy.  aPTT-based therapeutic ranges for unfractionated heparin therapy have not been established. For general guidelines on Heparin monitoring, refer to the [employer].   Protime-INR     Status: Abnormal   Collection Time: 04/04/24  1:05 PM  Result Value Ref Range   INR 1.5 (H) 0.9 - 1.2    Comment: Reference interval is for non-anticoagulated patients. Suggested INR therapeutic range for Vitamin K antagonist therapy:    Standard Dose (moderate intensity                   therapeutic range):       2.0 - 3.0    Higher intensity therapeutic range       2.5 - 3.5    Prothrombin Time 15.9 (H) 9.1 - 12.0 sec  Protime-INR     Status: Abnormal   Collection Time: 05/02/24  1:13 PM  Result Value Ref Range   INR 2.9 (H) 0.9 - 1.2    Comment: Reference interval is for non-anticoagulated patients. Suggested INR therapeutic range for Vitamin K antagonist therapy:    Standard Dose (moderate intensity                   therapeutic range):       2.0 - 3.0    Higher intensity therapeutic range       2.5 - 3.5    Prothrombin Time 29.3 (H) 9.1 - 12.0 sec  BMP8+Anion Gap     Status: Abnormal   Collection Time: 05/02/24  1:14 PM  Result Value Ref Range   Glucose 98 70 - 99 mg/dL   BUN 22 8 - 27 mg/dL   Creatinine, Ser 8.66 (H) 0.76 - 1.27 mg/dL   eGFR 52 (L) >40 fO/fpw/8.26   BUN/Creatinine Ratio 17 10 - 24   Sodium 141 134 - 144 mmol/L   Potassium 5.0 3.5 - 5.2 mmol/L   Chloride 108 (H) 96 - 106 mmol/L   CO2 20 20 - 29 mmol/L   Anion Gap 13.0 10.0 - 18.0 mmol/L   Calcium  9.0 8.6 - 10.2 mg/dL  CBC With Diff/Platelet     Status: Abnormal   Collection Time: 05/02/24  1:14 PM  Result Value Ref Range   WBC 6.2 3.4 - 10.8 x10E3/uL   RBC 3.47 (L) 4.14 - 5.80 x10E6/uL   Hemoglobin 11.4 (L) 13.0 - 17.7 g/dL   Hematocrit 65.5 (L) 62.4 - 51.0 %   MCV 99 (H) 79 - 97 fL   MCH 32.9 26.6 - 33.0 pg   MCHC 33.1 31.5 - 35.7 g/dL   RDW 87.4 88.3 - 84.5 %   Platelets 137 (L) 150 - 450  x10E3/uL   Neutrophils 59 Not Estab. %   Lymphs 30 Not Estab. %   Monocytes 8 Not Estab. %   Eos 2 Not Estab. %   Basos 1 Not Estab. %   Neutrophils Absolute 3.6 1.4 - 7.0 x10E3/uL   Lymphocytes Absolute 1.9 0.7 - 3.1 x10E3/uL   Monocytes Absolute 0.5 0.1 - 0.9 x10E3/uL   EOS (ABSOLUTE) 0.1 0.0 - 0.4 x10E3/uL   Basophils Absolute 0.0 0.0 - 0.2 x10E3/uL   Immature Granulocytes 0 Not Estab. %   Immature Grans (Abs) 0.0 0.0 - 0.1 x10E3/uL  POCT CBG (Fasting - Glucose)     Status: Abnormal   Collection Time: 05/20/24 10:02 AM  Result Value Ref Range   Glucose Fasting, POC 164 (A) 70 - 99 mg/dL  AFE1+Jwpnw Gap     Status: Abnormal   Collection Time: 05/20/24 10:38 AM  Result Value Ref Range   Glucose 137 (H) 70 - 99 mg/dL   BUN 22 8 - 27 mg/dL   Creatinine, Ser 8.68 (H) 0.76 - 1.27 mg/dL   eGFR 53 (L) >40 fO/fpw/8.26   BUN/Creatinine Ratio 17 10 - 24   Sodium 142 134 - 144 mmol/L   Potassium 4.5 3.5 - 5.2 mmol/L   Chloride 108 (H) 96 - 106 mmol/L   CO2 19 (L) 20 - 29 mmol/L   Anion Gap 15.0 10.0 - 18.0 mmol/L   Calcium  9.3 8.6 - 10.2 mg/dL  Protime-INR     Status: Abnormal   Collection Time: 05/20/24 10:39 AM  Result Value Ref Range   INR 2.5 (H) 0.9 - 1.2    Comment: Reference interval is for non-anticoagulated patients. Suggested INR therapeutic range for Vitamin K antagonist therapy:    Standard Dose (moderate intensity                   therapeutic range):       2.0 - 3.0    Higher intensity therapeutic range       2.5 - 3.5    Prothrombin Time 26.0 (H) 9.1 - 12.0 sec  Lipid panel     Status: Abnormal   Collection Time: 06/27/24  9:14 AM  Result Value Ref Range   Cholesterol, Total 99 (L) 100 - 199 mg/dL   Triglycerides 86 0 - 149 mg/dL   HDL 32 (  L) >39 mg/dL   VLDL Cholesterol Cal 17 5 - 40 mg/dL   LDL Chol Calc (NIH) 50 0 - 99 mg/dL   Chol/HDL Ratio 3.1 0.0 - 5.0 ratio    Comment:                                   T. Chol/HDL Ratio                                              Men  Women                               1/2 Avg.Risk  3.4    3.3                                   Avg.Risk  5.0    4.4                                2X Avg.Risk  9.6    7.1                                3X Avg.Risk 23.4   11.0   Hemoglobin A1c     Status: Abnormal   Collection Time: 06/27/24  9:16 AM  Result Value Ref Range   Hgb A1c MFr Bld 6.6 (H) 4.8 - 5.6 %    Comment:          Prediabetes: 5.7 - 6.4          Diabetes: >6.4          Glycemic control for adults with diabetes: <7.0    Est. average glucose Bld gHb Est-mCnc 143 mg/dL  Comprehensive metabolic panel with GFR     Status: Abnormal   Collection Time: 06/27/24  9:17 AM  Result Value Ref Range   Glucose 109 (H) 70 - 99 mg/dL   BUN 26 8 - 27 mg/dL   Creatinine, Ser 8.48 (H) 0.76 - 1.27 mg/dL   eGFR 45 (L) >40 fO/fpw/8.26   BUN/Creatinine Ratio 17 10 - 24   Sodium 140 134 - 144 mmol/L   Potassium 4.1 3.5 - 5.2 mmol/L   Chloride 106 96 - 106 mmol/L   CO2 21 20 - 29 mmol/L   Calcium  9.3 8.6 - 10.2 mg/dL   Total Protein 6.5 6.0 - 8.5 g/dL   Albumin 4.0 3.7 - 4.7 g/dL   Globulin, Total 2.5 1.5 - 4.5 g/dL   Bilirubin Total 0.8 0.0 - 1.2 mg/dL   Alkaline Phosphatase 74 48 - 129 IU/L   AST 32 0 - 40 IU/L   ALT 24 0 - 44 IU/L  Protime-INR     Status: Abnormal   Collection Time: 06/27/24  9:17 AM  Result Value Ref Range   INR 2.1 (H) 0.9 - 1.2    Comment: Reference interval is for non-anticoagulated patients. Suggested INR therapeutic range for Vitamin K antagonist therapy:    Standard Dose (moderate intensity  therapeutic range):       2.0 - 3.0    Higher intensity therapeutic range       2.5 - 3.5    Prothrombin Time 21.4 (H) 9.1 - 12.0 sec  POCT CBG (Fasting - Glucose)     Status: Abnormal   Collection Time: 07/01/24 11:13 AM  Result Value Ref Range   Glucose Fasting, POC 153 (A) 70 - 99 mg/dL      Assessment & Plan:   Problem List Items Addressed This Visit       Endocrine    Controlled type 2 diabetes mellitus without complication, without long-term current use of insulin (HCC) - Primary   Relevant Orders   POCT CBG (Fasting - Glucose) (Completed)     Genitourinary   CKD stage 3a, GFR 45-59 ml/min (HCC)     Other   Mixed hyperlipidemia    No follow-ups on file.   Total time spent: 30 minutes. This time includes review of previous notes and results and patient face to face interaction during today'Rodert Hinch visit.    Sherrill Cinderella Perry, MD  07/01/2024   This document may have been prepared by [facility] Voice Recognition software and as such may include unintentional dictation errors.

## 2024-07-10 ENCOUNTER — Encounter: Payer: Self-pay | Admitting: Cardiovascular Disease

## 2024-07-10 ENCOUNTER — Ambulatory Visit: Admitting: Cardiovascular Disease

## 2024-07-10 VITALS — BP 121/78 | HR 72 | Ht 66.0 in | Wt 167.8 lb

## 2024-07-10 DIAGNOSIS — I10 Essential (primary) hypertension: Secondary | ICD-10-CM | POA: Diagnosis not present

## 2024-07-10 DIAGNOSIS — I480 Paroxysmal atrial fibrillation: Secondary | ICD-10-CM

## 2024-07-10 DIAGNOSIS — I2510 Atherosclerotic heart disease of native coronary artery without angina pectoris: Secondary | ICD-10-CM | POA: Diagnosis not present

## 2024-07-10 DIAGNOSIS — N1831 Chronic kidney disease, stage 3a: Secondary | ICD-10-CM | POA: Diagnosis not present

## 2024-07-10 DIAGNOSIS — E1165 Type 2 diabetes mellitus with hyperglycemia: Secondary | ICD-10-CM

## 2024-07-10 NOTE — Progress Notes (Signed)
 Cardiology Office Note   Date:  07/10/2024   ID:  Kyle Santos, DOB Jul 16, 1936, MRN [id number]  PCP:  Albina GORMAN Dine, MD  Cardiologist:  Denyse Bathe, MD      History of Present Illness: Kyle Santos is a 88 y.o. male who presents for  Chief Complaint  Patient presents with   Follow-up    2 month follow up    DOING BETTER, COUGH OCCASIONALLY.      Past Medical History:  Diagnosis Date   A-fib [facility])    Arthritis    BPH (benign prostatic hyperplasia)    Depression    Heart murmur    HOH (hard of hearing)    Hypercholesteremia    Hypertension    Loss of equilibrium    being evaluated at this time.   Pneumonia    history of   Sleep apnea      Past Surgical History:  Procedure Laterality Date   CATARACT EXTRACTION W/PHACO Right 01/09/2017   Procedure: CATARACT EXTRACTION PHACO AND INTRAOCULAR LENS PLACEMENT (IOC);  Surgeon: Jaye Fallow, MD;  Location: [facility] ORS;  Service: Ophthalmology;  Laterality: Right;  US  01:06 AP% 19.7 CDE 13.18 Fluid pack lot # [id number]   CATARACT EXTRACTION W/PHACO Left 08/07/2017   Procedure: CATARACT EXTRACTION PHACO AND INTRAOCULAR LENS PLACEMENT (IOC)-LEFT;  Surgeon: Jaye Fallow, MD;  Location: [facility] ORS;  Service: Ophthalmology;  Laterality: Left;  US   00:43 AP% 15.0 CDE 6.50 Fluid pack lot # [id number]   COLONOSCOPY     JOINT REPLACEMENT     knee   JOINT REPLACEMENT     shoulder     Current Outpatient Medications  Medication Sig Dispense Refill   Accu-Chek Softclix Lancets lancets Use as instructed 100 each 12   atorvastatin  (LIPITOR) 80 MG tablet Take 1 tablet (80 mg total) by mouth daily. 90 tablet 3   B Complex Vitamins (B COMPLEX-B12 PO) Take 1 tablet by mouth daily.     [place] 450 MG CAPS Take 450 mg by mouth 2 (two) times daily.     Cholecalciferol (VITAMIN D3) 1000 units CAPS Take 1,000 Units by mouth daily at 3 pm.     enalapril  (VASOTEC ) 2.5 MG tablet Take 1 tablet (2.5 mg total) by  mouth daily. 90 tablet 3   glucose blood (ACCU-CHEK GUIDE TEST) test strip Use to check blood sugar TID (3 times daily) 100 each 12   meclizine  (ANTIVERT ) 25 MG tablet Take 1 tablet (25 mg total) by mouth 3 (three) times daily as needed for dizziness. 30 tablet 2   Melatonin 5 MG TABS Take 5 mg by mouth at bedtime.      pioglitazone  (ACTOS ) 30 MG tablet Take 1 tablet (30 mg total) by mouth daily. 90 tablet 1   tamsulosin  (FLOMAX ) 0.4 MG CAPS capsule Take 1 capsule (0.4 mg total) by mouth daily. 90 capsule 1   traZODone  (DESYREL ) 100 MG tablet Take 1 tablet (100 mg total) by mouth at bedtime. 90 tablet 1   warfarin (COUMADIN ) 5 MG tablet TAKE 5 MG ( 1 TABLET ) BY MOUTH  ALTERNATING WITH 7.5 MG ( 1.5  TABLETS ) EVERY OTHER DAY 35 tablet 11   No current facility-administered medications for this visit.    Allergies:   Patient has no known allergies.    Social History:   reports that he quit smoking about 60 years ago. His smoking use included cigarettes. He started smoking about 67 years ago. He has  a 3.5 pack-year smoking history. He has never used smokeless tobacco. He reports current alcohol use. He reports that he does not use drugs.   Family History:  family history includes Heart disease in his father and mother.    ROS:     Review of Systems  Constitutional: Negative.   HENT: Negative.    Eyes: Negative.   Respiratory: Negative.    Gastrointestinal: Negative.   Genitourinary: Negative.   Musculoskeletal: Negative.   Skin: Negative.   Neurological: Negative.   Endo/Heme/Allergies: Negative.   Psychiatric/Behavioral: Negative.    All other systems reviewed and are negative.     All other systems are reviewed and negative.    PHYSICAL EXAM: VS:  BP 121/78   Pulse 72   Ht 5' 6 (1.676 m)   Wt 167 lb 12.8 oz (76.1 kg)   SpO2 94%   BMI 27.08 kg/m  , BMI Body mass index is 27.08 kg/m. Last weight:  Wt Readings from Last 3 Encounters:  07/10/24 167 lb 12.8 oz (76.1 kg)   07/01/24 168 lb 3.2 oz (76.3 kg)  05/20/24 166 lb (75.3 kg)     Physical Exam Vitals reviewed.  Constitutional:      Appearance: Normal appearance. He is normal weight.  HENT:     Head: Normocephalic.     Nose: Nose normal.     Mouth/Throat:     Mouth: Mucous membranes are moist.  Eyes:     Pupils: Pupils are equal, round, and reactive to light.  Cardiovascular:     Rate and Rhythm: Normal rate and regular rhythm.     Pulses: Normal pulses.     Heart sounds: Normal heart sounds.  Pulmonary:     Effort: Pulmonary effort is normal.  Abdominal:     General: Abdomen is flat. Bowel sounds are normal.  Musculoskeletal:        General: Normal range of motion.     Cervical back: Normal range of motion.  Skin:    General: Skin is warm.  Neurological:     General: No focal deficit present.     Mental Status: He is alert.  Psychiatric:        Mood and Affect: Mood normal.       EKG:   Recent Labs: 05/02/2024: Hemoglobin 11.4; Platelets 137 06/27/2024: ALT 24; BUN 26; Creatinine, Ser 1.51; Potassium 4.1; Sodium 140    Lipid Panel    Component Value Date/Time   CHOL 99 (L) 06/27/2024 0914   TRIG 86 06/27/2024 0914   HDL 32 (L) 06/27/2024 0914   CHOLHDL 3.1 06/27/2024 0914   LDLCALC 50 06/27/2024 0914      Other studies Reviewed: Additional studies/ records that were reviewed today include:  Review of the above records demonstrates:       No data to display            ASSESSMENT AND PLAN:    ICD-10-CM   1. CKD stage 3a, GFR 45-59 ml/min (HCC)  N18.31     2. Coronary artery disease involving native coronary artery of native heart without angina pectoris  I25.10     3. Paroxysmal atrial fibrillation (HCC)  I48.0     4. Essential hypertension  I10    BP STABLE    5. Uncontrolled type 2 diabetes mellitus with hyperglycemia (HCC)  E11.65        Problem List Items Addressed This Visit       Cardiovascular and Mediastinum  Atrial fibrillation (HCC)    Essential hypertension   Coronary artery disease involving native coronary artery of native heart     Genitourinary   CKD stage 3a, GFR 45-59 ml/min (HCC) - Primary   Other Visit Diagnoses       Uncontrolled type 2 diabetes mellitus with hyperglycemia (HCC)              Disposition:   Return in about 3 months (around 10/10/2024).    Total time spent: 30 minutes  Signed,  Denyse Bathe, MD  07/10/2024 10:23 AM    [facility]

## 2024-09-02 ENCOUNTER — Other Ambulatory Visit: Payer: Self-pay | Admitting: Internal Medicine

## 2024-09-02 DIAGNOSIS — E119 Type 2 diabetes mellitus without complications: Secondary | ICD-10-CM

## 2024-09-30 ENCOUNTER — Ambulatory Visit: Admitting: Internal Medicine

## 2024-10-09 ENCOUNTER — Ambulatory Visit: Admitting: Cardiovascular Disease
